# Patient Record
Sex: Male | Born: 1935 | Race: White | Hispanic: No | Smoking: Former smoker
Health system: Southern US, Community
[De-identification: ages and names within clinical notes are randomized; demographics above are authoritative.]

## PROBLEM LIST (undated history)

## (undated) DIAGNOSIS — G4730 Sleep apnea, unspecified: Secondary | ICD-10-CM

## (undated) DIAGNOSIS — M1990 Unspecified osteoarthritis, unspecified site: Secondary | ICD-10-CM

## (undated) DIAGNOSIS — K449 Diaphragmatic hernia without obstruction or gangrene: Secondary | ICD-10-CM

## (undated) DIAGNOSIS — IMO0001 Reserved for inherently not codable concepts without codable children: Secondary | ICD-10-CM

## (undated) DIAGNOSIS — N400 Enlarged prostate without lower urinary tract symptoms: Secondary | ICD-10-CM

## (undated) DIAGNOSIS — K219 Gastro-esophageal reflux disease without esophagitis: Secondary | ICD-10-CM

## (undated) HISTORY — DX: Reserved for inherently not codable concepts without codable children: IMO0001

## (undated) HISTORY — DX: Gastro-esophageal reflux disease without esophagitis: K21.9

## (undated) HISTORY — PX: EYE SURGERY: SHX253

## (undated) HISTORY — DX: Benign prostatic hyperplasia without lower urinary tract symptoms: N40.0

## (undated) HISTORY — DX: Diaphragmatic hernia without obstruction or gangrene: K44.9

---

## 1997-12-12 HISTORY — PX: RETINAL LASER PROCEDURE: SHX2339

## 1998-12-12 HISTORY — PX: SHOULDER SURGERY: SHX246

## 2001-01-22 ENCOUNTER — Ambulatory Visit (HOSPITAL_BASED_OUTPATIENT_CLINIC_OR_DEPARTMENT_OTHER): Admission: RE | Admit: 2001-01-22 | Discharge: 2001-01-22 | Payer: Self-pay | Admitting: Orthopedic Surgery

## 2001-02-19 ENCOUNTER — Ambulatory Visit (HOSPITAL_BASED_OUTPATIENT_CLINIC_OR_DEPARTMENT_OTHER): Admission: RE | Admit: 2001-02-19 | Discharge: 2001-02-19 | Payer: Self-pay | Admitting: *Deleted

## 2008-12-12 HISTORY — PX: CHOLECYSTECTOMY: SHX55

## 2010-01-07 ENCOUNTER — Encounter: Admission: RE | Admit: 2010-01-07 | Discharge: 2010-01-07 | Payer: Self-pay | Admitting: Orthopedic Surgery

## 2010-02-02 ENCOUNTER — Ambulatory Visit: Payer: Self-pay | Admitting: Cardiovascular Disease

## 2011-04-26 NOTE — Letter (Signed)
February 02, 2010    Robert A. Thurston Hole, M.D.  [address]  [place], [place] [address]   RE:  Kyle Torres, Kyle Torres  MRN:  [id number]  /  DOB:  1934/04/13   Dear Dr. Thurston Hole:   I had the pleasure of seeing your patient, Kyle Torres, in  Cardiology Clinic this morning.  As you know, he is a 77 year old  gentleman who recently tore his medial and lateral menisci.  He had an  echocardiogram within the past month which showed a normal ejection  fraction and no significant valvular abnormalities.  His EKG today in my  office was completely normal, and he has been asymptomatic from a  cardiac standpoint.  I feel he can proceed with his knee arthroscopy and  be at low risk for the procedure.  Thank you for the referral of Kyle Torres.  Please contact my office if I can be of any further  assistance.    Sincerely,      Kyle El, MD  Electronically Signed    SGA/MedQ  DD: 02/02/2010  DT: 02/02/2010  Job #: [id number]

## 2011-04-26 NOTE — Assessment & Plan Note (Signed)
[facility]                        [facility] OFFICE NOTE   NAME:WILLIAMSShadow, Schedler                     MRN:          [id number]  DATE:02/02/2010                            DOB:          [birth year]    CHIEF COMPLAINT:  Preoperative evaluation prior to a knee procedure.   HISTORY OF PRESENT ILLNESS:  Mr. Bachus is a 77 year old white male  with past medical history significant for BPH, GERD, asthma who was  presenting for evaluation prior to an arthroscopic right knee procedure.  The patient states that he had recently fallen landing on his right  knee.  A subsequent MRI showed tears of the medial and lateral meniscus.  The patient denies any cardiac history.  He has his cholesterol checked  regularly and he states it is within normal limits.  He states his blood  pressure tends to run a little high and that the systolic blood  pressures is usually in the 150s.  Prior to the knee injury, he was  walking approximately a mile or two each day without any difficulty.  Specifically, he denies any chest discomfort, any recent change in  degree of dyspnea on exertion.  He also denies any significant edema,  dizziness or syncope.   PAST MEDICAL HISTORY:  As above.   SOCIAL HISTORY:  The patient used to smoke, but quit 30 years ago.  He  does not drink alcohol.   FAMILY HISTORY:  Negative for premature coronary artery disease.   ALLERGIES:  No known drug allergies.   MEDICATIONS:  1. Aspirin 81 mg daily.  2. Terazosin 2 mg b.i.d.  3. Omeprazole 20 mg b.i.d.  4. Finasteride 5 mg daily.  5. Multivitamin.  6. Zinc.  7. Asmanex.  8. Flunisolide nasal spray.   REVIEW OF SYSTEMS:  The patient endorses an approximately 9-pound weight  gain over the past couple of months secondary to a decreased physical  activity and increased eating.  Other systems as in HPI, otherwise  negative.   PHYSICAL EXAMINATION:  VITAL SIGNS:  Blood pressure is 158/91  in the  right arm, 151/77 in the left arm, pulse is 71, sating 94% on room air,  he weighs 247 pounds.  GENERAL:  No acute distress.  HEENT:  Normocephalic, atraumatic.  NECK:  Supple.  There is no JVD.  There are no carotid bruits.  HEART:  Regular rate and rhythm without murmur, rub or gallop.  LUNGS:  Clear bilaterally.  ABDOMEN:  Soft, nontender, nondistended.  EXTREMITIES:  Without edema.  SKIN:  Warm and dry.  MUSCULOSKELETAL:  5/5 bilateral upper and lower extremity strength.  NEURO:  Nonfocal.  PSYCHIATRIC:  The patient is appropriate with normal levels of insight.   EKG taken today independently interpreted by myself demonstrates normal  sinus rhythm without any ST or T-wave abnormalities.   Echocardiogram dated January 04, 2010, shows a normal ejection fraction  and no significant valvular regurgitation.  There was some mild LVH and  diastolic dysfunction.   ASSESSMENT AND PLAN:  A 77 year old white male with hypertension who is  not having any symptoms consistent with angina, heart  failure or  arrhythmia.  The patient has had an echocardiogram that demonstrates  normal left ventricular systolic function and his EKG is completely  within normal limits.  At this point, I feel that he can proceed with  his surgery and be at low risk for the procedure.  I spoke with him  about beginning an antihypertensive agent and the patient declines this  at this time and states he would like to make a sincere effort at weight  loss to see if that will have the desired effect on his blood pressure.  Therefore, we will see him back in 1 months' time.  At that time if his  blood pressure has not improved, we will likely start antihypertensive  therapy in the form of HCTZ/lisinopril.     Brayton El, MD  Electronically Signed    SGA/MedQ  DD: 02/02/2010  DT: 02/03/2010  Job #: [id number]

## 2011-04-29 NOTE — Op Note (Signed)
[facility] [place]  Patient:    Kyle Torres, Kyle Torres                     MRN: [id number] Proc. Date: 02/19/01 Adm. Date:  [id number] Disc. Date: [id number] Attending:  Twana First                           Operative Report  PREOPERATIVE DIAGNOSIS:  Right shoulder rotator cuff tear.  POSTOPERATIVE DIAGNOSES: 1. Right shoulder rotator cuff tear. 2. Right shoulder partial labrum tear. 3. Right shoulder impingement. 4. Right shoulder acromioclavicular joint spurring.  PROCEDURE: 1. Right shoulder examination under anesthesia followed by arthroscopic    partial labrum tear debridement. 2. Right shoulder subacromial decompression. 3. Right shoulder arthroscopically assisted rotator cuff repair. 4. Right shoulder distal clavicle excision.  SURGEON:  Dr. Salvatore Marvel.  ASSISTANT:  Oris Drone. Petrarca, P.A.  ANESTHESIA:  General.  OPERATIVE TIME:  One hour and 30 minutes.  COMPLICATIONS:  None.  INDICATIONS FOR PROCEDURE:  Mr. Loving is a 77 year old gentleman who has had significant right shoulder pain for the past year, much worse over the past three months with signs and symptoms consistent with rotator cuff tear versus tendonitis impingement and AC joint spurring and is now to undergo arthroscopy and rotator cuff repair and decompression.  DESCRIPTION OF PROCEDURE:  Mr. Hawker was brought to the operating room on February 19, 2001 and placed on the operating table in supine position.  After adequate level of general anesthesia was obtained, his right shoulder was examined under anesthesia.  He had full range of motion and his shoulder was stable to ligamentous exam.  He was then placed in the beach chair position. His shoulder and arm were prepped using sterile Betadine and draped using sterile technique.  Originally, the arthroscopy was performed to a posterior arthroscopic portal.  The arthroscope with a pump attached was placed into  an anterior portal and arthroscopic probe was placed.  On initial inspection the articular cartilage in the glenohumeral joint was found to be intact.  The anterior labrum had partial tearing, 25-30% superiorly which was debrided. The biceps tendon anchor was intact.  The middle anterior labrum and inferior labrum was intact.  The anterior and glenohumeral ligaments were intact.  The biceps had a significant partial tearing, 30-40% which was debrided, but the rest was intact.  Posterior labrum was intact.  Rotator cuff showed a complete tear of the supraspinatus and a partial tear of the infraspinatus, and this was partially debrided arthroscopically.  A lateral portal was made and a subacromial decompression was carried out, removing 6-8 mm of the under surface of the anterior, anterolateral, and anteromedial acromium and CA ligament release carried out as well.  At this point then, using both the anterior and posterior portal, two separate #1 Panacryl sutures were placed from back to front across the apex of the rotator cuff tear to be tied later. After these were passed, then an Arthrex bioabsorbable suture anchor was placed in the greater tuberosity and then each limb of this suture was passed, one through the anterior and one through the posterior leaflet of the tear. After all the sutures had been passed, then sequentially the tendon to tendon sutures were tied tightly with a sliding locking knot and then the rotator cuff anchor sutures were tied, first anterior and then posterior sutures tied down, thus resecuring this portion of the tear back down to  the greater tuberosity.  After this was done, the shoulder could be brought to a full range of motion with no impingement on the repair.  At this point, the [facility] joint and distal clavicle was exposed.  Significant spurring noted in this joint, and a 6 mm bur was used to resect the distal 4-5 mm of the clavicle and spurring under the  distal clavicle resected as well.  At this point then it was felt that all pathology had been satisfactorily addressed.  A subacromial pain catheter was placed in the subacromial space for postoperative pain.  The arthroscopic portals were then closed with interrupted 3-0 nylon suture after the instruments had been removed.  Sterile dressings were applied in a sling and then the patient wakened and taken to recovery room in stable condition.  FOLLOW-UP CARE:  Mr. Hammonds will be followed overnight at the recovery care center for IV pain control, Norvasc monitoring.  He will be discharged tomorrow on Percocet and Naprosyn.  He will remove his pain pump and bandages in two days per our instructions.  Begin early physical therapy.  He will be seen back in the office in a week for sutures out and follow-up. DD:  02/19/01 TD:  02/19/01 Job: [id number]

## 2011-08-26 ENCOUNTER — Other Ambulatory Visit: Payer: Self-pay | Admitting: Neurosurgery

## 2011-08-26 DIAGNOSIS — M549 Dorsalgia, unspecified: Secondary | ICD-10-CM

## 2011-09-01 ENCOUNTER — Encounter: Payer: Self-pay | Admitting: Cardiovascular Disease

## 2011-09-02 ENCOUNTER — Other Ambulatory Visit: Payer: Self-pay

## 2011-10-10 ENCOUNTER — Encounter (HOSPITAL_COMMUNITY): Payer: Self-pay

## 2011-10-10 ENCOUNTER — Encounter (HOSPITAL_COMMUNITY)
Admission: RE | Admit: 2011-10-10 | Discharge: 2011-10-10 | Disposition: A | Discharge status: Home / Self Care | Payer: Medicare Other | Source: Ambulatory Visit | Attending: Neurosurgery | Admitting: Neurosurgery

## 2011-10-10 HISTORY — DX: Unspecified osteoarthritis, unspecified site: M19.90

## 2011-10-10 HISTORY — DX: Sleep apnea, unspecified: G47.30

## 2011-10-10 LAB — CBC
Hemoglobin: 13.6 g/dL (ref 13.0–17.0)
Platelets: 204 10*3/uL (ref 150–400)
RBC: 4.17 MIL/uL — ABNORMAL LOW (ref 4.22–5.81)
WBC: 8.4 10*3/uL (ref 4.0–10.5)

## 2011-10-10 NOTE — Pre-Procedure Instructions (Signed)
[address] Kyle Torres  10/10/2011   Your procedure is scheduled on:  10/19/11 Wednesday  Report to [facility] at 1015 AM.  Call this number if you have problems the morning of surgery: [phone]   Remember:   Do not eat food:After Midnight.  Do not drink clear liquids: 4 Hours before arrival.0615  Take these medicines the morning of surgery with A SIP OF WATER: Proventil inhaler,asthmanex,omeprazole   Do not wear jewelry, make-up or nail polish.  Do not wear lotions, powders, or perfumes. You may wear deodorant.  Do not shave 48 hours prior to surgery.  Do not bring valuables to the hospital.  Contacts, dentures or bridgework may not be worn into surgery.  Leave suitcase in the car. After surgery it may be brought to your room.  For patients admitted to the hospital, checkout time is 11:00 AM the day of discharge.   Patients discharged the day of surgery will not be allowed to drive home.  Name and phone number of your driver:Kyle Torres -wife [phone]  Special Instructions: CHG Shower Use Special Wash: 1/2 bottle night before surgery and 1/2 bottle morning of surgery.   Please read over the following fact sheets that you were given: Pain Booklet, Coughing and Deep Breathing, Open Heart Packet, MRSA Information and Surgical Site Infection Prevention

## 2011-10-18 ENCOUNTER — Encounter (HOSPITAL_COMMUNITY): Payer: Self-pay

## 2011-10-18 MED ORDER — CEFAZOLIN SODIUM 1-5 GM-% IV SOLN
1.0000 g | INTRAVENOUS | Status: DC
  Filled 2011-10-18: qty 50

## 2011-10-19 ENCOUNTER — Inpatient Hospital Stay (HOSPITAL_COMMUNITY)
Admission: RE | Admit: 2011-10-19 | Discharge: 2011-10-21 | DRG: 472 | Disposition: A | Discharge status: Home / Self Care | Payer: Medicare Other | Source: Ambulatory Visit | Attending: Neurosurgery | Admitting: Neurosurgery

## 2011-10-19 ENCOUNTER — Encounter (HOSPITAL_COMMUNITY): Payer: Self-pay | Admitting: *Deleted

## 2011-10-19 ENCOUNTER — Inpatient Hospital Stay (HOSPITAL_COMMUNITY): Payer: Medicare Other

## 2011-10-19 ENCOUNTER — Other Ambulatory Visit: Payer: Self-pay

## 2011-10-19 ENCOUNTER — Encounter: Payer: Self-pay | Admitting: Cardiovascular Disease

## 2011-10-19 ENCOUNTER — Inpatient Hospital Stay (HOSPITAL_COMMUNITY): Payer: Medicare Other | Admitting: Certified Registered"

## 2011-10-19 ENCOUNTER — Encounter (HOSPITAL_COMMUNITY)
Admission: RE | Disposition: A | Discharge status: Home / Self Care | Payer: Self-pay | Source: Ambulatory Visit | Attending: Neurosurgery

## 2011-10-19 ENCOUNTER — Encounter (HOSPITAL_COMMUNITY): Payer: Self-pay | Admitting: Certified Registered"

## 2011-10-19 DIAGNOSIS — J45909 Unspecified asthma, uncomplicated: Secondary | ICD-10-CM | POA: Diagnosis present

## 2011-10-19 DIAGNOSIS — M4722 Other spondylosis with radiculopathy, cervical region: Secondary | ICD-10-CM

## 2011-10-19 DIAGNOSIS — Z79899 Other long term (current) drug therapy: Secondary | ICD-10-CM

## 2011-10-19 DIAGNOSIS — K219 Gastro-esophageal reflux disease without esophagitis: Secondary | ICD-10-CM | POA: Diagnosis present

## 2011-10-19 DIAGNOSIS — R339 Retention of urine, unspecified: Secondary | ICD-10-CM | POA: Diagnosis not present

## 2011-10-19 DIAGNOSIS — G4730 Sleep apnea, unspecified: Secondary | ICD-10-CM | POA: Diagnosis present

## 2011-10-19 DIAGNOSIS — K449 Diaphragmatic hernia without obstruction or gangrene: Secondary | ICD-10-CM | POA: Diagnosis present

## 2011-10-19 DIAGNOSIS — M4712 Other spondylosis with myelopathy, cervical region: Principal | ICD-10-CM | POA: Diagnosis present

## 2011-10-19 DIAGNOSIS — Z01812 Encounter for preprocedural laboratory examination: Secondary | ICD-10-CM

## 2011-10-19 DIAGNOSIS — M5000 Cervical disc disorder with myelopathy, unspecified cervical region: Secondary | ICD-10-CM | POA: Diagnosis present

## 2011-10-19 DIAGNOSIS — N400 Enlarged prostate without lower urinary tract symptoms: Secondary | ICD-10-CM | POA: Diagnosis present

## 2011-10-19 DIAGNOSIS — Z7982 Long term (current) use of aspirin: Secondary | ICD-10-CM

## 2011-10-19 HISTORY — PX: ANTERIOR CERVICAL DECOMP/DISCECTOMY FUSION: SHX1161

## 2011-10-19 SURGERY — ANTERIOR CERVICAL DECOMPRESSION/DISCECTOMY FUSION 2 LEVELS
Anesthesia: General | Wound class: Clean

## 2011-10-19 MED ORDER — MORPHINE SULFATE 2 MG/ML IJ SOLN: 0.0500 mg/kg | INTRAMUSCULAR | Status: DC | PRN

## 2011-10-19 MED ORDER — ZOLPIDEM TARTRATE 5 MG PO TABS: 5.0000 mg | ORAL_TABLET | Freq: Every evening | ORAL | Status: DC | PRN

## 2011-10-19 MED ORDER — ACETAMINOPHEN 325 MG PO TABS: 650.0000 mg | ORAL_TABLET | ORAL | Status: DC | PRN

## 2011-10-19 MED ORDER — LACTATED RINGERS IV SOLN
INTRAVENOUS | Status: DC | PRN
  Administered 2011-10-19 (×2): via INTRAVENOUS

## 2011-10-19 MED ORDER — SODIUM CHLORIDE 0.9 % IJ SOLN: 3.0000 mL | INTRAMUSCULAR | Status: DC | PRN

## 2011-10-19 MED ORDER — ACETAMINOPHEN 650 MG RE SUPP: 650.0000 mg | RECTAL | Status: DC | PRN

## 2011-10-19 MED ORDER — CEFAZOLIN SODIUM 1-5 GM-% IV SOLN
1.0000 g | Freq: Three times a day (TID) | INTRAVENOUS | Status: AC
  Administered 2011-10-19 – 2011-10-20 (×2): 1 g via INTRAVENOUS
  Filled 2011-10-19 (×2): qty 50

## 2011-10-19 MED ORDER — NEOSTIGMINE METHYLSULFATE 1 MG/ML IJ SOLN
INTRAMUSCULAR | Status: DC | PRN
  Administered 2011-10-19: 5 mg via INTRAVENOUS

## 2011-10-19 MED ORDER — SODIUM CHLORIDE 0.9 % IV SOLN: 250.0000 mL | INTRAVENOUS | Status: DC

## 2011-10-19 MED ORDER — MENTHOL 3 MG MT LOZG
1.0000 | LOZENGE | OROMUCOSAL | Status: DC | PRN
  Administered 2011-10-20: 3 mg via ORAL
  Filled 2011-10-19: qty 9

## 2011-10-19 MED ORDER — LACTATED RINGERS IV SOLN
INTRAVENOUS | Status: DC
  Administered 2011-10-19: 14:00:00 via INTRAVENOUS

## 2011-10-19 MED ORDER — ROCURONIUM BROMIDE 100 MG/10ML IV SOLN
INTRAVENOUS | Status: DC | PRN
  Administered 2011-10-19: 40 mg via INTRAVENOUS
  Administered 2011-10-19: 10 mg via INTRAVENOUS

## 2011-10-19 MED ORDER — DOCUSATE SODIUM 100 MG PO CAPS
100.0000 mg | ORAL_CAPSULE | Freq: Two times a day (BID) | ORAL | Status: DC
  Administered 2011-10-19 – 2011-10-20 (×3): 100 mg via ORAL
  Filled 2011-10-19 (×3): qty 1

## 2011-10-19 MED ORDER — GLYCOPYRROLATE 0.2 MG/ML IJ SOLN
INTRAMUSCULAR | Status: DC | PRN
  Administered 2011-10-19: .1 mg via INTRAVENOUS
  Administered 2011-10-19: .6 mg via INTRAVENOUS

## 2011-10-19 MED ORDER — MIDAZOLAM HCL 2 MG/2ML IJ SOLN: 1.0000 mg | INTRAMUSCULAR | Status: DC | PRN

## 2011-10-19 MED ORDER — MIDAZOLAM HCL 5 MG/5ML IJ SOLN
INTRAMUSCULAR | Status: DC | PRN
  Administered 2011-10-19: 1 mg via INTRAVENOUS

## 2011-10-19 MED ORDER — THROMBIN 5000 UNITS EX KIT
PACK | OROMUCOSAL | Status: DC | PRN
  Administered 2011-10-19: 14:00:00 via TOPICAL

## 2011-10-19 MED ORDER — OXYCODONE-ACETAMINOPHEN 5-325 MG PO TABS
1.0000 | ORAL_TABLET | ORAL | Status: DC | PRN
  Administered 2011-10-20 – 2011-10-21 (×2): 2 via ORAL
  Filled 2011-10-19 (×2): qty 2

## 2011-10-19 MED ORDER — BUPIVACAINE-EPINEPHRINE 0.5% -1:200000 IJ SOLN
INTRAMUSCULAR | Status: DC | PRN
  Administered 2011-10-19: 10 mL

## 2011-10-19 MED ORDER — CEFAZOLIN SODIUM-DEXTROSE 2-3 GM-% IV SOLR
2.0000 g | Freq: Once | INTRAVENOUS | Status: AC
  Administered 2011-10-19: 2 g via INTRAVENOUS
  Filled 2011-10-19: qty 50

## 2011-10-19 MED ORDER — PROPOFOL 10 MG/ML IV EMUL
INTRAVENOUS | Status: DC | PRN
  Administered 2011-10-19: 150 mg via INTRAVENOUS

## 2011-10-19 MED ORDER — MORPHINE SULFATE 4 MG/ML IJ SOLN: 1.0000 mg | INTRAMUSCULAR | Status: DC | PRN

## 2011-10-19 MED ORDER — DIAZEPAM 5 MG PO TABS: 5.0000 mg | ORAL_TABLET | Freq: Four times a day (QID) | ORAL | Status: DC | PRN

## 2011-10-19 MED ORDER — ONDANSETRON HCL 4 MG/2ML IJ SOLN: 4.0000 mg | INTRAMUSCULAR | Status: DC | PRN

## 2011-10-19 MED ORDER — THROMBIN 5000 UNITS EX KIT
PACK | CUTANEOUS | Status: DC | PRN
  Administered 2011-10-19: 5000 [IU] via TOPICAL

## 2011-10-19 MED ORDER — HYDROMORPHONE HCL PF 1 MG/ML IJ SOLN
0.2500 mg | INTRAMUSCULAR | Status: DC | PRN
  Administered 2011-10-19 (×3): 0.5 mg via INTRAVENOUS

## 2011-10-19 MED ORDER — FENTANYL CITRATE 0.05 MG/ML IJ SOLN: 50.0000 ug | INTRAMUSCULAR | Status: DC | PRN

## 2011-10-19 MED ORDER — ONDANSETRON HCL 4 MG/2ML IJ SOLN
INTRAMUSCULAR | Status: DC | PRN
  Administered 2011-10-19: 4 mg via INTRAVENOUS

## 2011-10-19 MED ORDER — EPHEDRINE SULFATE 50 MG/ML IJ SOLN
INTRAMUSCULAR | Status: DC | PRN
  Administered 2011-10-19 (×2): 10 mg via INTRAVENOUS

## 2011-10-19 MED ORDER — PHENOL 1.4 % MT LIQD: 1.0000 | OROMUCOSAL | Status: DC | PRN

## 2011-10-19 MED ORDER — FENTANYL CITRATE 0.05 MG/ML IJ SOLN
INTRAMUSCULAR | Status: DC | PRN
  Administered 2011-10-19: 50 ug via INTRAVENOUS
  Administered 2011-10-19: 100 ug via INTRAVENOUS
  Administered 2011-10-19: 50 ug via INTRAVENOUS

## 2011-10-19 SURGICAL SUPPLY — 60 items
APL SKNCLS STERI-STRIP NONHPOA (GAUZE/BANDAGES/DRESSINGS) ×1
BAG DECANTER FOR FLEXI CONT (MISCELLANEOUS) ×2 IMPLANT
BENZOIN TINCTURE PRP APPL 2/3 (GAUZE/BANDAGES/DRESSINGS) ×3 IMPLANT
BIT DRILL SM SPINE QC 14 (BIT) ×1 IMPLANT
BLADE SURG 15 STRL LF DISP TIS (BLADE) ×1 IMPLANT
BLADE SURG 15 STRL SS (BLADE) ×2
BLADE ULTRA TIP 2M (BLADE) ×2 IMPLANT
BRUSH SCRUB EZ PLAIN DRY (MISCELLANEOUS) ×2 IMPLANT
BUR BARREL STRAIGHT FLUTE 4.0 (BURR) ×2 IMPLANT
BUR MATCHSTICK NEURO 3.0 LAGG (BURR) ×2 IMPLANT
CANISTER SUCTION 2500CC (MISCELLANEOUS) ×2 IMPLANT
CLOTH BEACON ORANGE TIMEOUT ST (SAFETY) ×2 IMPLANT
CONT SPEC 4OZ CLIKSEAL STRL BL (MISCELLANEOUS) ×2 IMPLANT
COVER MAYO STAND STRL (DRAPES) ×2 IMPLANT
DEVICE FUSION VIST S 14X14X6MM (Trauma) IMPLANT
DRAPE LAPAROTOMY 100X72 PEDS (DRAPES) ×2 IMPLANT
DRAPE MICROSCOPE LEICA (MISCELLANEOUS) ×1 IMPLANT
DRAPE POUCH INSTRU U-SHP 10X18 (DRAPES) ×2 IMPLANT
DRAPE SURG 17X23 STRL (DRAPES) ×4 IMPLANT
ELECT REM PT RETURN 9FT ADLT (ELECTROSURGICAL) ×2
ELECTRODE REM PT RTRN 9FT ADLT (ELECTROSURGICAL) ×1 IMPLANT
GAUZE SPONGE 4X4 16PLY XRAY LF (GAUZE/BANDAGES/DRESSINGS) IMPLANT
GLOVE BIO SURGEON STRL SZ8.5 (GLOVE) ×2 IMPLANT
GLOVE BIOGEL PI IND STRL 8 (GLOVE) IMPLANT
GLOVE BIOGEL PI INDICATOR 8 (GLOVE) ×2
GLOVE ECLIPSE 7.5 STRL STRAW (GLOVE) ×4 IMPLANT
GLOVE EXAM NITRILE LRG STRL (GLOVE) IMPLANT
GLOVE EXAM NITRILE MD LF STRL (GLOVE) IMPLANT
GLOVE EXAM NITRILE XL STR (GLOVE) IMPLANT
GLOVE EXAM NITRILE XS STR PU (GLOVE) IMPLANT
GLOVE SS BIOGEL STRL SZ 8 (GLOVE) ×1 IMPLANT
GLOVE SUPERSENSE BIOGEL SZ 8 (GLOVE) ×1
GOWN BRE IMP SLV AUR LG STRL (GOWN DISPOSABLE) IMPLANT
GOWN BRE IMP SLV AUR XL STRL (GOWN DISPOSABLE) ×3 IMPLANT
GOWN STRL REIN 2XL LVL4 (GOWN DISPOSABLE) ×1 IMPLANT
KIT BASIN OR (CUSTOM PROCEDURE TRAY) ×2 IMPLANT
KIT ROOM TURNOVER OR (KITS) ×2 IMPLANT
MARKER SKIN DUAL TIP RULER LAB (MISCELLANEOUS) ×2 IMPLANT
NDL SPNL 18GX3.5 QUINCKE PK (NEEDLE) ×1 IMPLANT
NEEDLE HYPO 22GX1.5 SAFETY (NEEDLE) ×2 IMPLANT
NEEDLE SPNL 18GX3.5 QUINCKE PK (NEEDLE) ×2 IMPLANT
NS IRRIG 1000ML POUR BTL (IV SOLUTION) ×2 IMPLANT
PACK LAMINECTOMY NEURO (CUSTOM PROCEDURE TRAY) ×2 IMPLANT
PEEK VISTA 14X14X8MM (Peek) ×1 IMPLANT
PIN DISTRACTION 14MM (PIN) ×4 IMPLANT
PLATE ANT CERV XTEND 2 LV 28 (Plate) ×1 IMPLANT
PUTTY 5ML ACTIFUSE ABX (Putty) ×1 IMPLANT
RUBBERBAND STERILE (MISCELLANEOUS) ×2 IMPLANT
SCREW XTD VAR 4.2 SELF TAP (Screw) ×6 IMPLANT
SPONGE GAUZE 4X4 12PLY (GAUZE/BANDAGES/DRESSINGS) ×2 IMPLANT
SPONGE INTESTINAL PEANUT (DISPOSABLE) ×4 IMPLANT
SPONGE SURGIFOAM ABS GEL SZ50 (HEMOSTASIS) ×2 IMPLANT
STRIP CLOSURE SKIN 1/2X4 (GAUZE/BANDAGES/DRESSINGS) ×2 IMPLANT
SUT VIC AB 3-0 SH 8-18 (SUTURE) ×3 IMPLANT
SYR 20ML ECCENTRIC (SYRINGE) ×2 IMPLANT
TAPE HYPAFIX 4 X10 (GAUZE/BANDAGES/DRESSINGS) ×1 IMPLANT
TOWEL OR 17X24 6PK STRL BLUE (TOWEL DISPOSABLE) ×2 IMPLANT
TOWEL OR 17X26 10 PK STRL BLUE (TOWEL DISPOSABLE) ×2 IMPLANT
VISTA S O 14X14X6MM (Trauma) ×2 IMPLANT
WATER STERILE IRR 1000ML POUR (IV SOLUTION) ×2 IMPLANT

## 2011-10-19 NOTE — Anesthesia Postprocedure Evaluation (Signed)
  Anesthesia Post-op Note  Patient: Kyle Torres  Procedure(s) Performed:  ANTERIOR CERVICAL DECOMPRESSION/DISCECTOMY FUSION 2 LEVELS - CERVICAL FIVE-SIX, SIX-SEVEN ANTERIOR CERVICAL DECOMPRESSION WITH FUSION AND  INTERBODY PROTHESIS PLATING  Patient Location: PACU  Anesthesia Type: General  Level of Consciousness: awake, alert  and oriented  Airway and Oxygen Therapy: Patient Spontanous Breathing and Patient connected to nasal cannula oxygen  Post-op Pain: none  Post-op Assessment: Post-op Vital signs reviewed and Respiratory Function Stable  Post-op Vital Signs: stable  Complications: No apparent anesthesia complications

## 2011-10-19 NOTE — Progress Notes (Signed)
Pt placed on cpap 6 cm H2O for the night. Pt wearing nasal mask & tolerating well at this time.

## 2011-10-19 NOTE — Transfer of Care (Signed)
Immediate Anesthesia Transfer of Care Note  Patient: Kyle Torres  Procedure(s) Performed:  ANTERIOR CERVICAL DECOMPRESSION/DISCECTOMY FUSION 2 LEVELS - CERVICAL FIVE-SIX, SIX-SEVEN ANTERIOR CERVICAL DECOMPRESSION WITH FUSION AND  INTERBODY PROTHESIS PLATING  Patient Location: PACU  Anesthesia Type: General  Level of Consciousness: awake, alert  and oriented  Airway & Oxygen Therapy: Patient Spontanous Breathing and Patient connected to nasal cannula oxygen  Post-op Assessment: Report given to PACU RN, Post -op Vital signs reviewed and stable, Patient moving all extremities and Patient able to stick tongue midline  Post vital signs: Reviewed and stable  Complications: No apparent anesthesia complications

## 2011-10-19 NOTE — Anesthesia Preprocedure Evaluation (Addendum)
Anesthesia Evaluation  Patient identified by MRN, date of birth, ID band Patient awake    Reviewed: Allergy & Precautions, H&P , NPO status   Airway       Dental  (+) Upper Dentures   Pulmonary asthma (controlled on inhalers) , sleep apnea (cpap) and Continuous Positive Airway Pressure Ventilation ,          Cardiovascular Regular Normal    Neuro/Psych  Neuromuscular disease    GI/Hepatic hiatal hernia, GERD-  Controlled,  Endo/Other    Renal/GU      Musculoskeletal   Abdominal (+)  Abdomen: soft. Bowel sounds: normal.  Peds  Hematology   Anesthesia Other Findings   Reproductive/Obstetrics                         Anesthesia Physical Anesthesia Plan  ASA: III  Anesthesia Plan: General   Post-op Pain Management:    Induction: Intravenous  Airway Management Planned: Oral ETT  Additional Equipment:   Intra-op Plan:   Post-operative Plan: Extubation in OR  Informed Consent: I have reviewed the patients History and Physical, chart, labs and discussed the procedure including the risks, benefits and alternatives for the proposed anesthesia with the patient or authorized representative who has indicated his/her understanding and acceptance.   Dental advisory given  Plan Discussed with: CRNA  Anesthesia Plan Comments:         Anesthesia Quick Evaluation

## 2011-10-19 NOTE — Preoperative (Signed)
Beta Blockers   Reason not to administer Beta Blockers:Not Applicable 

## 2011-10-19 NOTE — H&P (Signed)
Subjective: Patient is a 77 y.o. male who complains of neck and arm pain. He has failed medical management and was worked up with a cervical MRI. The MRI demonstrated significant spondylosis stenosis etc. at C5-6 and C6-7.  Patient Active Problem List  Diagnoses Date Noted  . Cervical spondylosis with radiculopathy 10/19/2011   Past Medical History  Diagnosis Date  . Asthma   . Reflux   . BPH (benign prostatic hypertrophy)   . GERD (gastroesophageal reflux disease)   . Arthritis   . Hiatal hernia neck pain and back pain   . Sleep apnea study was 62yrs ago at [facility]     Past Surgical History  Procedure Date  . Retinal laser procedure 1999    Torn retina, laser repair  . Shoulder surgery 2000  . Cholecystectomy 2010  . Eye surgery     Prescriptions prior to admission  Medication Sig Dispense Refill  . ALPRAZolam (XANAX) 0.5 MG tablet Take 0.5 mg by mouth at bedtime as needed. FOR SLEEP       . aspirin 81 MG tablet Take 81 mg by mouth daily.       . finasteride (PROSCAR) 5 MG tablet Take 5 mg by mouth daily.       Marland Kitchen FLUNISOLIDE, NASAL, NA Place 2 sprays into the nose 2 (two) times daily.       . meclizine (ANTIVERT) 25 MG tablet Take 25 mg by mouth 3 (three) times daily as needed. FOR VERTIGO       . mometasone (ASMANEX) 220 MCG/INH inhaler Inhale 1 puff into the lungs daily.       . Multiple Vitamin (MULTIVITAMIN) tablet Take 1 tablet by mouth daily.       Marland Kitchen omeprazole (PRILOSEC) 20 MG capsule Take 20 mg by mouth 2 (two) times daily.       . STUDY MEDICATION Take 1 tablet by mouth daily. VITAMIN D3 2000UNITS OR PLACEBO       . STUDY MEDICATION Take 1 tablet by mouth daily. FISH OIL/EPA +DHA 1MG /840MG   OR PLACEBO       . terazosin (HYTRIN) 5 MG capsule Take 5 mg by mouth 2 (two) times daily.        Marland Kitchen albuterol (PROVENTIL HFA;VENTOLIN HFA) 108 (90 BASE) MCG/ACT inhaler Inhale 1 puff into the lungs every 6 (six) hours as needed. FOR SHORTNESS OF BREATH       . hydroxypropyl  methylcellulose (ISOPTO TEARS) 2.5 % ophthalmic solution Place 1 drop into both eyes 4 (four) times daily as needed. FOR DRY EYES       . Multiple Vitamins-Minerals (ZINC PO) Take by mouth.        . terazosin (HYTRIN) 2 MG capsule Take 2 mg by mouth 2 (two) times daily.        No Known Allergies  History  Substance Use Topics  . Smoking status: Former Smoker    Quit date: 12/13/1975  . Smokeless tobacco: Former Neurosurgeon    Quit date: 10/10/1983  . Alcohol Use: No    Family History  Problem Relation Age of Onset  . Cirrhosis Mother   . Lung cancer Father   . Coronary artery disease Neg Hx   . Cancer Sister     Review of Systems Review of Systems is negative except as above.  Objective: Vital signs in last 24 hours: Temp:  [97.7 F (36.5 C)] 97.7 F (36.5 C) (11/07 1121) Pulse Rate:  [71] 71  (11/07 1121) Resp:  [  20] 20  (11/07 1121) BP: (136)/(77) 136/77 mmHg (11/07 1121) SpO2:  [95 %] 95 % (11/07 1121) Imaging studies: I reviewed the patient's cervical MRI performed at [facility] on 09/22/2011. It demonstrates the patient has multilevel spondylosis and stenosis. At C3-4 there is left neuroforaminal stenosis largely secondary to facet arthropathy. At C3-4 the patient has left greater than right neuroforaminal stenosis secondary to facet arthropathy. At C4-5 he has bilateral neuroforaminal stenosis. At C5-6 he has more significant central spinal stenosis and bilateral neural frontal stenosis. The same is seen at C6-7. At C7-T1 has mild spondylosis. Exam general: A pleasant healthy-appearing 77 year old white male in no apparent distress.  HEENT: Normocephalic atraumatic pupils equal round reactive to light. Extraocular  muscles are intact. Oropharynx benign.  Thorax: Symmetric  Lungs clear to auscultation.  Heart regular rate and rhythm.  Abdomen soft nontender.  Extremities no obvious deformities good range of motion. Back exam is unremarkable.  Neurologic exam the  patient is alert and oriented x3. Cranial nerves II through XII were grossly intact bilaterally. Vision and hearing are grossly normal bilaterally. Motor strength is 5 over 5 in the bilateral deltoids biceps triceps and grip wrist extensor interosseous psoas quadriceps gastrocnemius extensor hallucis longus. Cerebellar exam is intact to rapid only movements of the upper extremity bilaterally. Sensory exam demonstrates some decreased light touch sensation his right leg otherwise unremarkable. Deep tendon reflexes are symmetric.  Data Review BMP:  No results found for this basename: Glucose, sodium, potassium, Chloride, CO2, BUN, Creatinine, Calcium    Assessment/Plan: C5-6 and C6-7 spondylosis stenosis cervicalgia cervical radiculopathy cervical myelopathy: I discussed the various treatment options with the patient occluding doing nothing continued continued medical management physical therapy chiropractic care steroid injections and surgery. Told him that he has multilevel spondylosis and stenosis I think the most significant low levels of narrowing are at C5-6 and C7 causing left C6 and/or C7 radiculopathy.  I described a surgical treatment option of the C5-6 and C6-7 anterior cervicectomy interbody arthrodesis prosthesis and intracervical plate. I described the risk benefits and alternatives surgery as well as the expected outcome. Advanced all the patient's questions. Patient has decided proceed with surgery.   Sharmaine Bain D 10/19/2011 1:26 PM

## 2011-10-19 NOTE — Addendum Note (Signed)
Addendum  created 10/19/11 1721 by Cecilie Kicks   Modules edited:Charges VN

## 2011-10-19 NOTE — Op Note (Signed)
Brief history: The patient is a 77 year old white male who was suffered from chronic neck shoulder and arm pain. He is failed medical management and was worked up with a cervical MRI. This demonstrated the patient had multilevel cervical disc degeneration and spondylosis with stenosis. I discussed the various treatment options with the patient including surgery. The patient has weighed the risks benefits and alternatives early in the [facility] with a C5-6 and C6-7 intracervical discectomy fusion and plating.  Preoperative diagnosis: C5-6 and C6-7 degenerative disease, spondylosis, stenosis, cervical radiculopathy and myelopathy.  Postoperative diagnosis: The same  Procedure is C5-6 and C6-7 extensive anterior cervicectomy: C5-6 and C7 anterior interbody arthrodesis with local morselized autograph bone and active fusion bone graft extender; insertion of interbody prosthesis at C5-6 and C6-7 (Zimmer peak interbody prosthesis); intracervical plating C5-C7 with globus titanium plate and screws  Surgeon: Dr. Delma Officer  Asst.: Dr. Shirlean Kelly   Anesthesia: Gen. endotracheal  Estimated blood loss: 100 cc  Drains none  Complications none  Description of procedure: The patient was brought to the operating room by the anesthesia team. General endotracheal anesthesia was induced. The patient remained in the supine position. A roll was placed under shoulders to keep his neck in a neutral position. His intracervical region was then prepared with Betadine scrub and Betadine solution. Sterile drapes were applied. The area to be incised was injected with Marcaine with epinephrine solution. I then used a scalpel make a transverse incision the patient's left anterior neck. The use of Metzenbaum scissors to divide the platysmal muscle. I dissected medial to sternocleidomastoid muscle jugular vein and carotid artery. Identified the esophagus retracted medially. We used a Kitner swapped to clear soft tissue from  intracervical spine. We then inserted a bent spinal needle into the upper exposed intravertebral disc space. We obtained intraoperative radiograph to confirm our location.  We then used the electrocautery to detach the medial border of all lung was cobbled most bothered from from the C5-6 and C6-7  Intravertebral disc spaces. We then inserted the Caspar self-retaining retractor underneath the longus colli muscle bilaterally. We began the decompression at C6-7. I incised C6-7 intravertebral disc with 15 scalpel. Then performed a partial innertube vertebral discectomy with the pituitary forceps. I then inserted distraction screws into the vertebral bias at C6 and C7. We then distracted the interspace. I then used the high-speed drill to decorticate the vertebral endplates at C6 and C7, 2 drill away the remainder of the intervertebral disc at C6-7, 2 drill away some posterior spondylosis, and to thin out the posterior longitudinal ligament. I then incised the ligament with the arachnoid knife. I then removed the ligament with the Kerrison punches undercutting the vertebral endplates at C6-7 decompressing the thecal sac. I then performed foraminotomies about the bilateral C7 nerve roots. This completed the decompression at this level.    We didn't repeat his procedure is C5-6 in the analogous fashion. We decompressed the thecal sac and the bile C6 nerve roots.  We now turned attention to the arthrodesis. We used the trial spacers to size the interspaces. We prefilled in the prosthesis with local morselized autograft bone and active fusion bone graft extender. We inserted the prosthesis into distracted interspaces at C5-6 and C6-7. This completed the arthrodesis.  We now turned attention to the anterior spinal instrumentation. We selected the appropriate length plate. We placed it along the anterior aspect of the vertebral bodies from C5-C7. We then drilled 14 mm holes C56 and 7. We then secured  the plate to the  vertebral bodies by placing 2 14 mm screws at C5-C6 and C7. We got good bony purchase. We then attained intraoperative radiograph which demonstrated good position of the instrumentation. We therefore she secured the screws to the plate locking each cam. This completed the instrumentation.  We then obtained hemostasis using bipolar electrocautery. We then irrigated the wound out with bacitracin solution. We then removed the retractor. We inspected the esophagus for any damage. There was none apparent. We then reapproximated the patient's platysmal muscle with interrupted 3-0 Vicryl suture. We then reapproximated patient's substage tissue with interrupted 3-0 Vicryl suture. We reapproximated patient's skin with Steri-Strips and benzoin. The wound was coated with bacitracin ointment. A sterile dressing was applied. The drapes were removed. The patient was soaked with extubated by the anesthesia team. And then transported to the post anesthesia care unit in stable condition. All sponge instrument and needle counts were correct at the end this case.

## 2011-10-19 NOTE — Progress Notes (Signed)
Subjective:  The patient looks well. He is alert and pleasant.  Objective: Vital signs in last 24 hours: Temp:  [97.6 F (36.4 C)-97.7 F (36.5 C)] 97.7 F (36.5 C) (11/07 1804) Pulse Rate:  [51-71] 51  (11/07 1800) Resp:  [11-20] 11  (11/07 1800) BP: (136)/(77) 136/77 mmHg (11/07 1121) SpO2:  [95 %-98 %] 98 % (11/07 1800)  Intake/Output from previous day:   Intake/Output this shift: Total I/O In: 1700 [I.V.:1700] Out: 125 [Blood:125]  Physical exam the patient is alert and oriented. He is moving all his extremities well. His wound is soft and flat the dressing is dry there is no evidence of hematoma or shift.  Lab Results: No results found for this basename: WBC:2,HGB:2,HCT:2,PLT:2 in the last 72 hours BMET No results found for this basename: NA:2,K:2,CL:2,CO2:2,GLUCOSE:2,BUN:2,CREATININE:2,CALCIUM:2 in the last 72 hours  Studies/Results: Dg Chest 2 View  10/19/2011  *RADIOLOGY REPORT*  Clinical Data: Cervical stenosis, preop  CHEST - 2 VIEW  Comparison: CT 02/16/2011  Findings: Persistent elevation of the left diaphragmatic leaflet with some linear scarring or atelectasis in the basilar segments of the left lower lobe.  Right lung clear.  Heart size normal. Atheromatous aortic arch.  Minimal spondylitic changes in the mid thoracic spine.  No effusion.  IMPRESSION:  1.  Stable elevation of left hemidiaphragm with adjacent atelectasis/scarring.  Original Report Authenticated By: Osa Craver, M.D.   Dg Cervical Spine 2-3 Views Neuro Or 34  10/19/2011  *RADIOLOGY REPORT*  Clinical Data: Intraoperative localization  CERVICAL SPINE - 2-3 VIEW  Comparison: Cervical spine MRI - 09/22/2011  Findings: Two intraoperative lateral radiographs of the cervical spine provided for review.  Radiograph labeled #1 demonstrates a marking needle overlying the anterior aspect of the C4 - C5 intervertebral disc space. Endotracheal tube overlies tracheal air column with tip excluded from view.   Radiograph label #2 demonstrate sequela of interval C5 - C7 ACDF, though the inferior aspect of the hardware is excluded from view secondary to overlying osseous and soft tissue structures.  A radiopaque sponge is seen overlying the prevertebral soft tissues anterior to the C4 vertebral body.  Endotracheal tube overlies tracheal air column, tip excluded from view.  IMPRESSION: Intraoperative radiographs of the cervical spine as above.  Original Report Authenticated By: Waynard Reeds, M.D.    Assessment/Plan: Patient is doing well.  LOS: 0 days     Devean Skoczylas D 10/19/2011, 6:17 PM

## 2011-10-20 MED ORDER — TAMSULOSIN HCL 0.4 MG PO CAPS
0.8000 mg | ORAL_CAPSULE | Freq: Every day | ORAL | Status: DC
  Administered 2011-10-20: 0.8 mg via ORAL
  Filled 2011-10-20 (×2): qty 2

## 2011-10-20 NOTE — Progress Notes (Signed)
Subjective:  The patient looks well. He complains of urinary retention.  Objective: Vital signs in last 24 hours: Temp:  [97.4 F (36.3 C)-99.4 F (37.4 C)] 99.4 F (37.4 C) (11/08 1600) Pulse Rate:  [51-94] 94  (11/08 1600) Resp:  [11-20] 18  (11/08 1600) BP: (147-174)/(71-96) 154/71 mmHg (11/08 1600) SpO2:  [92 %-98 %] 92 % (11/08 1600)  Intake/Output from previous day: 11/07 0701 - 11/08 0700 In: 2286 [P.O.:240; I.V.:1996; IV Piggyback:50] Out: 125 [Blood:125] Intake/Output this shift: Total I/O In: 600 [P.O.:600] Out: 800 [Urine:800]  Physical exam the patient's cervical dressing is soft dry and flat. Ears no evidence of hematoma shift etc.  The patient is moving all extremities well his strength is normal.  Lab Results: No results found for this basename: WBC:2,HGB:2,HCT:2,PLT:2 in the last 72 hours BMET No results found for this basename: NA:2,K:2,CL:2,CO2:2,GLUCOSE:2,BUN:2,CREATININE:2,CALCIUM:2 in the last 72 hours  Studies/Results: Dg Chest 2 View  10/19/2011  *RADIOLOGY REPORT*  Clinical Data: Cervical stenosis, preop  CHEST - 2 VIEW  Comparison: CT 02/16/2011  Findings: Persistent elevation of the left diaphragmatic leaflet with some linear scarring or atelectasis in the basilar segments of the left lower lobe.  Right lung clear.  Heart size normal. Atheromatous aortic arch.  Minimal spondylitic changes in the mid thoracic spine.  No effusion.  IMPRESSION:  1.  Stable elevation of left hemidiaphragm with adjacent atelectasis/scarring.  Original Report Authenticated By: Osa Craver, M.D.   Dg Cervical Spine 2-3 Views Neuro Or 34  10/19/2011  *RADIOLOGY REPORT*  Clinical Data: Intraoperative localization  CERVICAL SPINE - 2-3 VIEW  Comparison: Cervical spine MRI - 09/22/2011  Findings: Two intraoperative lateral radiographs of the cervical spine provided for review.  Radiograph labeled #1 demonstrates a marking needle overlying the anterior aspect of the C4 -  C5 intervertebral disc space. Endotracheal tube overlies tracheal air column with tip excluded from view.  Radiograph label #2 demonstrate sequela of interval C5 - C7 ACDF, though the inferior aspect of the hardware is excluded from view secondary to overlying osseous and soft tissue structures.  A radiopaque sponge is seen overlying the prevertebral soft tissues anterior to the C4 vertebral body.  Endotracheal tube overlies tracheal air column, tip excluded from view.  IMPRESSION: Intraoperative radiographs of the cervical spine as above.  Original Report Authenticated By: Waynard Reeds, M.D.    Assessment/Plan: Postop day #1: The patient is doing well neurologically  Urinary retention: We'll start the patient back on his Flomax patient is going to have a bladder scan and the catheterized.  LOS: 1 day     Tisha Cline D 10/20/2011, 5:00 PM

## 2011-10-21 MED ORDER — TAMSULOSIN HCL 0.4 MG PO CAPS: 0.4000 mg | ORAL_CAPSULE | Freq: Every day | ORAL | Status: DC

## 2011-10-21 MED ORDER — DIAZEPAM 5 MG PO TABS: 5.0000 mg | ORAL_TABLET | Freq: Four times a day (QID) | ORAL | Status: AC | PRN

## 2011-10-21 MED ORDER — DSS 100 MG PO CAPS: 100.0000 mg | ORAL_CAPSULE | Freq: Two times a day (BID) | ORAL | Status: AC

## 2011-10-21 MED ORDER — OXYCODONE-ACETAMINOPHEN 10-325 MG PO TABS: 1.0000 | ORAL_TABLET | ORAL | Status: AC | PRN

## 2011-10-21 NOTE — Discharge Summary (Signed)
Physician Discharge Summary  Patient ID: Kyle Torres MRN: [id number] DOB/AGE: 1934-04-12 77 y.o.  Admit date: 10/19/2011 Discharge date: 10/21/2011  Admission Diagnoses:  Discharge Diagnoses:  Principal Problem:  *Cervical spondylosis with radiculopathy   Discharged Condition: good  Hospital Course: The patient was admitted to [facility] on 10/19/2011. On the day of admission he underwent a C5-6 and C6-7 anterior cervical discectomy fusion and plating. The surgery were well.  The patient's postoperative course was remarkable only for urinary retention. This required additional days hospital stay. He was in and out catheterized. The urinary retention resolved.  Consults: None Significant Diagnostic Studies: None Treatments: As above Discharge Exam: Blood pressure 152/86, pulse 87, temperature 98.6 F (37 C), temperature source Oral, resp. rate 16, SpO2 95.00%. The patient is alert and oriented. He is motor strength is normal. His wound is healing well. The dressing is dry and intact. There is no evidence of hematoma.  Disposition: Home   Current Discharge Medication List    START taking these medications   Details  diazepam (VALIUM) 5 MG tablet Take 1 tablet (5 mg total) by mouth every 6 (six) hours as needed. Qty: 50 tablet, Refills: 1    docusate sodium 100 MG CAPS Take 100 mg by mouth 2 (two) times daily. Qty: 60 capsule, Refills: 1    oxyCODONE-acetaminophen (PERCOCET) 10-325 MG per tablet Take 1 tablet by mouth every 4 (four) hours as needed for pain. Qty: 100 tablet, Refills: 0    Tamsulosin HCl (FLOMAX) 0.4 MG CAPS Take 1 capsule (0.4 mg total) by mouth daily. Qty: 30 capsule, Refills: 1      CONTINUE these medications which have NOT CHANGED   Details  ALPRAZolam (XANAX) 0.5 MG tablet Take 0.5 mg by mouth at bedtime as needed. FOR SLEEP     aspirin 81 MG tablet Take 81 mg by mouth daily.     finasteride (PROSCAR) 5 MG tablet Take 5 mg by  mouth daily.     FLUNISOLIDE, NASAL, NA Place 2 sprays into the nose 2 (two) times daily.     meclizine (ANTIVERT) 25 MG tablet Take 25 mg by mouth 3 (three) times daily as needed. FOR VERTIGO     mometasone (ASMANEX) 220 MCG/INH inhaler Inhale 1 puff into the lungs daily.     Multiple Vitamin (MULTIVITAMIN) tablet Take 1 tablet by mouth daily.     omeprazole (PRILOSEC) 20 MG capsule Take 20 mg by mouth 2 (two) times daily.     !! STUDY MEDICATION Take 1 tablet by mouth daily. VITAMIN D3 2000UNITS OR PLACEBO     !! STUDY MEDICATION Take 1 tablet by mouth daily. FISH OIL/EPA +DHA 1MG /840MG   OR PLACEBO     terazosin (HYTRIN) 5 MG capsule Take 5 mg by mouth 2 (two) times daily.      albuterol (PROVENTIL HFA;VENTOLIN HFA) 108 (90 BASE) MCG/ACT inhaler Inhale 1 puff into the lungs every 6 (six) hours as needed. FOR SHORTNESS OF BREATH    hydroxypropyl methylcellulose (ISOPTO TEARS) 2.5 % ophthalmic solution Place 1 drop into both eyes 4 (four) times daily as needed. FOR DRY EYES      !! - Potential duplicate medications found. Please discuss with provider.    STOP taking these medications     Multiple Vitamins-Minerals (ZINC PO)        Follow-up Information    Follow up with Jaeshawn Silvio D. Call in 1 week.   Contact information:   [address] [employer]  [address] [place] [address] [phone]          Signed: Cristi Loron 10/21/2011, 7:04 AM

## 2011-10-26 ENCOUNTER — Encounter (HOSPITAL_COMMUNITY): Payer: Self-pay | Admitting: Neurosurgery

## 2014-12-13 DIAGNOSIS — R509 Fever, unspecified: Secondary | ICD-10-CM | POA: Diagnosis not present

## 2014-12-13 DIAGNOSIS — J0100 Acute maxillary sinusitis, unspecified: Secondary | ICD-10-CM | POA: Diagnosis not present

## 2015-01-08 DIAGNOSIS — K219 Gastro-esophageal reflux disease without esophagitis: Secondary | ICD-10-CM | POA: Diagnosis not present

## 2015-01-08 DIAGNOSIS — J4520 Mild intermittent asthma, uncomplicated: Secondary | ICD-10-CM | POA: Diagnosis not present

## 2015-01-08 DIAGNOSIS — N400 Enlarged prostate without lower urinary tract symptoms: Secondary | ICD-10-CM | POA: Diagnosis not present

## 2015-01-08 DIAGNOSIS — M791 Myalgia: Secondary | ICD-10-CM | POA: Diagnosis not present

## 2015-03-17 DIAGNOSIS — H903 Sensorineural hearing loss, bilateral: Secondary | ICD-10-CM | POA: Diagnosis not present

## 2015-03-17 DIAGNOSIS — H608X3 Other otitis externa, bilateral: Secondary | ICD-10-CM | POA: Diagnosis not present

## 2015-08-05 DIAGNOSIS — Z125 Encounter for screening for malignant neoplasm of prostate: Secondary | ICD-10-CM | POA: Diagnosis not present

## 2015-08-06 DIAGNOSIS — Z0000 Encounter for general adult medical examination without abnormal findings: Secondary | ICD-10-CM | POA: Diagnosis not present

## 2015-08-06 DIAGNOSIS — Z6833 Body mass index (BMI) 33.0-33.9, adult: Secondary | ICD-10-CM | POA: Diagnosis not present

## 2015-08-06 DIAGNOSIS — E663 Overweight: Secondary | ICD-10-CM | POA: Diagnosis not present

## 2015-08-06 DIAGNOSIS — Z23 Encounter for immunization: Secondary | ICD-10-CM | POA: Diagnosis not present

## 2015-08-06 DIAGNOSIS — Z1211 Encounter for screening for malignant neoplasm of colon: Secondary | ICD-10-CM | POA: Diagnosis not present

## 2015-09-08 DIAGNOSIS — L570 Actinic keratosis: Secondary | ICD-10-CM | POA: Diagnosis not present

## 2015-09-08 DIAGNOSIS — L578 Other skin changes due to chronic exposure to nonionizing radiation: Secondary | ICD-10-CM | POA: Diagnosis not present

## 2015-09-08 DIAGNOSIS — L821 Other seborrheic keratosis: Secondary | ICD-10-CM | POA: Diagnosis not present

## 2015-09-08 DIAGNOSIS — B351 Tinea unguium: Secondary | ICD-10-CM | POA: Diagnosis not present

## 2015-09-09 DIAGNOSIS — G4733 Obstructive sleep apnea (adult) (pediatric): Secondary | ICD-10-CM | POA: Diagnosis not present

## 2015-11-23 DIAGNOSIS — J060 Acute laryngopharyngitis: Secondary | ICD-10-CM | POA: Diagnosis not present

## 2015-11-23 DIAGNOSIS — Z23 Encounter for immunization: Secondary | ICD-10-CM | POA: Diagnosis not present

## 2015-12-01 DIAGNOSIS — J4531 Mild persistent asthma with (acute) exacerbation: Secondary | ICD-10-CM | POA: Diagnosis not present

## 2015-12-01 DIAGNOSIS — R05 Cough: Secondary | ICD-10-CM | POA: Diagnosis not present

## 2015-12-01 DIAGNOSIS — J208 Acute bronchitis due to other specified organisms: Secondary | ICD-10-CM | POA: Diagnosis not present

## 2015-12-09 DIAGNOSIS — G4733 Obstructive sleep apnea (adult) (pediatric): Secondary | ICD-10-CM | POA: Diagnosis not present

## 2016-03-02 DIAGNOSIS — G4733 Obstructive sleep apnea (adult) (pediatric): Secondary | ICD-10-CM | POA: Diagnosis not present

## 2016-05-24 ENCOUNTER — Ambulatory Visit (INDEPENDENT_AMBULATORY_CARE_PROVIDER_SITE_OTHER): Payer: PPO | Admitting: Pulmonary Disease

## 2016-05-24 ENCOUNTER — Encounter: Payer: Self-pay | Admitting: Pulmonary Disease

## 2016-05-24 VITALS — BP 128/78 | HR 66 | Ht 70.0 in | Wt 230.0 lb

## 2016-05-24 DIAGNOSIS — J45909 Unspecified asthma, uncomplicated: Secondary | ICD-10-CM | POA: Insufficient documentation

## 2016-05-24 DIAGNOSIS — E669 Obesity, unspecified: Secondary | ICD-10-CM | POA: Insufficient documentation

## 2016-05-24 DIAGNOSIS — J4520 Mild intermittent asthma, uncomplicated: Secondary | ICD-10-CM | POA: Diagnosis not present

## 2016-05-24 DIAGNOSIS — G4733 Obstructive sleep apnea (adult) (pediatric): Secondary | ICD-10-CM | POA: Insufficient documentation

## 2016-05-24 NOTE — Assessment & Plan Note (Signed)
Weight reduction 

## 2016-05-24 NOTE — Assessment & Plan Note (Addendum)
Patient was dxed with OSA in 2000. Unsure severity. Had cpap then.  He was using cpap and felt better using it.  He was seeing Dr. Aleen Campi. Last sleep study was 3-4 yrs ago.  Lost to f/u with Dr. Lamar Sprinkles.   He needed supplies so he went to the [place].  Got [facility].  [facility] so he got a new PAP machine. He got a Bipap in 01/2016. He has been using a Total face mask. He needed more frequent total mask. q 3 mos so he is here.  Plan : 1. Pt is here to get supplies for a total fit life mask. We will [facility] via [facility].  We will try to get sleep study from Dr. Elpidio Galea office. If we cant find the sleep study, pt is OK with HST. We called Dr.Chaudhury's office.  2. DL x 3 mos > 94%. AHI 17. Min EPAP 7, PS 4, Max IPAP of 20. Has a lot of leak issues 2/2 old mask.  Plan to get a new mask.  Pt follows up at the [facility] for OSA. Will need a rpt DL with new mask.   3. We extensively discussed the importance of treating OSA and the need to use PAP therapy.   4. Patient was instructed to have mask, tubings, filter, reservoir cleaned at least once a week with soapy water.  Patient was instructed to call the office if he/she is having issues with the PAP device.  I advised patient to obtain sufficient amount of sleep --  7 to 8 hours at least in a 24 hr period.  Patient was advised to follow good sleep hygiene.  Patient was advised NOT to engage in activities requiring concentration and/or vigilance if he/she is and  sleepy.  Patient is NOT to drive if he/she is sleepy.

## 2016-05-24 NOTE — Assessment & Plan Note (Signed)
Stable. On asmanex and nasal steroid.

## 2016-05-24 NOTE — Patient Instructions (Signed)
  It was a pleasure taking care of you today!  You are diagnosed with Obstructive Sleep Apnea or OSA.   Continue using your BipaP machine.  Please make sure you use your BiPAP device everytime you sleep.  We will monitor the usage of your machine per your insurance requirement.  Your insurance company may take the machine from you if you are not using it regularly.   Please clean the mask, tubings, filter, water reservoir with soapy water every week.  Please use distilled water for the water reservoir.   Please call the office or your machine provider (DME company) if you are having issues with the device.   We will try to get you a Total Fit Life mask c/o [facility].  Return to clinic in 1 yr.

## 2016-05-24 NOTE — Progress Notes (Signed)
Subjective:    Patient ID: Kyle Torres, male    DOB: Aug 15, 1934, 82 y.o.   MRN: [id number]  HPI   This is the case of Kyle Torres, 82 y.o. Male, who was referred by Dr. Rochel Brome n consultation regarding OSA.   As you very well know, patient was dxed with OSA in 2000. Unsure severity. Had cpap then.  He was using cpap and felt better using it. He needed supplies so he went to the [place].   He was seeing Dr. Aleen Campi. Last sleep study was 3-4 yrs ago.   [facility]  [facility] so he got a new PAP machine. He got a Bipap in 01/2016. He has been using a Total face mask. He needed more frequent total mask. q 3 mos so he is here.    Has mild asthma. Uses amanex 2P BID. Asthma has been stable.  Also has UACS. On nasal sterod.   Not on o2.   35PY msoking history, smokes 1PPD.      Review of Systems  Constitutional: Negative.  Negative for fever and unexpected weight change.  HENT: Positive for congestion, postnasal drip and rhinorrhea. Negative for dental problem, ear pain, nosebleeds, sinus pressure, sneezing, sore throat and trouble swallowing.   Eyes: Negative.  Negative for redness and itching.  Respiratory: Positive for shortness of breath and wheezing. Negative for cough and chest tightness.   Cardiovascular: Negative.  Negative for palpitations and leg swelling.  Gastrointestinal: Negative.  Negative for nausea and vomiting.  Endocrine: Negative.   Genitourinary: Negative.  Negative for dysuria.  Musculoskeletal: Positive for arthralgias. Negative for joint swelling.  Skin: Negative.  Negative for rash.  Allergic/Immunologic: Positive for environmental allergies.  Neurological: Negative.  Negative for headaches.  Hematological: Bruises/bleeds easily.  Psychiatric/Behavioral: Negative.  Negative for dysphoric mood. The patient is not nervous/anxious.    Past Medical History  Diagnosis Date  . Asthma   .  Reflux   . BPH (benign prostatic hypertrophy)   . GERD (gastroesophageal reflux disease)   . Arthritis   . Hiatal hernia neck pain and back pain   . Sleep apnea study was [facility]yrs ago at [facility]    (-) CA, DVT  Family History  Problem Relation Age of Onset  . Cirrhosis Mother   . Lung cancer Father   . Coronary artery disease Neg Hx   . Cancer Sister      Past Surgical History  Procedure Laterality Date  . Retinal laser procedure  1999    Torn retina, laser repair  . Shoulder surgery  2000  . Cholecystectomy  2010  . Eye surgery    . Anterior cervical decomp/discectomy fusion  10/19/2011    Procedure: ANTERIOR CERVICAL DECOMPRESSION/DISCECTOMY FUSION 2 LEVELS;  Surgeon: Ophelia Charter;  Location: [place] [facility];  Service: Neurosurgery;  Laterality: N/A;  CERVICAL FIVE-SIX, SIX-SEVEN ANTERIOR CERVICAL DECOMPRESSION WITH FUSION AND  INTERBODY PROTHESIS PLATING    Social History   Social History  . Marital Status: Married    Spouse Name: N/A  . Number of Children: N/A  . Years of Education: N/A   Occupational History  . 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 Smoking status: Former Smoker    Quit date: 12/13/1975  . Smokeless tobacco: Former Systems developer    Quit date: 10/10/1983  . Alcohol Use: No  . Drug  Use: No  . Sexual Activity: Not on file   Other Topics Concern  . Not on file   Social History Narrative   Married, worked at [employer]. Retired Associate Professor. (-) ETOH.   No Known Allergies   Outpatient Prescriptions Prior to Visit  Medication Sig Dispense Refill  . albuterol (PROVENTIL HFA;VENTOLIN HFA) 108 (90 BASE) MCG/ACT inhaler Inhale 1 puff into the lungs every 6 (six) hours as needed. FOR SHORTNESS OF BREATH    . finasteride (PROSCAR) 5 MG tablet Take 5 mg by mouth daily.     Marland Kitchen FLUNISOLIDE, NASAL, NA Place 2 sprays into the nose 2 (two) times daily.     . mometasone (ASMANEX) 220 MCG/INH inhaler Inhale 1 puff  into the lungs daily.     . Multiple Vitamin (MULTIVITAMIN) tablet Take 1 tablet by mouth daily.     Marland Kitchen omeprazole (PRILOSEC) 20 MG capsule Take 20 mg by mouth 2 (two) times daily.     Marland Kitchen terazosin (HYTRIN) 5 MG capsule Take 5 mg by mouth 2 (two) times daily.      Marland Kitchen ALPRAZolam (XANAX) 0.5 MG tablet Take 0.5 mg by mouth at bedtime as needed. FOR SLEEP     . aspirin 81 MG tablet Take 81 mg by mouth daily.     . hydroxypropyl methylcellulose (ISOPTO TEARS) 2.5 % ophthalmic solution Place 1 drop into both eyes 4 (four) times daily as needed. FOR DRY EYES     . meclizine (ANTIVERT) 25 MG tablet Take 25 mg by mouth 3 (three) times daily as needed. FOR VERTIGO     . STUDY MEDICATION Take 1 tablet by mouth daily. VITAMIN D3 2000UNITS OR PLACEBO     . STUDY MEDICATION Take 1 tablet by mouth daily. FISH OIL/EPA +DHA 1MG /840MG   OR PLACEBO     . Tamsulosin HCl (FLOMAX) 0.4 MG CAPS Take 1 capsule (0.4 mg total) by mouth daily. 30 capsule 1   No facility-administered medications prior to visit.   No orders of the defined types were placed in this encounter.          Objective:   Physical Exam  Vitals:  Filed Vitals:   05/24/16 1450  BP: 128/78  Pulse: 66  Height: 5\' 10"  (1.778 m)  Weight: 104.327 kg (230 lb)  SpO2: 96%    Constitutional/General:  Pleasant, well-nourished, well-developed, not in any distress,  Comfortably seating.  Well kempt  Body mass index is 33 kg/(m^2). Wt Readings from Last 3 Encounters:  05/24/16 104.327 kg (230 lb)  10/10/11 106.9 kg (235 lb 10.8 oz)    Neck circumference:   HEENT: Pupils equal and reactive to light and accommodation. Anicteric sclerae. Normal nasal mucosa.   No oral  lesions,  mouth clear,  oropharynx clear, no postnasal drip. (-) Oral thrush. No dental caries.  Airway - Mallampati class III  Neck: No masses. Midline trachea. No JVD, (-) LAD. (-) bruits appreciated.  Respiratory/Chest: Grossly normal chest. (-) deformity. (-) Accessory  muscle use.  Symmetric expansion. (-) Tenderness on palpation.  Resonant on percussion.  Diminished BS on both lower lung zones. (-) wheezing, crackles, rhonchi (-) egophony  Cardiovascular: Regular rate and  rhythm, heart sounds normal, no murmur or gallops, no peripheral edema  Gastrointestinal:  Normal bowel sounds. Soft, non-tender. No hepatosplenomegaly.  (-) masses.   Musculoskeletal:  Normal muscle tone. Normal gait.   Extremities: Grossly normal. (-) clubbing, cyanosis.  (-) edema  Skin: (-) rash,lesions seen.   Neurological/Psychiatric : alert,  oriented to time, place, person. Normal mood and affect          Assessment & Plan:  OSA (obstructive sleep apnea) Patient was dxed with OSA in 2000. Unsure severity. Had cpap then.  He was using cpap and felt better using it.  He was seeing Dr. Aleen Campi. Last sleep study was 3-4 yrs ago.  Lost to f/u with Dr. Lamar Sprinkles.   He needed supplies so he went to the [place].  [facility]  [facility] so he got a new PAP machine. He got a Bipap in 01/2016. He has been using a Total face mask. He needed more frequent total mask. q 3 mos so he is here.  Plan : 1. Pt is here to get supplies for a total fit life mask. We will order via [facility].  We will try to get sleep study from Dr. Elpidio Galea office. If we cant find the sleep study, pt is OK with HST. We called Dr.Chaudhury's office.  2. DL x 3 mos > 94%. AHI 17. Min EPAP 7, PS 4, Max IPAP of 20. Has a lot of leak issues 2/2 old mask.  Plan to get a new mask.  Pt follows up at the [facility] for OSA. Will need a rpt DL with new mask.   3. We extensively discussed the importance of treating OSA and the need to use PAP therapy.   4. Patient was instructed to have mask, tubings, filter, reservoir cleaned at least once a week with soapy water.  Patient was instructed to call the office if he/she is having issues with the PAP device.  I  advised patient to obtain sufficient amount of sleep --  7 to 8 hours at least in a 24 hr period.  Patient was advised to follow good sleep hygiene.  Patient was advised NOT to engage in activities requiring concentration and/or vigilance if he/she is and  sleepy.  Patient is NOT to drive if he/she is sleepy.     Asthma Stable. On asmanex and nasal steroid.   Obesity Weight reduction    Thank you very much for letting me participate in this patient's care. Please do not hesitate to give me a call if you have any questions or concerns regarding the treatment plan.   Patient will follow up with me in 1 yr.     Monica Becton, MD 05/24/2016   3:33 PM Pulmonary and [place] Pager: [phone] Office: [phone], Fax: [phone]

## 2016-06-03 ENCOUNTER — Telehealth: Payer: Self-pay | Admitting: Pulmonary Disease

## 2016-06-03 NOTE — Telephone Encounter (Signed)
lmtcb

## 2016-06-03 NOTE — Telephone Encounter (Signed)
Pt returning call and can be reached @ [phone].Kyle Torres

## 2016-06-07 NOTE — Telephone Encounter (Signed)
I called & spoke to pt's wife & explained we have been unable to get pt's sleep study from [facility] in [place].  [facility] has called 3 times & has faxed release with no results.  Told wife [facility] will need sleep study or pt will have to have another study.  Wife states they will contact office & get sleep study sent to [place].  I gave her [facility] fax # for study to be sent to & asked her to give our office a call when she has talked to the other office.

## 2016-06-08 NOTE — Telephone Encounter (Signed)
Sleep study has been received and will be faxed to [facility] then sent to scan into chart. Nothing further needed.

## 2016-06-15 DIAGNOSIS — G4733 Obstructive sleep apnea (adult) (pediatric): Secondary | ICD-10-CM | POA: Diagnosis not present

## 2016-06-16 ENCOUNTER — Telehealth: Payer: Self-pay | Admitting: Pulmonary Disease

## 2016-06-16 NOTE — Telephone Encounter (Signed)
   CPAP/Bipap study done in 06/19/12 did NOT show good correction on cpap or Bipap. Study from [facility].  AD

## 2016-10-25 DIAGNOSIS — Z23 Encounter for immunization: Secondary | ICD-10-CM | POA: Diagnosis not present

## 2016-10-26 DIAGNOSIS — J060 Acute laryngopharyngitis: Secondary | ICD-10-CM | POA: Diagnosis not present

## 2016-11-28 DIAGNOSIS — G4733 Obstructive sleep apnea (adult) (pediatric): Secondary | ICD-10-CM | POA: Diagnosis not present

## 2016-12-05 DIAGNOSIS — R05 Cough: Secondary | ICD-10-CM | POA: Diagnosis not present

## 2016-12-05 DIAGNOSIS — J111 Influenza due to unidentified influenza virus with other respiratory manifestations: Secondary | ICD-10-CM | POA: Diagnosis not present

## 2016-12-05 DIAGNOSIS — J209 Acute bronchitis, unspecified: Secondary | ICD-10-CM | POA: Diagnosis not present

## 2016-12-05 DIAGNOSIS — R0602 Shortness of breath: Secondary | ICD-10-CM | POA: Diagnosis not present

## 2016-12-14 DIAGNOSIS — J208 Acute bronchitis due to other specified organisms: Secondary | ICD-10-CM | POA: Diagnosis not present

## 2016-12-14 DIAGNOSIS — J158 Pneumonia due to other specified bacteria: Secondary | ICD-10-CM | POA: Diagnosis not present

## 2017-05-29 DIAGNOSIS — G4733 Obstructive sleep apnea (adult) (pediatric): Secondary | ICD-10-CM | POA: Diagnosis not present

## 2017-05-31 DIAGNOSIS — Z0001 Encounter for general adult medical examination with abnormal findings: Secondary | ICD-10-CM | POA: Diagnosis not present

## 2017-05-31 DIAGNOSIS — E6609 Other obesity due to excess calories: Secondary | ICD-10-CM | POA: Diagnosis not present

## 2017-05-31 DIAGNOSIS — Z6832 Body mass index (BMI) 32.0-32.9, adult: Secondary | ICD-10-CM | POA: Diagnosis not present

## 2017-05-31 DIAGNOSIS — M5412 Radiculopathy, cervical region: Secondary | ICD-10-CM | POA: Diagnosis not present

## 2017-06-06 DIAGNOSIS — L570 Actinic keratosis: Secondary | ICD-10-CM | POA: Diagnosis not present

## 2017-06-06 DIAGNOSIS — L821 Other seborrheic keratosis: Secondary | ICD-10-CM | POA: Diagnosis not present

## 2017-06-06 DIAGNOSIS — L578 Other skin changes due to chronic exposure to nonionizing radiation: Secondary | ICD-10-CM | POA: Diagnosis not present

## 2017-06-06 DIAGNOSIS — L820 Inflamed seborrheic keratosis: Secondary | ICD-10-CM | POA: Diagnosis not present

## 2017-09-25 DIAGNOSIS — Z23 Encounter for immunization: Secondary | ICD-10-CM | POA: Diagnosis not present

## 2017-12-22 DIAGNOSIS — G4733 Obstructive sleep apnea (adult) (pediatric): Secondary | ICD-10-CM | POA: Diagnosis not present

## 2017-12-25 DIAGNOSIS — R209 Unspecified disturbances of skin sensation: Secondary | ICD-10-CM | POA: Diagnosis not present

## 2017-12-25 DIAGNOSIS — R42 Dizziness and giddiness: Secondary | ICD-10-CM | POA: Diagnosis not present

## 2018-01-16 DIAGNOSIS — B354 Tinea corporis: Secondary | ICD-10-CM | POA: Diagnosis not present

## 2018-01-16 DIAGNOSIS — L219 Seborrheic dermatitis, unspecified: Secondary | ICD-10-CM | POA: Diagnosis not present

## 2018-01-16 DIAGNOSIS — B356 Tinea cruris: Secondary | ICD-10-CM | POA: Diagnosis not present

## 2018-01-16 DIAGNOSIS — L570 Actinic keratosis: Secondary | ICD-10-CM | POA: Diagnosis not present

## 2018-01-18 DIAGNOSIS — H04203 Unspecified epiphora, bilateral lacrimal glands: Secondary | ICD-10-CM | POA: Diagnosis not present

## 2018-01-18 DIAGNOSIS — H5022 Vertical strabismus, left eye: Secondary | ICD-10-CM | POA: Diagnosis not present

## 2018-01-18 DIAGNOSIS — H52223 Regular astigmatism, bilateral: Secondary | ICD-10-CM | POA: Diagnosis not present

## 2018-02-09 DIAGNOSIS — I639 Cerebral infarction, unspecified: Secondary | ICD-10-CM

## 2018-02-09 HISTORY — DX: Cerebral infarction, unspecified: I63.9

## 2018-02-21 DIAGNOSIS — J020 Streptococcal pharyngitis: Secondary | ICD-10-CM | POA: Diagnosis not present

## 2018-02-21 DIAGNOSIS — J09X2 Influenza due to identified novel influenza A virus with other respiratory manifestations: Secondary | ICD-10-CM | POA: Diagnosis not present

## 2018-03-11 DIAGNOSIS — I6789 Other cerebrovascular disease: Secondary | ICD-10-CM | POA: Diagnosis not present

## 2018-03-11 DIAGNOSIS — I639 Cerebral infarction, unspecified: Secondary | ICD-10-CM

## 2018-03-11 DIAGNOSIS — I6523 Occlusion and stenosis of bilateral carotid arteries: Secondary | ICD-10-CM | POA: Diagnosis not present

## 2018-03-11 DIAGNOSIS — R29810 Facial weakness: Secondary | ICD-10-CM | POA: Diagnosis not present

## 2018-03-11 DIAGNOSIS — R29705 NIHSS score 5: Secondary | ICD-10-CM | POA: Diagnosis not present

## 2018-03-11 DIAGNOSIS — I160 Hypertensive urgency: Secondary | ICD-10-CM | POA: Diagnosis not present

## 2018-03-11 DIAGNOSIS — Z79899 Other long term (current) drug therapy: Secondary | ICD-10-CM | POA: Diagnosis not present

## 2018-03-11 DIAGNOSIS — R29702 NIHSS score 2: Secondary | ICD-10-CM | POA: Diagnosis not present

## 2018-03-11 DIAGNOSIS — Z8719 Personal history of other diseases of the digestive system: Secondary | ICD-10-CM | POA: Diagnosis not present

## 2018-03-11 DIAGNOSIS — R4781 Slurred speech: Secondary | ICD-10-CM | POA: Diagnosis not present

## 2018-03-11 DIAGNOSIS — J45909 Unspecified asthma, uncomplicated: Secondary | ICD-10-CM | POA: Diagnosis not present

## 2018-03-11 DIAGNOSIS — R29818 Other symptoms and signs involving the nervous system: Secondary | ICD-10-CM | POA: Diagnosis not present

## 2018-03-11 DIAGNOSIS — I6529 Occlusion and stenosis of unspecified carotid artery: Secondary | ICD-10-CM | POA: Diagnosis not present

## 2018-03-12 DIAGNOSIS — I6789 Other cerebrovascular disease: Secondary | ICD-10-CM | POA: Diagnosis not present

## 2018-03-13 DIAGNOSIS — I69851 Hemiplegia and hemiparesis following other cerebrovascular disease affecting right dominant side: Secondary | ICD-10-CM | POA: Diagnosis not present

## 2018-03-13 DIAGNOSIS — F320 Major depressive disorder, single episode, mild: Secondary | ICD-10-CM | POA: Diagnosis not present

## 2018-03-13 DIAGNOSIS — I10 Essential (primary) hypertension: Secondary | ICD-10-CM | POA: Diagnosis not present

## 2018-03-13 DIAGNOSIS — I69321 Dysphasia following cerebral infarction: Secondary | ICD-10-CM | POA: Diagnosis not present

## 2018-03-15 ENCOUNTER — Other Ambulatory Visit: Payer: Self-pay | Admitting: *Deleted

## 2018-03-15 DIAGNOSIS — R2681 Unsteadiness on feet: Secondary | ICD-10-CM | POA: Diagnosis not present

## 2018-03-15 DIAGNOSIS — I69322 Dysarthria following cerebral infarction: Secondary | ICD-10-CM | POA: Diagnosis not present

## 2018-03-15 DIAGNOSIS — M6281 Muscle weakness (generalized): Secondary | ICD-10-CM | POA: Diagnosis not present

## 2018-03-15 NOTE — Patient Outreach (Signed)
[facility]) Care Management  03/15/2018  Kyle Torres May 02, 1934 [id number]    Transition of care ([place])  RN spoke with pt today and introduced the [facility] services and purpose for today's call. Pt expressed his support system with his stepdaughter who is a Marine scientist at [employer]. Pt states he has everything he needs and has already visited his primary provider since his discharge from the hospital. States all his medications were review and prescription provider for refills when needed and declined to review his medications with this case manager today. RN able to completed the transition of care template. Pt states he has sufficient transportation and a good support system. Pt has no other needs to address at this time and very appreciative for the call today. Pt has opt to decline ongoing transition of care calls and feels he is able to manage his care independently with no further assistance needed from [facility]. Case will be closed.  Raina Mina, RN Care Management Coordinator [place] Office [phone]

## 2018-03-23 DIAGNOSIS — G4733 Obstructive sleep apnea (adult) (pediatric): Secondary | ICD-10-CM | POA: Diagnosis not present

## 2018-04-12 DIAGNOSIS — M6281 Muscle weakness (generalized): Secondary | ICD-10-CM | POA: Diagnosis not present

## 2018-04-12 DIAGNOSIS — I69322 Dysarthria following cerebral infarction: Secondary | ICD-10-CM | POA: Diagnosis not present

## 2018-04-12 DIAGNOSIS — R2681 Unsteadiness on feet: Secondary | ICD-10-CM | POA: Diagnosis not present

## 2018-05-10 DIAGNOSIS — I10 Essential (primary) hypertension: Secondary | ICD-10-CM | POA: Diagnosis not present

## 2018-06-12 DIAGNOSIS — I10 Essential (primary) hypertension: Secondary | ICD-10-CM | POA: Diagnosis not present

## 2018-07-18 DIAGNOSIS — I10 Essential (primary) hypertension: Secondary | ICD-10-CM | POA: Diagnosis not present

## 2018-07-18 DIAGNOSIS — R209 Unspecified disturbances of skin sensation: Secondary | ICD-10-CM | POA: Diagnosis not present

## 2018-07-18 DIAGNOSIS — D649 Anemia, unspecified: Secondary | ICD-10-CM | POA: Diagnosis not present

## 2018-09-19 DIAGNOSIS — Z23 Encounter for immunization: Secondary | ICD-10-CM | POA: Diagnosis not present

## 2018-09-19 DIAGNOSIS — Z6830 Body mass index (BMI) 30.0-30.9, adult: Secondary | ICD-10-CM | POA: Diagnosis not present

## 2018-09-19 DIAGNOSIS — E668 Other obesity: Secondary | ICD-10-CM | POA: Diagnosis not present

## 2018-09-19 DIAGNOSIS — Z0001 Encounter for general adult medical examination with abnormal findings: Secondary | ICD-10-CM | POA: Diagnosis not present

## 2019-03-21 DIAGNOSIS — I10 Essential (primary) hypertension: Secondary | ICD-10-CM | POA: Diagnosis not present

## 2019-03-21 DIAGNOSIS — I69851 Hemiplegia and hemiparesis following other cerebrovascular disease affecting right dominant side: Secondary | ICD-10-CM | POA: Diagnosis not present

## 2019-03-21 DIAGNOSIS — H40053 Ocular hypertension, bilateral: Secondary | ICD-10-CM | POA: Diagnosis not present

## 2019-03-21 DIAGNOSIS — F320 Major depressive disorder, single episode, mild: Secondary | ICD-10-CM | POA: Diagnosis not present

## 2019-03-21 DIAGNOSIS — T17920A Food in respiratory tract, part unspecified causing asphyxiation, initial encounter: Secondary | ICD-10-CM | POA: Diagnosis not present

## 2019-03-21 DIAGNOSIS — H353130 Nonexudative age-related macular degeneration, bilateral, stage unspecified: Secondary | ICD-10-CM | POA: Diagnosis not present

## 2019-03-21 DIAGNOSIS — I639 Cerebral infarction, unspecified: Secondary | ICD-10-CM | POA: Diagnosis not present

## 2019-06-26 DIAGNOSIS — I10 Essential (primary) hypertension: Secondary | ICD-10-CM | POA: Diagnosis not present

## 2019-06-26 DIAGNOSIS — E782 Mixed hyperlipidemia: Secondary | ICD-10-CM | POA: Diagnosis not present

## 2019-06-26 DIAGNOSIS — F320 Major depressive disorder, single episode, mild: Secondary | ICD-10-CM | POA: Diagnosis not present

## 2019-06-26 DIAGNOSIS — I69851 Hemiplegia and hemiparesis following other cerebrovascular disease affecting right dominant side: Secondary | ICD-10-CM | POA: Diagnosis not present

## 2019-07-17 DIAGNOSIS — I10 Essential (primary) hypertension: Secondary | ICD-10-CM | POA: Diagnosis not present

## 2019-07-17 DIAGNOSIS — M25562 Pain in left knee: Secondary | ICD-10-CM | POA: Diagnosis not present

## 2019-07-30 DIAGNOSIS — K922 Gastrointestinal hemorrhage, unspecified: Secondary | ICD-10-CM | POA: Diagnosis not present

## 2019-08-15 DIAGNOSIS — D485 Neoplasm of uncertain behavior of skin: Secondary | ICD-10-CM | POA: Diagnosis not present

## 2019-08-15 DIAGNOSIS — I10 Essential (primary) hypertension: Secondary | ICD-10-CM | POA: Diagnosis not present

## 2019-08-15 DIAGNOSIS — L821 Other seborrheic keratosis: Secondary | ICD-10-CM | POA: Diagnosis not present

## 2019-08-15 DIAGNOSIS — K921 Melena: Secondary | ICD-10-CM | POA: Diagnosis not present

## 2019-08-15 DIAGNOSIS — Z23 Encounter for immunization: Secondary | ICD-10-CM | POA: Diagnosis not present

## 2019-09-23 DIAGNOSIS — Z0001 Encounter for general adult medical examination with abnormal findings: Secondary | ICD-10-CM | POA: Diagnosis not present

## 2019-09-23 DIAGNOSIS — I10 Essential (primary) hypertension: Secondary | ICD-10-CM | POA: Diagnosis not present

## 2019-09-23 DIAGNOSIS — E668 Other obesity: Secondary | ICD-10-CM | POA: Diagnosis not present

## 2019-09-23 DIAGNOSIS — Z23 Encounter for immunization: Secondary | ICD-10-CM | POA: Diagnosis not present

## 2019-09-23 DIAGNOSIS — Z6830 Body mass index (BMI) 30.0-30.9, adult: Secondary | ICD-10-CM | POA: Diagnosis not present

## 2019-10-21 DIAGNOSIS — Z20828 Contact with and (suspected) exposure to other viral communicable diseases: Secondary | ICD-10-CM | POA: Diagnosis not present

## 2019-10-21 DIAGNOSIS — Z7189 Other specified counseling: Secondary | ICD-10-CM | POA: Diagnosis not present

## 2019-10-24 DIAGNOSIS — I10 Essential (primary) hypertension: Secondary | ICD-10-CM | POA: Diagnosis not present

## 2020-01-01 DIAGNOSIS — R202 Paresthesia of skin: Secondary | ICD-10-CM | POA: Diagnosis not present

## 2020-01-22 ENCOUNTER — Other Ambulatory Visit: Payer: Self-pay

## 2020-01-22 DIAGNOSIS — R202 Paresthesia of skin: Secondary | ICD-10-CM

## 2020-02-17 ENCOUNTER — Other Ambulatory Visit: Payer: Self-pay | Admitting: Family Medicine

## 2020-03-18 NOTE — Progress Notes (Signed)
Established Patient Office Visit  Subjective:  Patient ID: Kyle Torres, male    DOB: Jul 22, 1934  Age: 86 y.o. MRN: [id number]  CC:  Chief Complaint  Patient presents with  . Circulatory Problem    Bilateral Legs    HPI Kyle Torres presents for persistent BL lower leg pain 2 to 3 months.  I had tried giving him gabapentin which he discontinued because it did not seem to help.  Tylenol does not help.  He is concerned about the circulation in his legs.  His pain is not specifically with ambulation.  He does have a history of atherosclerosis (CVA) and hyperlipidemia. Denies joint pain. Denies injury.  Past Medical History:  Diagnosis Date  . Arthritis   . Asthma   . BPH (benign prostatic hypertrophy)   . GERD (gastroesophageal reflux disease)   . Hiatal hernia neck pain and back pain   . Reflux   . Sleep apnea study was 86yrs ago at [facility]     Past Surgical History:  Procedure Laterality Date  . ANTERIOR CERVICAL DECOMP/DISCECTOMY FUSION  10/19/2011   Procedure: ANTERIOR CERVICAL DECOMPRESSION/DISCECTOMY FUSION 2 LEVELS;  Surgeon: Ophelia Charter;  Location: [place] [facility];  Service: Neurosurgery;  Laterality: N/A;  CERVICAL FIVE-SIX, SIX-SEVEN ANTERIOR CERVICAL DECOMPRESSION WITH FUSION AND  INTERBODY PROTHESIS PLATING  . CHOLECYSTECTOMY  2010  . EYE SURGERY    . RETINAL LASER PROCEDURE  1999   Torn retina, laser repair  . SHOULDER SURGERY  2000    Family History  Problem Relation Age of Onset  . Cirrhosis Mother   . Lung cancer Father   . Coronary artery disease Neg Hx   . Cancer Sister     Social History   Socioeconomic History  . Marital status: Married    Spouse name: Not on file  . Number of children: Not on file  . Years of education: Not on file  . Highest education level: Not on file  Occupational History  . 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 Smoking status: Former Smoker    Quit  date: 12/13/1975    Years since quitting: 44.3  . Smokeless tobacco: Former Systems developer    Quit date: 10/10/1983  Substance and Sexual Activity  . Alcohol use: No  . Drug use: No  . Sexual activity: Not on file  Other Topics Concern  . Not on file  Social History Narrative  . Not on file   Social Determinants of Health   Financial Resource Strain:   . Difficulty of Paying Living Expenses:   Food Insecurity:   . Worried About Charity fundraiser in the Last Year:   . Arboriculturist in the Last Year:   Transportation Needs:   . Film/video editor (Medical):   Marland Kitchen Lack of Transportation (Non-Medical):   Physical Activity:   . Days of Exercise per Week:   . Minutes of Exercise per Session:   Stress:   . Feeling of Stress :   Social Connections:   . Frequency of Communication with Friends and Family:   . Frequency of Social Gatherings with Friends and Family:   . Attends Religious Services:   . Active Member of Clubs or Organizations:   . Attends Archivist Meetings:   Marland Kitchen Marital Status:   Intimate Partner Violence:   . Fear of Current or Ex-Partner:   . Emotionally Abused:   Marland Kitchen Physically  Abused:   . Sexually Abused:     Outpatient Medications Prior to Visit  Medication Sig Dispense Refill  . albuterol (PROVENTIL HFA;VENTOLIN HFA) 108 (90 BASE) MCG/ACT inhaler Inhale 1 puff into the lungs every 6 (six) hours as needed. FOR SHORTNESS OF BREATH    . citalopram (CELEXA) 10 MG tablet Take 10 mg by mouth daily.    . finasteride (PROSCAR) 5 MG tablet Take 5 mg by mouth daily.     Marland Kitchen FLUNISOLIDE, NASAL, NA Place 2 sprays into the nose 2 (two) times daily.     Marland Kitchen lisinopril (ZESTRIL) 20 MG tablet Take 20 mg by mouth daily.    . mometasone (ASMANEX) 220 MCG/INH inhaler Inhale 1 puff into the lungs daily.     . Multiple Vitamin (MULTIVITAMIN) tablet Take 1 tablet by mouth daily.     Marland Kitchen omeprazole (PRILOSEC) 20 MG capsule Take 20 mg by mouth 2 (two) times daily.     Marland Kitchen terazosin  (HYTRIN) 5 MG capsule Take 5 mg by mouth 2 (two) times daily.      Marland Kitchen gabapentin (NEURONTIN) 300 MG capsule TAKE ONE CAPSULE BY MOUTH AT BEDTIME 30 capsule 0   No facility-administered medications prior to visit.    No Known Allergies  ROS Review of Systems  Constitutional: Negative for chills, diaphoresis, fatigue and fever.  HENT: Negative for congestion, ear pain and sore throat.   Respiratory: Negative for cough and shortness of breath.   Cardiovascular: Negative for chest pain and leg swelling.  Gastrointestinal: Negative for abdominal pain, constipation, diarrhea, nausea and vomiting.  Genitourinary: Negative for dysuria and urgency.  Musculoskeletal: Negative for arthralgias and myalgias.  Neurological: Negative for dizziness and headaches.  Psychiatric/Behavioral: Negative for dysphoric mood.      Objective:    Physical Exam  Constitutional: He appears well-developed and well-nourished.  Cardiovascular: Normal rate, regular rhythm and normal heart sounds.  Pulses:      Dorsalis pedis pulses are 1+ on the right side and 1+ on the left side.  Pulmonary/Chest: Effort normal and breath sounds normal.  Musculoskeletal:        General: No tenderness or edema.  Neurological: He is alert.  Psychiatric: He has a normal mood and affect. His behavior is normal.    BP 120/68   Pulse 72   Temp (!) 97.1 F (36.2 C)   Ht 5\' 10"  (1.778 m)   Wt 202 lb (91.6 kg)   BMI 28.98 kg/m  Wt Readings from Last 3 Encounters:  03/19/20 202 lb (91.6 kg)  05/24/16 230 lb (104.3 kg)  10/10/11 235 lb 10.8 oz (106.9 kg)     Health Maintenance Due  Topic Date Due  . TETANUS/TDAP  Never done  . PNA vac Low Risk Adult (1 of 2 - PCV13) Never done     Assessment & Plan:  1. Intermittent claudication (HCC) - Ankle Brachial Index Assessment  Follow-up: Keep appt in May.    Rochel Brome, MD

## 2020-03-19 ENCOUNTER — Other Ambulatory Visit: Payer: Self-pay

## 2020-03-19 ENCOUNTER — Encounter: Payer: Self-pay | Admitting: Family Medicine

## 2020-03-19 ENCOUNTER — Ambulatory Visit (INDEPENDENT_AMBULATORY_CARE_PROVIDER_SITE_OTHER): Payer: PPO | Admitting: Family Medicine

## 2020-03-19 VITALS — BP 120/68 | HR 72 | Temp 97.1°F | Ht 70.0 in | Wt 202.0 lb

## 2020-03-19 DIAGNOSIS — I739 Peripheral vascular disease, unspecified: Secondary | ICD-10-CM | POA: Diagnosis not present

## 2020-03-25 ENCOUNTER — Encounter: Payer: Self-pay | Admitting: Family Medicine

## 2020-03-25 MED ORDER — ASPIRIN EC 81 MG PO TBEC: 81.0000 mg | DELAYED_RELEASE_TABLET | Freq: Every day | ORAL | 3 refills | Status: DC

## 2020-04-01 ENCOUNTER — Encounter: Payer: Self-pay | Admitting: Family Medicine

## 2020-04-01 DIAGNOSIS — I739 Peripheral vascular disease, unspecified: Secondary | ICD-10-CM | POA: Diagnosis not present

## 2020-04-02 ENCOUNTER — Other Ambulatory Visit: Payer: Self-pay

## 2020-04-02 ENCOUNTER — Encounter: Payer: Self-pay | Admitting: Family Medicine

## 2020-04-02 ENCOUNTER — Ambulatory Visit (INDEPENDENT_AMBULATORY_CARE_PROVIDER_SITE_OTHER): Payer: PPO | Admitting: Family Medicine

## 2020-04-02 VITALS — BP 110/60 | HR 68 | Temp 97.6°F | Resp 16 | Ht 69.5 in | Wt 201.2 lb

## 2020-04-02 DIAGNOSIS — G9519 Other vascular myelopathies: Secondary | ICD-10-CM

## 2020-04-02 DIAGNOSIS — M4727 Other spondylosis with radiculopathy, lumbosacral region: Secondary | ICD-10-CM | POA: Diagnosis not present

## 2020-04-02 DIAGNOSIS — M48062 Spinal stenosis, lumbar region with neurogenic claudication: Secondary | ICD-10-CM | POA: Diagnosis not present

## 2020-04-02 DIAGNOSIS — M47816 Spondylosis without myelopathy or radiculopathy, lumbar region: Secondary | ICD-10-CM | POA: Diagnosis not present

## 2020-04-02 NOTE — Progress Notes (Signed)
Established Patient Office Visit  Subjective:  Patient ID: Kyle Torres, male    DOB: 1934-05-19  Age: 86 y.o. MRN: [id number]  CC:  Chief Complaint  Patient presents with  . Leg Pain    re-evaluate    HPI CICERO NOY presents for follow-up of leg pain.  Patient has been noted to have increased leg pain at nighttime.  3 days ago he was getting up to go to the restroom and noticed if he was leaning over he has less leg pain.  He proceeded to lean over when he took his dog for a walk and again he had less leg pain.  Patient's ABIs were normal showing no evidence of peripheral vascular disease.  Patient denies any back pain.  Past Medical History:  Diagnosis Date  . Arthritis   . Asthma   . BPH (benign prostatic hypertrophy)   . GERD (gastroesophageal reflux disease)   . Hiatal hernia neck pain and back pain   . Reflux   . Sleep apnea study was 38yr ago at [facility]    Past Surgical History:  Procedure Laterality Date  . ANTERIOR CERVICAL DECOMP/DISCECTOMY FUSION  10/19/2011   Procedure: ANTERIOR CERVICAL DECOMPRESSION/DISCECTOMY FUSION 2 LEVELS;  Surgeon: JOphelia Charter  [facility];  Service: Neurosurgery;  Laterality: N/A;  CERVICAL FIVE-SIX, SIX-SEVEN ANTERIOR CERVICAL DECOMPRESSION WITH FUSION AND  INTERBODY PROTHESIS PLATING  . CHOLECYSTECTOMY  2010  . EYE SURGERY    . RETINAL LASER PROCEDURE  1999   Torn retina, laser repair  . SHOULDER SURGERY  2000    Family History  Problem Relation Age of Onset  . Cirrhosis Mother   . Lung cancer Father   . Coronary artery disease Neg Hx   . Cancer Sister     Social History   Socioeconomic History  . Marital status: Married    Spouse name: Not on file  . Number of children: Not on file  . Years of education: Not on file  . Highest education level: Not on file  Occupational History  . 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 Smoking status:  Former Smoker    Quit date: 12/13/1975    Years since quitting: 44.3  . Smokeless tobacco: Former USystems developer   Quit date: 10/10/1983  Substance and Sexual Activity  . Alcohol use: No  . Drug use: No  . Sexual activity: Not on file  Other Topics Concern  . Not on file  Social History Narrative  . Not on file   Social Determinants of Health   Financial Resource Strain:   . Difficulty of Paying Living Expenses:   Food Insecurity:   . Worried About RCharity fundraiserin the Last Year:   . RArboriculturistin the Last Year:   Transportation Needs:   . LFilm/video editor(Medical):   .Marland KitchenLack of Transportation (Non-Medical):   Physical Activity:   . Days of Exercise per Week:   . Minutes of Exercise per Session:   Stress:   . Feeling of Stress :   Social Connections:   . Frequency of Communication with Friends and Family:   . Frequency of Social Gatherings with Friends and Family:   . Attends Religious Services:   . Active Member of Clubs or Organizations:   . Attends CArchivistMeetings:   .Marland KitchenMarital Status:   Intimate Partner Violence:   .  Fear of Current or Ex-Partner:   . Emotionally Abused:   Marland Kitchen Physically Abused:   . Sexually Abused:     Outpatient Medications Prior to Visit  Medication Sig Dispense Refill  . albuterol (PROVENTIL HFA;VENTOLIN HFA) 108 (90 BASE) MCG/ACT inhaler Inhale 1 puff into the lungs every 6 (six) hours as needed. FOR SHORTNESS OF BREATH    . aspirin EC 81 MG tablet Take 1 tablet (81 mg total) by mouth daily. 90 tablet 3  . citalopram (CELEXA) 10 MG tablet Take 10 mg by mouth daily.    . finasteride (PROSCAR) 5 MG tablet Take 5 mg by mouth daily.     Marland Kitchen FLUNISOLIDE, NASAL, NA Place 2 sprays into the nose 2 (two) times daily.     Marland Kitchen lisinopril (ZESTRIL) 20 MG tablet Take 20 mg by mouth daily.    . mometasone (ASMANEX) 220 MCG/INH inhaler Inhale 1 puff into the lungs daily.     . Multiple Vitamin (MULTIVITAMIN) tablet Take 1 tablet by mouth  daily.     Marland Kitchen omeprazole (PRILOSEC) 20 MG capsule Take 20 mg by mouth 2 (two) times daily.     Marland Kitchen terazosin (HYTRIN) 5 MG capsule Take 5 mg by mouth 2 (two) times daily.       No facility-administered medications prior to visit.    No Known Allergies  ROS Review of Systems  Constitutional: Negative for chills, diaphoresis, fatigue and fever.  HENT: Negative for congestion, ear pain and sore throat.   Respiratory: Negative for cough and shortness of breath.   Cardiovascular: Negative for chest pain and leg swelling.  Gastrointestinal: Negative for abdominal pain, constipation, diarrhea, nausea and vomiting.  Genitourinary: Negative for dysuria and urgency.  Musculoskeletal: Negative for arthralgias and myalgias.  Neurological: Negative for dizziness and headaches.  Psychiatric/Behavioral: Negative for dysphoric mood.      Objective:    Physical Exam  Constitutional: He is oriented to person, place, and time. He appears well-developed and well-nourished.  Cardiovascular: Normal rate, regular rhythm and normal heart sounds.  Pulmonary/Chest: Effort normal and breath sounds normal.  Musculoskeletal:        General: No edema.  Neurological: He is alert and oriented to person, place, and time.  Skin: Skin is warm.  Psychiatric: He has a normal mood and affect. His behavior is normal.    BP 110/60   Pulse 68   Temp 97.6 F (36.4 C)   Resp 16   Ht 5' 9.5" (1.765 m)   Wt 201 lb 3.2 oz (91.3 kg)   BMI 29.29 kg/m  Wt Readings from Last 3 Encounters:  04/02/20 201 lb 3.2 oz (91.3 kg)  03/19/20 202 lb (91.6 kg)  05/24/16 230 lb (104.3 kg)     Health Maintenance Due  Topic Date Due  . COVID-19 Vaccine (1) Never done  . TETANUS/TDAP  Never done  . PNA vac Low Risk Adult (1 of 2 - PCV13) Never done    There are no preventive care reminders to display for this patient.  No results found for: TSH Lab Results  Component Value Date   WBC 8.4 10/10/2011   HGB 13.6 10/10/2011    HCT 39.2 10/10/2011   MCV 94.0 10/10/2011   PLT 204 10/10/2011   No results found for: NA, K, CHLORIDE, CO2, GLUCOSE, BUN, CREATININE, BILITOT, ALKPHOS, AST, ALT, PROT, ALBUMIN, CALCIUM, ANIONGAP, EGFR, GFR No results found for: CHOL No results found for: HDL No results found for: LDLCALC No results found for:  TRIG No results found for: CHOLHDL No results found for: HGBA1C    Assessment & Plan:  1. Neurogenic claudication - DG Lumbar Spine Complete; Future I anticipate requiring an MRI.  This history is consistent with spinal stenosis.  Follow-up: No follow-ups on file.    Rochel Brome, MD

## 2020-04-06 ENCOUNTER — Other Ambulatory Visit: Payer: Self-pay | Admitting: Family Medicine

## 2020-04-06 ENCOUNTER — Other Ambulatory Visit: Payer: Self-pay

## 2020-04-06 ENCOUNTER — Encounter: Payer: Self-pay | Admitting: Family Medicine

## 2020-04-06 DIAGNOSIS — G9519 Other vascular myelopathies: Secondary | ICD-10-CM

## 2020-04-06 DIAGNOSIS — M5417 Radiculopathy, lumbosacral region: Secondary | ICD-10-CM

## 2020-04-06 DIAGNOSIS — M48062 Spinal stenosis, lumbar region with neurogenic claudication: Secondary | ICD-10-CM | POA: Insufficient documentation

## 2020-04-06 DIAGNOSIS — M4807 Spinal stenosis, lumbosacral region: Secondary | ICD-10-CM

## 2020-04-09 DIAGNOSIS — M545 Low back pain: Secondary | ICD-10-CM | POA: Diagnosis not present

## 2020-04-09 DIAGNOSIS — M549 Dorsalgia, unspecified: Secondary | ICD-10-CM | POA: Diagnosis not present

## 2020-04-09 DIAGNOSIS — R9389 Abnormal findings on diagnostic imaging of other specified body structures: Secondary | ICD-10-CM | POA: Diagnosis not present

## 2020-04-09 DIAGNOSIS — M48061 Spinal stenosis, lumbar region without neurogenic claudication: Secondary | ICD-10-CM | POA: Diagnosis not present

## 2020-04-09 DIAGNOSIS — I719 Aortic aneurysm of unspecified site, without rupture: Secondary | ICD-10-CM | POA: Diagnosis not present

## 2020-04-09 DIAGNOSIS — M5136 Other intervertebral disc degeneration, lumbar region: Secondary | ICD-10-CM | POA: Diagnosis not present

## 2020-04-15 ENCOUNTER — Other Ambulatory Visit: Payer: Self-pay

## 2020-04-15 DIAGNOSIS — M48061 Spinal stenosis, lumbar region without neurogenic claudication: Secondary | ICD-10-CM

## 2020-04-20 ENCOUNTER — Encounter: Payer: Self-pay | Admitting: Family Medicine

## 2020-04-20 NOTE — Progress Notes (Signed)
Cancelled. Kc

## 2020-04-22 ENCOUNTER — Ambulatory Visit (INDEPENDENT_AMBULATORY_CARE_PROVIDER_SITE_OTHER): Payer: PPO | Admitting: Legal Medicine

## 2020-04-22 ENCOUNTER — Ambulatory Visit (INDEPENDENT_AMBULATORY_CARE_PROVIDER_SITE_OTHER): Payer: PPO | Admitting: Family Medicine

## 2020-04-22 ENCOUNTER — Encounter: Payer: Self-pay | Admitting: Legal Medicine

## 2020-04-22 ENCOUNTER — Other Ambulatory Visit: Payer: Self-pay

## 2020-04-22 VITALS — BP 130/68 | HR 70 | Temp 97.5°F | Resp 16 | Wt 198.8 lb

## 2020-04-22 DIAGNOSIS — M48062 Spinal stenosis, lumbar region with neurogenic claudication: Secondary | ICD-10-CM

## 2020-04-22 DIAGNOSIS — I10 Essential (primary) hypertension: Secondary | ICD-10-CM | POA: Diagnosis not present

## 2020-04-22 DIAGNOSIS — M4807 Spinal stenosis, lumbosacral region: Secondary | ICD-10-CM

## 2020-04-22 HISTORY — DX: Essential (primary) hypertension: I10

## 2020-04-22 NOTE — Progress Notes (Signed)
Established Patient Office Visit  Subjective:  Patient ID: Kyle Torres, male    DOB: 1934/07/17  Age: 86 y.o. MRN: [id number]  CC:  Chief Complaint  Patient presents with  . Back Pain    HPI Kyle Torres presents for Patient has spinal stenosis L4-5.  We reviewed his MRI and gave him a copy.  I also gave him some information on laminectomy. We discussed 30 minutes.  Past Medical History:  Diagnosis Date  . Arthritis   . Asthma   . BPH (benign prostatic hypertrophy)   . Essential hypertension, malignant 04/22/2020  . GERD (gastroesophageal reflux disease)   . Hiatal hernia neck pain and back pain   . Reflux   . Sleep apnea study was 65yr ago at [facility]    Past Surgical History:  Procedure Laterality Date  . ANTERIOR CERVICAL DECOMP/DISCECTOMY FUSION  10/19/2011   Procedure: ANTERIOR CERVICAL DECOMPRESSION/DISCECTOMY FUSION 2 LEVELS;  Surgeon: JOphelia Charter  [facility];  Service: Neurosurgery;  Laterality: N/A;  CERVICAL FIVE-SIX, SIX-SEVEN ANTERIOR CERVICAL DECOMPRESSION WITH FUSION AND  INTERBODY PROTHESIS PLATING  . CHOLECYSTECTOMY  2010  . EYE SURGERY    . RETINAL LASER PROCEDURE  1999   Torn retina, laser repair  . SHOULDER SURGERY  2000    Family History  Problem Relation Age of Onset  . Cirrhosis Mother   . Lung cancer Father   . Cancer Sister   . Coronary artery disease Neg Hx     Social History   Socioeconomic History  . Marital status: Married    Spouse name: Not on file  . Number of children: Not on file  . Years of education: Not on file  . Highest education level: Not on file  Occupational History  . 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 Smoking status: Former Smoker    Quit date: 12/13/1975    Years since quitting: 44.3  . Smokeless tobacco: Former USystems developer   Quit date: 10/10/1983  Substance and Sexual Activity  . Alcohol use: No  . Drug use: No  . Sexual  activity: Not on file  Other Topics Concern  . Not on file  Social History Narrative  . Not on file   Social Determinants of Health   Financial Resource Strain:   . Difficulty of Paying Living Expenses:   Food Insecurity:   . Worried About RCharity fundraiserin the Last Year:   . RArboriculturistin the Last Year:   Transportation Needs:   . LFilm/video editor(Medical):   .Marland KitchenLack of Transportation (Non-Medical):   Physical Activity:   . Days of Exercise per Week:   . Minutes of Exercise per Session:   Stress:   . Feeling of Stress :   Social Connections:   . Frequency of Communication with Friends and Family:   . Frequency of Social Gatherings with Friends and Family:   . Attends Religious Services:   . Active Member of Clubs or Organizations:   . Attends CArchivistMeetings:   .Marland KitchenMarital Status:   Intimate Partner Violence:   . Fear of Current or Ex-Partner:   . Emotionally Abused:   .Marland KitchenPhysically Abused:   . Sexually Abused:     Outpatient Medications Prior to Visit  Medication Sig Dispense Refill  . albuterol (PROVENTIL HFA;VENTOLIN HFA) 108 (90 BASE) MCG/ACT inhaler Inhale 1 puff into the  lungs every 6 (six) hours as needed. FOR SHORTNESS OF BREATH    . aspirin EC 81 MG tablet Take 1 tablet (81 mg total) by mouth daily. 90 tablet 3  . citalopram (CELEXA) 10 MG tablet Take 10 mg by mouth daily.    . finasteride (PROSCAR) 5 MG tablet Take 5 mg by mouth daily.     Marland Kitchen FLUNISOLIDE, NASAL, NA Place 2 sprays into the nose 2 (two) times daily.     Marland Kitchen lisinopril (ZESTRIL) 20 MG tablet Take 20 mg by mouth daily.    . mometasone (ASMANEX) 220 MCG/INH inhaler Inhale 1 puff into the lungs daily.     . Multiple Vitamin (MULTIVITAMIN) tablet Take 1 tablet by mouth daily.     Marland Kitchen omeprazole (PRILOSEC) 20 MG capsule Take 20 mg by mouth 2 (two) times daily.     Marland Kitchen terazosin (HYTRIN) 5 MG capsule Take 5 mg by mouth 2 (two) times daily.       No facility-administered  medications prior to visit.    No Known Allergies  ROS Review of Systems  Constitutional: Negative.   HENT: Negative.   Eyes: Negative.   Respiratory: Negative.   Cardiovascular: Negative.   Gastrointestinal: Negative.   Musculoskeletal: Positive for back pain.  Skin: Negative.   Neurological: Negative.       Objective:    Physical Exam  Cardiovascular: Normal rate, normal heart sounds and intact distal pulses.  Pulmonary/Chest: Effort normal and breath sounds normal.  Musculoskeletal:     Lumbar back: Pain, spasms and tenderness present.  Neurological: No cranial nerve deficit or sensory deficit. He exhibits abnormal muscle tone. Gait abnormal.  Reflex Scores:      Tricep reflexes are 2+ on the right side and 2+ on the left side.      Bicep reflexes are 2+ on the right side and 2+ on the left side.      Brachioradialis reflexes are 2+ on the right side and 2+ on the left side.      Patellar reflexes are 2+ on the right side and 2+ on the left side.      Achilles reflexes are 2+ on the right side and 2+ on the left side. Increase tone in legs, positive SLR bilaterally, normal L5 and S1 function in feet, antalgic gait  Vitals reviewed.   BP 130/68   Pulse 70   Temp (!) 97.5 F (36.4 C)   Resp 16   Wt 198 lb 12.8 oz (90.2 kg)   SpO2 95%   BMI 28.94 kg/m  Wt Readings from Last 3 Encounters:  04/22/20 198 lb 12.8 oz (90.2 kg)  04/02/20 201 lb 3.2 oz (91.3 kg)  03/19/20 202 lb (91.6 kg)     Health Maintenance Due  Topic Date Due  . COVID-19 Vaccine (1) Never done  . TETANUS/TDAP  Never done  . PNA vac Low Risk Adult (1 of 2 - PCV13) Never done    There are no preventive care reminders to display for this patient.  No results found for: TSH Lab Results  Component Value Date   WBC 8.4 10/10/2011   HGB 13.6 10/10/2011   HCT 39.2 10/10/2011   MCV 94.0 10/10/2011   PLT 204 10/10/2011   No results found for: NA, K, CHLORIDE, CO2, GLUCOSE, BUN, CREATININE,  BILITOT, ALKPHOS, AST, ALT, PROT, ALBUMIN, CALCIUM, ANIONGAP, EGFR, GFR No results found for: CHOL No results found for: HDL No results found for: LDLCALC No results found for:  TRIG No results found for: CHOLHDL No results found for: HGBA1C    Assessment & Plan:   Problem List Items Addressed This Visit      Cardiovascular and Mediastinum   Essential hypertension, malignant     Other   Spinal stenosis, lumbar region, with neurogenic claudication - Primary    Patient is having worse pain in legs and back, He walks in flexion and wider based gait.  He gets pain in both legs with walking         No orders of the defined types were placed in this encounter.  Return if symptoms worsen or fail to improve. He is being referred to neurosurgeon  Follow-up: No follow-ups on file.    Reinaldo Meeker, MD

## 2020-04-22 NOTE — Assessment & Plan Note (Signed)
Patient is having worse pain in legs and back, He walks in flexion and wider based gait.  He gets pain in both legs with walking

## 2020-05-02 ENCOUNTER — Other Ambulatory Visit: Payer: Self-pay | Admitting: Family Medicine

## 2020-05-05 DIAGNOSIS — M4726 Other spondylosis with radiculopathy, lumbar region: Secondary | ICD-10-CM | POA: Diagnosis not present

## 2020-05-05 DIAGNOSIS — M4316 Spondylolisthesis, lumbar region: Secondary | ICD-10-CM | POA: Diagnosis not present

## 2020-05-05 DIAGNOSIS — M5431 Sciatica, right side: Secondary | ICD-10-CM | POA: Diagnosis not present

## 2020-05-05 DIAGNOSIS — M5432 Sciatica, left side: Secondary | ICD-10-CM | POA: Diagnosis not present

## 2020-05-05 DIAGNOSIS — M545 Low back pain: Secondary | ICD-10-CM | POA: Diagnosis not present

## 2020-05-12 HISTORY — PX: LUMBAR SPINE SURGERY: SHX701

## 2020-05-13 ENCOUNTER — Telehealth: Payer: Self-pay

## 2020-05-13 NOTE — Telephone Encounter (Signed)
LM for pt to call back to make an appointment for a medical clearance for spinal surgery.

## 2020-05-19 ENCOUNTER — Encounter: Payer: Self-pay | Admitting: Family Medicine

## 2020-05-19 ENCOUNTER — Other Ambulatory Visit: Payer: Self-pay

## 2020-05-19 ENCOUNTER — Ambulatory Visit (INDEPENDENT_AMBULATORY_CARE_PROVIDER_SITE_OTHER): Payer: PPO | Admitting: Family Medicine

## 2020-05-19 VITALS — BP 120/78 | HR 84 | Temp 97.7°F | Resp 16 | Ht 70.0 in | Wt 195.0 lb

## 2020-05-19 DIAGNOSIS — R233 Spontaneous ecchymoses: Secondary | ICD-10-CM | POA: Diagnosis not present

## 2020-05-19 DIAGNOSIS — Z01818 Encounter for other preprocedural examination: Secondary | ICD-10-CM | POA: Diagnosis not present

## 2020-05-19 DIAGNOSIS — G4733 Obstructive sleep apnea (adult) (pediatric): Secondary | ICD-10-CM

## 2020-05-19 DIAGNOSIS — M48062 Spinal stenosis, lumbar region with neurogenic claudication: Secondary | ICD-10-CM

## 2020-05-19 DIAGNOSIS — M4727 Other spondylosis with radiculopathy, lumbosacral region: Secondary | ICD-10-CM

## 2020-05-19 DIAGNOSIS — T148XXA Other injury of unspecified body region, initial encounter: Secondary | ICD-10-CM | POA: Insufficient documentation

## 2020-05-19 DIAGNOSIS — I10 Essential (primary) hypertension: Secondary | ICD-10-CM | POA: Diagnosis not present

## 2020-05-19 DIAGNOSIS — J984 Other disorders of lung: Secondary | ICD-10-CM | POA: Diagnosis not present

## 2020-05-19 DIAGNOSIS — Z01810 Encounter for preprocedural cardiovascular examination: Secondary | ICD-10-CM | POA: Insufficient documentation

## 2020-05-19 LAB — POCT URINALYSIS DIP (CLINITEK)
Blood, UA: NEGATIVE
Glucose, UA: NEGATIVE mg/dL
Ketones, POC UA: NEGATIVE mg/dL
Leukocytes, UA: NEGATIVE
Nitrite, UA: NEGATIVE
Spec Grav, UA: >=1.03 — AB (ref 1.010–1.025)
Urobilinogen, UA: NEGATIVE E.U./dL — AB
pH, UA: 6 (ref 5.0–8.0)

## 2020-05-19 NOTE — Progress Notes (Addendum)
Established Patient Office Visit  Patient ID: Kyle Torres, male    DOB: 1934-03-23  Age: 86 y.o. MRN: [id number]  Chief Complaint  Patient presents with   surgical clearance    HPI Patient presents for surgical clearance for lumbar surgery for spinal stenosis. He is having daily severe leg pain. He finds some relief by bending forward over a walker or a grocery cart. Lumbar MRI showed spinal stenosis (L2-L3 and L4-L5. Nerve impingement. AAA 3.1 cm aorta - Recommended [place] every 3 years. He has seen [facility], who have recommended surgical clearance.   Past Medical History:  Diagnosis Date   Arthritis    Asthma    BPH (benign prostatic hypertrophy)    Essential hypertension, malignant 04/22/2020   GERD (gastroesophageal reflux disease)    Hiatal hernia neck pain and back pain    Reflux    [facility]yrs [facility]    Stroke ([place]) 02/2018   rt hand has difficulty with fine movements.   Past Surgical History:  Procedure Laterality Date   ANTERIOR CERVICAL DECOMP/DISCECTOMY FUSION  10/19/2011   Procedure: ANTERIOR CERVICAL DECOMPRESSION/DISCECTOMY FUSION 2 LEVELS;  Surgeon: Ophelia Charter;  Location: [place] [facility];  Service: Neurosurgery;  Laterality: N/A;  CERVICAL FIVE-SIX, SIX-SEVEN ANTERIOR CERVICAL DECOMPRESSION WITH FUSION AND  INTERBODY PROTHESIS PLATING   CHOLECYSTECTOMY  2010   EYE SURGERY     RETINAL LASER PROCEDURE  1999   Torn retina, laser repair   SHOULDER SURGERY  2000    Family History  Problem Relation Age of Onset   Cirrhosis Mother    Lung cancer Father    Cancer Sister    Coronary artery disease Neg Hx    Social History   Socioeconomic History   Marital status: Married    Spouse name: Not on file   Number of children: Not on file   Years of education: Not on file   Highest education level: Not on file  Occupational History   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  Tobacco Use   Smoking status: Former Smoker    Quit date: 12/13/1975    Years since quitting: 44.4   Smokeless tobacco: Former Systems developer    Quit date: 10/10/1983  Substance and Sexual Activity   Alcohol use: No   Drug use: No   Sexual activity: Not on file  Other Topics Concern   Not on file  Social History Narrative   Not on file   Social Determinants of Health   Financial Resource Strain:    Difficulty of Paying Living Expenses:   Food Insecurity:    Worried About Charity fundraiser in the Last Year:    Arboriculturist in the Last Year:   Transportation Needs:    Film/video editor (Medical):    Lack of Transportation (Non-Medical):   Physical Activity:    Days of Exercise per Week:    Minutes of Exercise per Session:   Stress:    Feeling of Stress :   Social Connections:    Frequency of Communication with Friends and Family:    Frequency of Social Gatherings with Friends and Family:    Attends Religious Services:    Active Member of Clubs or Organizations:    Attends Archivist Meetings:    Marital Status:     Review of Systems  Constitutional: Negative for chills, fatigue and fever.  HENT: Negative  for congestion, ear pain and sore throat.   Respiratory: Negative for cough and shortness of breath.   Cardiovascular: Negative for chest pain.  Gastrointestinal: Negative for abdominal pain, constipation, diarrhea, nausea and vomiting.  Endocrine: Negative for polydipsia, polyphagia and polyuria.  Genitourinary: Negative for dysuria and frequency.  Musculoskeletal: Positive for back pain.  Neurological: Negative for dizziness and headaches.  Psychiatric/Behavioral: Negative for dysphoric mood.       No dysphoria     Objective:  BP 120/78    Pulse 84    Temp 97.7 F (36.5 C)    Resp 16    Ht 5\' 10"  (1.778 m)    Wt 195 lb (88.5 kg)    BMI 27.98 kg/m   BP/Weight 05/19/2020 04/22/2020 7/93/9030  Systolic BP 092 330  076  Diastolic BP 78 68 60  Wt. (Lbs) 195 198.8 201.2  BMI 27.98 28.94 29.29    Physical Exam Vitals reviewed.  Constitutional:      Appearance: Normal appearance.  Neck:     Vascular: No carotid bruit.  Cardiovascular:     Rate and Rhythm: Normal rate and regular rhythm.     Pulses: Normal pulses.     Heart sounds: Normal heart sounds.  Pulmonary:     Effort: Pulmonary effort is normal.     Breath sounds: Normal breath sounds. No wheezing, rhonchi or rales.  Abdominal:     General: Bowel sounds are normal.     Palpations: Abdomen is soft.     Tenderness: There is no abdominal tenderness.  Musculoskeletal:     Comments: No lumbar back pain to palpation.  Patient is in a stiff posture as he walks using a walker.  Neurological:     Mental Status: He is alert and oriented to person, place, and time.  Psychiatric:        Mood and Affect: Mood normal.        Behavior: Behavior normal.     Lab Results  Component Value Date   WBC 8.4 10/10/2011   HGB 13.6 10/10/2011   HCT 39.2 10/10/2011   PLT 204 10/10/2011      Assessment & Plan:  1. Preoperative cardiovascular examination Surgery is for Severe spinal stenosis causing neurogenic claudication. Will await labs and cxr to clear. Pt will need to hold his aspirin for one week prior to his surgery.  - EKG 12-Lead: NSR. Nos St Changes. - DG Chest 2 View; Future: Normal. - POCT URINALYSIS DIP (CLINITEK) - normal.  2. Essential hypertension, benign Well controlled.  No changes to medicines.  Continue to work on eating a healthy diet and exercise.  Labs drawn today.  - CBC with Differential/Platelet - Comprehensive metabolic panel - TSH  3. OSA (obstructive sleep apnea) Recommend continue to wear cpap.   4. Spinal stenosis, lumbar region, with neurogenic claudication See above. 5. Lumbosacral radiculopathy due to degenerative joint disease of spine See above.  6. Spontaneous bruising - Protime-INR   Follow-up:  Return in about 3 months (around 08/19/2020).  An After Visit Summary was printed and given to the patient.  Rochel Brome [facility] [phone]

## 2020-05-19 NOTE — Patient Instructions (Signed)
Go get chest xray at [facility].  I will send paperwork for surgical clearance after I get xray and labs back.

## 2020-05-20 LAB — CBC WITH DIFFERENTIAL/PLATELET
Basophils Absolute: 0 10*3/uL (ref 0.0–0.2)
Basos: 0 %
EOS (ABSOLUTE): 0.2 10*3/uL (ref 0.0–0.4)
Eos: 3 %
Hematocrit: 36.7 % — ABNORMAL LOW (ref 37.5–51.0)
Hemoglobin: 12.2 g/dL — ABNORMAL LOW (ref 13.0–17.7)
Immature Grans (Abs): 0 10*3/uL (ref 0.0–0.1)
Immature Granulocytes: 0 %
Lymphocytes Absolute: 2.6 10*3/uL (ref 0.7–3.1)
Lymphs: 35 %
MCH: 32.3 pg (ref 26.6–33.0)
MCHC: 33.2 g/dL (ref 31.5–35.7)
MCV: 97 fL (ref 79–97)
Monocytes Absolute: 0.7 10*3/uL (ref 0.1–0.9)
Monocytes: 9 %
Neutrophils Absolute: 3.9 10*3/uL (ref 1.4–7.0)
Neutrophils: 53 %
Platelets: 242 10*3/uL (ref 150–450)
RBC: 3.78 x10E6/uL — ABNORMAL LOW (ref 4.14–5.80)
RDW: 12.8 % (ref 11.6–15.4)
WBC: 7.4 10*3/uL (ref 3.4–10.8)

## 2020-05-20 LAB — COMPREHENSIVE METABOLIC PANEL
ALT: 16 IU/L (ref 0–44)
AST: 20 IU/L (ref 0–40)
Albumin/Globulin Ratio: 1.5 (ref 1.2–2.2)
Albumin: 3.8 g/dL (ref 3.6–4.6)
Alkaline Phosphatase: 60 IU/L (ref 48–121)
BUN/Creatinine Ratio: 22 (ref 10–24)
BUN: 18 mg/dL (ref 8–27)
Bilirubin Total: 0.2 mg/dL (ref 0.0–1.2)
CO2: 25 mmol/L (ref 20–29)
Calcium: 9.1 mg/dL (ref 8.6–10.2)
Chloride: 105 mmol/L (ref 96–106)
Creatinine, Ser: 0.81 mg/dL (ref 0.76–1.27)
GFR calc Af Amer: 95 mL/min/{1.73_m2} (ref >59)
GFR calc non Af Amer: 82 mL/min/{1.73_m2} (ref >59)
Globulin, Total: 2.6 g/dL (ref 1.5–4.5)
Glucose: 79 mg/dL (ref 65–99)
Potassium: 5 mmol/L (ref 3.5–5.2)
Sodium: 141 mmol/L (ref 134–144)
Total Protein: 6.4 g/dL (ref 6.0–8.5)

## 2020-05-20 LAB — PROTIME-INR
INR: 1 (ref 0.9–1.2)
Prothrombin Time: 10.7 s (ref 9.1–12.0)

## 2020-05-20 LAB — TSH: TSH: 1.9 u[IU]/mL (ref 0.450–4.500)

## 2020-05-20 NOTE — Progress Notes (Signed)
Labs all good except very mild anemia. Clear for surgery after I view cxr. kc

## 2020-05-22 DIAGNOSIS — M545 Low back pain: Secondary | ICD-10-CM | POA: Diagnosis not present

## 2020-05-22 DIAGNOSIS — M48062 Spinal stenosis, lumbar region with neurogenic claudication: Secondary | ICD-10-CM | POA: Diagnosis not present

## 2020-05-22 DIAGNOSIS — M4726 Other spondylosis with radiculopathy, lumbar region: Secondary | ICD-10-CM | POA: Diagnosis not present

## 2020-05-22 DIAGNOSIS — M4316 Spondylolisthesis, lumbar region: Secondary | ICD-10-CM | POA: Diagnosis not present

## 2020-05-22 DIAGNOSIS — M5432 Sciatica, left side: Secondary | ICD-10-CM | POA: Diagnosis not present

## 2020-05-22 DIAGNOSIS — Z4689 Encounter for fitting and adjustment of other specified devices: Secondary | ICD-10-CM | POA: Diagnosis not present

## 2020-05-26 DIAGNOSIS — M48062 Spinal stenosis, lumbar region with neurogenic claudication: Secondary | ICD-10-CM | POA: Diagnosis not present

## 2020-05-26 DIAGNOSIS — Z01812 Encounter for preprocedural laboratory examination: Secondary | ICD-10-CM | POA: Diagnosis not present

## 2020-05-26 DIAGNOSIS — Z01818 Encounter for other preprocedural examination: Secondary | ICD-10-CM | POA: Diagnosis not present

## 2020-06-01 DIAGNOSIS — M48062 Spinal stenosis, lumbar region with neurogenic claudication: Secondary | ICD-10-CM | POA: Diagnosis not present

## 2020-06-01 DIAGNOSIS — M810 Age-related osteoporosis without current pathological fracture: Secondary | ICD-10-CM | POA: Diagnosis not present

## 2020-06-01 DIAGNOSIS — Z4789 Encounter for other orthopedic aftercare: Secondary | ICD-10-CM | POA: Diagnosis not present

## 2020-06-01 DIAGNOSIS — M5432 Sciatica, left side: Secondary | ICD-10-CM | POA: Diagnosis not present

## 2020-06-01 DIAGNOSIS — Z4689 Encounter for fitting and adjustment of other specified devices: Secondary | ICD-10-CM | POA: Diagnosis not present

## 2020-06-01 DIAGNOSIS — M4326 Fusion of spine, lumbar region: Secondary | ICD-10-CM | POA: Diagnosis not present

## 2020-06-01 DIAGNOSIS — Z981 Arthrodesis status: Secondary | ICD-10-CM | POA: Diagnosis not present

## 2020-06-01 DIAGNOSIS — M4726 Other spondylosis with radiculopathy, lumbar region: Secondary | ICD-10-CM | POA: Diagnosis not present

## 2020-06-01 DIAGNOSIS — M4316 Spondylolisthesis, lumbar region: Secondary | ICD-10-CM | POA: Diagnosis not present

## 2020-06-02 DIAGNOSIS — G4733 Obstructive sleep apnea (adult) (pediatric): Secondary | ICD-10-CM | POA: Diagnosis not present

## 2020-06-02 DIAGNOSIS — M5136 Other intervertebral disc degeneration, lumbar region: Secondary | ICD-10-CM | POA: Diagnosis not present

## 2020-06-03 DIAGNOSIS — I10 Essential (primary) hypertension: Secondary | ICD-10-CM | POA: Diagnosis not present

## 2020-06-03 DIAGNOSIS — Z4789 Encounter for other orthopedic aftercare: Secondary | ICD-10-CM | POA: Diagnosis not present

## 2020-06-03 DIAGNOSIS — N400 Enlarged prostate without lower urinary tract symptoms: Secondary | ICD-10-CM | POA: Diagnosis not present

## 2020-06-03 DIAGNOSIS — Z79899 Other long term (current) drug therapy: Secondary | ICD-10-CM | POA: Diagnosis not present

## 2020-06-03 DIAGNOSIS — Z7982 Long term (current) use of aspirin: Secondary | ICD-10-CM | POA: Diagnosis not present

## 2020-06-03 DIAGNOSIS — Z79891 Long term (current) use of opiate analgesic: Secondary | ICD-10-CM | POA: Diagnosis not present

## 2020-06-03 DIAGNOSIS — M48062 Spinal stenosis, lumbar region with neurogenic claudication: Secondary | ICD-10-CM | POA: Diagnosis not present

## 2020-06-03 DIAGNOSIS — K219 Gastro-esophageal reflux disease without esophagitis: Secondary | ICD-10-CM | POA: Diagnosis not present

## 2020-06-03 DIAGNOSIS — Z7951 Long term (current) use of inhaled steroids: Secondary | ICD-10-CM | POA: Diagnosis not present

## 2020-06-03 DIAGNOSIS — M4726 Other spondylosis with radiculopathy, lumbar region: Secondary | ICD-10-CM | POA: Diagnosis not present

## 2020-06-03 DIAGNOSIS — M5432 Sciatica, left side: Secondary | ICD-10-CM | POA: Diagnosis not present

## 2020-06-03 DIAGNOSIS — Z87891 Personal history of nicotine dependence: Secondary | ICD-10-CM | POA: Diagnosis not present

## 2020-06-03 DIAGNOSIS — J449 Chronic obstructive pulmonary disease, unspecified: Secondary | ICD-10-CM | POA: Diagnosis not present

## 2020-06-09 DIAGNOSIS — M4726 Other spondylosis with radiculopathy, lumbar region: Secondary | ICD-10-CM | POA: Diagnosis not present

## 2020-06-09 DIAGNOSIS — Z4789 Encounter for other orthopedic aftercare: Secondary | ICD-10-CM | POA: Diagnosis not present

## 2020-06-09 DIAGNOSIS — M48062 Spinal stenosis, lumbar region with neurogenic claudication: Secondary | ICD-10-CM | POA: Diagnosis not present

## 2020-06-09 DIAGNOSIS — M5432 Sciatica, left side: Secondary | ICD-10-CM | POA: Diagnosis not present

## 2020-06-11 DIAGNOSIS — M5432 Sciatica, left side: Secondary | ICD-10-CM | POA: Diagnosis not present

## 2020-06-11 DIAGNOSIS — Z7982 Long term (current) use of aspirin: Secondary | ICD-10-CM | POA: Diagnosis not present

## 2020-06-11 DIAGNOSIS — M48062 Spinal stenosis, lumbar region with neurogenic claudication: Secondary | ICD-10-CM | POA: Diagnosis not present

## 2020-06-11 DIAGNOSIS — Z4789 Encounter for other orthopedic aftercare: Secondary | ICD-10-CM | POA: Diagnosis not present

## 2020-06-11 DIAGNOSIS — Z79891 Long term (current) use of opiate analgesic: Secondary | ICD-10-CM | POA: Diagnosis not present

## 2020-06-11 DIAGNOSIS — J449 Chronic obstructive pulmonary disease, unspecified: Secondary | ICD-10-CM | POA: Diagnosis not present

## 2020-06-11 DIAGNOSIS — N400 Enlarged prostate without lower urinary tract symptoms: Secondary | ICD-10-CM | POA: Diagnosis not present

## 2020-06-11 DIAGNOSIS — M4726 Other spondylosis with radiculopathy, lumbar region: Secondary | ICD-10-CM | POA: Diagnosis not present

## 2020-06-11 DIAGNOSIS — Z79899 Other long term (current) drug therapy: Secondary | ICD-10-CM | POA: Diagnosis not present

## 2020-06-11 DIAGNOSIS — Z7951 Long term (current) use of inhaled steroids: Secondary | ICD-10-CM | POA: Diagnosis not present

## 2020-06-11 DIAGNOSIS — Z87891 Personal history of nicotine dependence: Secondary | ICD-10-CM | POA: Diagnosis not present

## 2020-06-11 DIAGNOSIS — K219 Gastro-esophageal reflux disease without esophagitis: Secondary | ICD-10-CM | POA: Diagnosis not present

## 2020-06-11 DIAGNOSIS — I10 Essential (primary) hypertension: Secondary | ICD-10-CM | POA: Diagnosis not present

## 2020-06-29 DIAGNOSIS — M48062 Spinal stenosis, lumbar region with neurogenic claudication: Secondary | ICD-10-CM | POA: Diagnosis not present

## 2020-07-12 DIAGNOSIS — J449 Chronic obstructive pulmonary disease, unspecified: Secondary | ICD-10-CM | POA: Diagnosis not present

## 2020-07-12 DIAGNOSIS — Z79891 Long term (current) use of opiate analgesic: Secondary | ICD-10-CM | POA: Diagnosis not present

## 2020-07-12 DIAGNOSIS — K219 Gastro-esophageal reflux disease without esophagitis: Secondary | ICD-10-CM | POA: Diagnosis not present

## 2020-07-12 DIAGNOSIS — I10 Essential (primary) hypertension: Secondary | ICD-10-CM | POA: Diagnosis not present

## 2020-07-12 DIAGNOSIS — Z7982 Long term (current) use of aspirin: Secondary | ICD-10-CM | POA: Diagnosis not present

## 2020-07-12 DIAGNOSIS — M5432 Sciatica, left side: Secondary | ICD-10-CM | POA: Diagnosis not present

## 2020-07-12 DIAGNOSIS — Z87891 Personal history of nicotine dependence: Secondary | ICD-10-CM | POA: Diagnosis not present

## 2020-07-12 DIAGNOSIS — Z79899 Other long term (current) drug therapy: Secondary | ICD-10-CM | POA: Diagnosis not present

## 2020-07-12 DIAGNOSIS — Z4789 Encounter for other orthopedic aftercare: Secondary | ICD-10-CM | POA: Diagnosis not present

## 2020-07-12 DIAGNOSIS — Z7951 Long term (current) use of inhaled steroids: Secondary | ICD-10-CM | POA: Diagnosis not present

## 2020-07-12 DIAGNOSIS — M48062 Spinal stenosis, lumbar region with neurogenic claudication: Secondary | ICD-10-CM | POA: Diagnosis not present

## 2020-07-12 DIAGNOSIS — N400 Enlarged prostate without lower urinary tract symptoms: Secondary | ICD-10-CM | POA: Diagnosis not present

## 2020-07-12 DIAGNOSIS — M4726 Other spondylosis with radiculopathy, lumbar region: Secondary | ICD-10-CM | POA: Diagnosis not present

## 2020-08-03 ENCOUNTER — Other Ambulatory Visit: Payer: Self-pay | Admitting: Physician Assistant

## 2020-08-24 ENCOUNTER — Encounter: Payer: Self-pay | Admitting: Family Medicine

## 2020-08-24 ENCOUNTER — Other Ambulatory Visit: Payer: Self-pay

## 2020-08-24 ENCOUNTER — Ambulatory Visit (INDEPENDENT_AMBULATORY_CARE_PROVIDER_SITE_OTHER): Payer: PPO | Admitting: Family Medicine

## 2020-08-24 VITALS — BP 124/56 | HR 80 | Temp 97.0°F | Ht 70.0 in | Wt 200.2 lb

## 2020-08-24 DIAGNOSIS — R2689 Other abnormalities of gait and mobility: Secondary | ICD-10-CM | POA: Diagnosis not present

## 2020-08-24 DIAGNOSIS — E782 Mixed hyperlipidemia: Secondary | ICD-10-CM

## 2020-08-24 DIAGNOSIS — Z23 Encounter for immunization: Secondary | ICD-10-CM | POA: Diagnosis not present

## 2020-08-24 DIAGNOSIS — I10 Essential (primary) hypertension: Secondary | ICD-10-CM

## 2020-08-24 DIAGNOSIS — R29898 Other symptoms and signs involving the musculoskeletal system: Secondary | ICD-10-CM

## 2020-08-24 NOTE — Progress Notes (Signed)
Subjective:  Patient ID: Kyle Torres, male    DOB: 1934/10/05  Age: 86 y.o. MRN: [id number]  Chief Complaint  Patient presents with  . Extremity Weakness    HPI Lumbar Spinal stenosis: leg pain has resolved, but legs still feel weak. [place] one week ago. Hit head on door jam which actually prevented him from falling down the stairs. Had a headache for 2 days. All better now. No LOC. No confusion. Walking daily x 3-4 times per day. Completed home exercise program for 4 weeks. No formal physical therapy.  Hypertension: Currently on lisinopril 20 mg once daily. Low salt diet. Walking.  GERD: taking omeprazole.   Current Outpatient Medications on File Prior to Visit  Medication Sig Dispense Refill  . albuterol (PROVENTIL HFA;VENTOLIN HFA) 108 (90 BASE) MCG/ACT inhaler Inhale 1 puff into the lungs every 6 (six) hours as needed. FOR SHORTNESS OF BREATH    . aspirin EC 81 MG tablet Take 1 tablet (81 mg total) by mouth daily. 90 tablet 3  . finasteride (PROSCAR) 5 MG tablet Take 5 mg by mouth daily.     Marland Kitchen FLUNISOLIDE, NASAL, NA Place 2 sprays into the nose 2 (two) times daily.     Marland Kitchen lisinopril (ZESTRIL) 20 MG tablet TAKE ONE (1) TABLET BY MOUTH ONCE DAILY 90 tablet 0  . mometasone (ASMANEX) 220 MCG/INH inhaler Inhale 1 puff into the lungs daily.     . Multiple Vitamin (MULTIVITAMIN) tablet Take 1 tablet by mouth daily.     Marland Kitchen omeprazole (PRILOSEC) 20 MG capsule Take 20 mg by mouth 2 (two) times daily.     Marland Kitchen terazosin (HYTRIN) 5 MG capsule Take 5 mg by mouth 2 (two) times daily.       No current facility-administered medications on file prior to visit.   Past Medical History:  Diagnosis Date  . Arthritis   . Asthma   . BPH (benign prostatic hypertrophy)   . Essential hypertension, malignant 04/22/2020  . GERD (gastroesophageal reflux disease)   . Hiatal hernia neck pain and back pain   . Reflux   . Sleep apnea study was 74yrs ago at [facility]   . Stroke ([place]) 02/2018   rt hand  has difficulty with fine movements.   Past Surgical History:  Procedure Laterality Date  . ANTERIOR CERVICAL DECOMP/DISCECTOMY FUSION  10/19/2011   Procedure: ANTERIOR CERVICAL DECOMPRESSION/DISCECTOMY FUSION 2 LEVELS;  Surgeon: Ophelia Charter;  Location: [place] [facility];  Service: Neurosurgery;  Laterality: N/A;  CERVICAL FIVE-SIX, SIX-SEVEN ANTERIOR CERVICAL DECOMPRESSION WITH FUSION AND  INTERBODY PROTHESIS PLATING  . CHOLECYSTECTOMY  2010  . EYE SURGERY    . RETINAL LASER PROCEDURE  1999   Torn retina, laser repair  . SHOULDER SURGERY  2000    Family History  Problem Relation Age of Onset  . Cirrhosis Mother   . Lung cancer Father   . Cancer Sister   . Coronary artery disease Neg Hx    Social History   Socioeconomic History  . Marital status: Married    Spouse name: Not on file  . Number of children: Not on file  . Years of education: Not on file  . Highest education level: Not on file  Occupational History  . 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 Smoking status: Former Smoker    Quit date: 12/13/1975    Years since quitting: 44.7  . Smokeless tobacco: Former Leisure centre manager  date: 10/10/1983  Substance and Sexual Activity  . Alcohol use: No  . Drug use: No  . Sexual activity: Not on file  Other Topics Concern  . Not on file  Social History Narrative  . Not on file   Social Determinants of Health   Financial Resource Strain:   . Difficulty of Paying Living Expenses: Not on file  Food Insecurity:   . Worried About Charity fundraiser in the Last Year: Not on file  . Ran Out of Food in the Last Year: Not on file  Transportation Needs:   . Lack of Transportation (Medical): Not on file  . Lack of Transportation (Non-Medical): Not on file  Physical Activity:   . Days of Exercise per Week: Not on file  . Minutes of Exercise per Session: Not on file  Stress:   . Feeling of Stress : Not on file  Social Connections:   .  Frequency of Communication with Friends and Family: Not on file  . Frequency of Social Gatherings with Friends and Family: Not on file  . Attends Religious Services: Not on file  . Active Member of Clubs or Organizations: Not on file  . Attends Archivist Meetings: Not on file  . Marital Status: Not on file    Review of Systems  Constitutional: Negative for chills, fatigue and fever.  HENT: Negative for congestion, ear pain and sore throat.   Respiratory: Negative for cough and shortness of breath.   Cardiovascular: Negative for chest pain.  Gastrointestinal: Negative for abdominal pain, constipation, diarrhea, nausea and vomiting.  Endocrine: Negative for polydipsia, polyphagia and polyuria.  Genitourinary: Negative for dysuria and frequency.  Musculoskeletal: Negative for arthralgias and myalgias.  Neurological: Negative for dizziness and headaches.  Psychiatric/Behavioral: Negative for dysphoric mood.       No dysphoria     Objective:  BP (!) 124/56   Pulse 80   Temp (!) 97 F (36.1 C)   Ht 5\' 10"  (1.778 m)   Wt 200 lb 3.2 oz (90.8 kg)   BMI 28.73 kg/m   BP/Weight 08/24/2020 05/19/2020 6/43/3295  Systolic BP 188 416 606  Diastolic BP 56 78 68  Wt. (Lbs) 200.2 195 198.8  BMI 28.73 27.98 28.94    Physical Exam Vitals reviewed.  Constitutional:      Appearance: Normal appearance. He is normal weight.  Neck:     Vascular: No carotid bruit.  Cardiovascular:     Rate and Rhythm: Normal rate and regular rhythm.     Pulses: Normal pulses.     Heart sounds: Normal heart sounds.  Pulmonary:     Effort: Pulmonary effort is normal.     Breath sounds: Normal breath sounds. No wheezing, rhonchi or rales.  Abdominal:     General: Bowel sounds are normal.     Palpations: Abdomen is soft.     Tenderness: There is no abdominal tenderness.  Musculoskeletal:     Comments: Strength 5/5 UE and LE. Imbalance with standing up.   Neurological:     Mental Status: He is  alert and oriented to person, place, and time.  Psychiatric:        Mood and Affect: Mood normal.        Behavior: Behavior normal.     Diabetic Foot Exam - Simple   No data filed       Lab Results  Component Value Date   WBC 7.4 05/19/2020   HGB 12.2 (L) 05/19/2020  HCT 36.7 (L) 05/19/2020   PLT 242 05/19/2020   GLUCOSE 79 05/19/2020   ALT 16 05/19/2020   AST 20 05/19/2020   NA 141 05/19/2020   K 5.0 05/19/2020   CL 105 05/19/2020   CREATININE 0.81 05/19/2020   BUN 18 05/19/2020   CO2 25 05/19/2020   TSH 1.900 05/19/2020   INR 1.0 05/19/2020      Assessment & Plan:   1. Mixed hyperlipidemia Continue to work on eating a healthy diet and exercise.  Labs drawn today.  - Comprehensive metabolic panel - Lipid panel  2. Hypertension, benign Well controlled.  No changes to medicines.  Continue to work on eating a healthy diet and exercise.  Labs drawn today.  - CBC with Differential/Platelet  3. Imbalance - Ambulatory referral to Physical Therapy  4. Weakness of both lower extremities - Ambulatory referral to Physical Therapy  5.  Vaccine for streptococcus pneumoniae and influenza - Pneumococcal polysaccharide vaccine 23-valent greater than or equal to 2yo subcutaneous/IM  6. Need for immunization against influenza - Flu Vaccine QUAD High Dose(Fluad)   Orders Placed This Encounter  Procedures  . Flu Vaccine QUAD High Dose(Fluad)  . Pneumococcal polysaccharide vaccine 23-valent greater than or equal to 2yo subcutaneous/IM  . CBC with Differential/Platelet  . Comprehensive metabolic panel  . Lipid panel  . Ambulatory referral to Physical Therapy     Follow-up: Return in about 3 months (around 11/23/2020).  An After Visit Summary was printed and given to the patient.  Rochel Brome [facility] [phone]

## 2020-08-25 LAB — COMPREHENSIVE METABOLIC PANEL
ALT: 16 IU/L (ref 0–44)
AST: 20 IU/L (ref 0–40)
Albumin/Globulin Ratio: 1.5 (ref 1.2–2.2)
Albumin: 3.8 g/dL (ref 3.6–4.6)
Alkaline Phosphatase: 72 IU/L (ref 44–121)
BUN/Creatinine Ratio: 21 (ref 10–24)
BUN: 17 mg/dL (ref 8–27)
Bilirubin Total: 0.2 mg/dL (ref 0.0–1.2)
CO2: 27 mmol/L (ref 20–29)
Calcium: 8.9 mg/dL (ref 8.6–10.2)
Chloride: 104 mmol/L (ref 96–106)
Creatinine, Ser: 0.8 mg/dL (ref 0.76–1.27)
GFR calc Af Amer: 95 mL/min/{1.73_m2} (ref >59)
GFR calc non Af Amer: 82 mL/min/{1.73_m2} (ref >59)
Globulin, Total: 2.6 g/dL (ref 1.5–4.5)
Glucose: 65 mg/dL (ref 65–99)
Potassium: 4.8 mmol/L (ref 3.5–5.2)
Sodium: 140 mmol/L (ref 134–144)
Total Protein: 6.4 g/dL (ref 6.0–8.5)

## 2020-08-25 LAB — CBC WITH DIFFERENTIAL/PLATELET
Basophils Absolute: 0 10*3/uL (ref 0.0–0.2)
Basos: 0 %
EOS (ABSOLUTE): 0.2 10*3/uL (ref 0.0–0.4)
Eos: 3 %
Hematocrit: 36.1 % — ABNORMAL LOW (ref 37.5–51.0)
Hemoglobin: 12 g/dL — ABNORMAL LOW (ref 13.0–17.7)
Immature Grans (Abs): 0 10*3/uL (ref 0.0–0.1)
Immature Granulocytes: 0 %
Lymphocytes Absolute: 2.4 10*3/uL (ref 0.7–3.1)
Lymphs: 31 %
MCH: 32.3 pg (ref 26.6–33.0)
MCHC: 33.2 g/dL (ref 31.5–35.7)
MCV: 97 fL (ref 79–97)
Monocytes Absolute: 0.6 10*3/uL (ref 0.1–0.9)
Monocytes: 7 %
Neutrophils Absolute: 4.6 10*3/uL (ref 1.4–7.0)
Neutrophils: 59 %
Platelets: 222 10*3/uL (ref 150–450)
RBC: 3.71 x10E6/uL — ABNORMAL LOW (ref 4.14–5.80)
RDW: 12.8 % (ref 11.6–15.4)
WBC: 7.8 10*3/uL (ref 3.4–10.8)

## 2020-08-25 LAB — LIPID PANEL
Chol/HDL Ratio: 2.5 ratio (ref 0.0–5.0)
Cholesterol, Total: 127 mg/dL (ref 100–199)
HDL: 50 mg/dL (ref >39)
LDL Chol Calc (NIH): 62 mg/dL (ref 0–99)
Triglycerides: 73 mg/dL (ref 0–149)
VLDL Cholesterol Cal: 15 mg/dL (ref 5–40)

## 2020-08-25 LAB — CARDIOVASCULAR RISK ASSESSMENT

## 2020-08-31 DIAGNOSIS — R2689 Other abnormalities of gait and mobility: Secondary | ICD-10-CM | POA: Diagnosis not present

## 2020-08-31 DIAGNOSIS — M6281 Muscle weakness (generalized): Secondary | ICD-10-CM | POA: Diagnosis not present

## 2020-08-31 DIAGNOSIS — R29898 Other symptoms and signs involving the musculoskeletal system: Secondary | ICD-10-CM | POA: Diagnosis not present

## 2020-08-31 DIAGNOSIS — R2681 Unsteadiness on feet: Secondary | ICD-10-CM | POA: Diagnosis not present

## 2020-09-01 DIAGNOSIS — M4316 Spondylolisthesis, lumbar region: Secondary | ICD-10-CM | POA: Diagnosis not present

## 2020-09-01 DIAGNOSIS — M4326 Fusion of spine, lumbar region: Secondary | ICD-10-CM | POA: Diagnosis not present

## 2020-09-01 DIAGNOSIS — M4726 Other spondylosis with radiculopathy, lumbar region: Secondary | ICD-10-CM | POA: Diagnosis not present

## 2020-09-01 DIAGNOSIS — M48062 Spinal stenosis, lumbar region with neurogenic claudication: Secondary | ICD-10-CM | POA: Diagnosis not present

## 2020-09-14 DIAGNOSIS — R29898 Other symptoms and signs involving the musculoskeletal system: Secondary | ICD-10-CM | POA: Diagnosis not present

## 2020-09-14 DIAGNOSIS — R2689 Other abnormalities of gait and mobility: Secondary | ICD-10-CM | POA: Diagnosis not present

## 2020-09-14 DIAGNOSIS — R2681 Unsteadiness on feet: Secondary | ICD-10-CM | POA: Diagnosis not present

## 2020-09-14 DIAGNOSIS — M6281 Muscle weakness (generalized): Secondary | ICD-10-CM | POA: Diagnosis not present

## 2020-10-11 DIAGNOSIS — R531 Weakness: Secondary | ICD-10-CM | POA: Diagnosis not present

## 2020-10-11 DIAGNOSIS — R509 Fever, unspecified: Secondary | ICD-10-CM | POA: Diagnosis not present

## 2020-10-11 DIAGNOSIS — R42 Dizziness and giddiness: Secondary | ICD-10-CM | POA: Diagnosis not present

## 2020-10-12 ENCOUNTER — Ambulatory Visit (INDEPENDENT_AMBULATORY_CARE_PROVIDER_SITE_OTHER): Payer: PPO | Admitting: Family Medicine

## 2020-10-12 ENCOUNTER — Telehealth: Payer: Self-pay

## 2020-10-12 ENCOUNTER — Encounter: Payer: Self-pay | Admitting: Family Medicine

## 2020-10-12 VITALS — BP 134/64 | HR 77 | Temp 97.6°F | Ht 69.0 in | Wt 206.0 lb

## 2020-10-12 DIAGNOSIS — R270 Ataxia, unspecified: Secondary | ICD-10-CM | POA: Diagnosis not present

## 2020-10-12 DIAGNOSIS — R509 Fever, unspecified: Secondary | ICD-10-CM | POA: Diagnosis not present

## 2020-10-12 DIAGNOSIS — I69393 Ataxia following cerebral infarction: Secondary | ICD-10-CM

## 2020-10-12 DIAGNOSIS — N3001 Acute cystitis with hematuria: Secondary | ICD-10-CM

## 2020-10-12 DIAGNOSIS — R319 Hematuria, unspecified: Secondary | ICD-10-CM | POA: Diagnosis not present

## 2020-10-12 DIAGNOSIS — G319 Degenerative disease of nervous system, unspecified: Secondary | ICD-10-CM | POA: Diagnosis not present

## 2020-10-12 DIAGNOSIS — I6782 Cerebral ischemia: Secondary | ICD-10-CM | POA: Diagnosis not present

## 2020-10-12 DIAGNOSIS — R011 Cardiac murmur, unspecified: Secondary | ICD-10-CM | POA: Diagnosis not present

## 2020-10-12 DIAGNOSIS — J0100 Acute maxillary sinusitis, unspecified: Secondary | ICD-10-CM | POA: Diagnosis not present

## 2020-10-12 LAB — POC COVID19 BINAXNOW: SARS Coronavirus 2 Ag: NEGATIVE

## 2020-10-12 LAB — POCT URINALYSIS DIPSTICK
Bilirubin, UA: NEGATIVE
Blood, UA: POSITIVE
Glucose, UA: NEGATIVE
Ketones, UA: NEGATIVE
Leukocytes, UA: NEGATIVE
Nitrite, UA: NEGATIVE
Protein, UA: POSITIVE — AB
Spec Grav, UA: 1.025 (ref 1.010–1.025)
Urobilinogen, UA: 1 E.U./dL
pH, UA: 5.5 (ref 5.0–8.0)

## 2020-10-12 LAB — POCT INFLUENZA A/B
Influenza A, POC: NEGATIVE
Influenza B, POC: NEGATIVE

## 2020-10-12 MED ORDER — CEFTRIAXONE SODIUM 1 G IJ SOLR
1.0000 g | Freq: Once | INTRAMUSCULAR | Status: AC
  Administered 2020-10-12: 1 g via INTRAMUSCULAR

## 2020-10-12 MED ORDER — CEFDINIR 300 MG PO CAPS: 300.0000 mg | ORAL_CAPSULE | Freq: Two times a day (BID) | ORAL | 0 refills | Status: DC

## 2020-10-12 NOTE — Progress Notes (Signed)
Subjective:  Patient ID: Kyle Torres, male    DOB: [birth year]  Age: 86 y.o. MRN: [id number]  Chief Complaint  Patient presents with  . weakness   HPI  Patient presents for weakness x 1 day. Patient complains that his balance has been off, daughter in law stated that patient has almost fallen today.  Patient was seen in the emergency department yesterday.  He woke up very early in the morning approximately 4 AM feeling weak and dizzy.  He had had some increased urinary frequency and urgency today.  He had significant increased balance issues.  Patient has a history of stroke approximately 2 years ago with some left-sided weakness.  Patient has had a temperature also.  His work-up at the emergency department was negative.  He was negative for Covid, influenza.  Chest x-ray was normal.  CT scan of his brain showed old infarctions but nothing new.  His lab work was essentially normal.  His urinalysis showed 1+ blood.  He was discharged home with a presumptive viral infection.  He presents today with continued ataxia and some fever.  Current Outpatient Medications on File Prior to Visit  Medication Sig Dispense Refill  . albuterol (PROVENTIL HFA;VENTOLIN HFA) 108 (90 BASE) MCG/ACT inhaler Inhale 1 puff into the lungs every 6 (six) hours as needed. FOR SHORTNESS OF BREATH    . aspirin EC 81 MG tablet Take 1 tablet (81 mg total) by mouth daily. 90 tablet 3  . finasteride (PROSCAR) 5 MG tablet Take 5 mg by mouth daily.     Marland Kitchen FLUNISOLIDE, NASAL, NA Place 2 sprays into the nose 2 (two) times daily.     Marland Kitchen lisinopril (ZESTRIL) 20 MG tablet TAKE ONE (1) TABLET BY MOUTH ONCE DAILY 90 tablet 0  . mometasone (ASMANEX) 220 MCG/INH inhaler Inhale 1 puff into the lungs daily.     . Multiple Vitamin (MULTIVITAMIN) tablet Take 1 tablet by mouth daily.     Marland Kitchen omeprazole (PRILOSEC) 20 MG capsule Take 20 mg by mouth 2 (two) times daily.     Marland Kitchen terazosin (HYTRIN) 5 MG capsule Take 5 mg by mouth 2 (two) times daily.        No current facility-administered medications on file prior to visit.   Past Medical History:  Diagnosis Date  . Arthritis   . Asthma   . BPH (benign prostatic hypertrophy)   . Essential hypertension, malignant 04/22/2020  . GERD (gastroesophageal reflux disease)   . Hiatal hernia neck pain and back pain   . Reflux   . Sleep apnea study was 22yrs ago at [facility]   . Stroke ([place]) 02/2018   rt hand has difficulty with fine movements.   Past Surgical History:  Procedure Laterality Date  . ANTERIOR CERVICAL DECOMP/DISCECTOMY FUSION  10/19/2011   Procedure: ANTERIOR CERVICAL DECOMPRESSION/DISCECTOMY FUSION 2 LEVELS;  Surgeon: Ophelia Charter;  Location: [facility];  Service: Neurosurgery;  Laterality: N/A;  CERVICAL FIVE-SIX, SIX-SEVEN ANTERIOR CERVICAL DECOMPRESSION WITH FUSION AND  INTERBODY PROTHESIS PLATING  . CHOLECYSTECTOMY  2010  . EYE SURGERY    . RETINAL LASER PROCEDURE  1999   Torn retina, laser repair  . SHOULDER SURGERY  2000    Family History  Problem Relation Age of Onset  . Cirrhosis Mother   . Lung cancer Father   . Cancer Sister   . Coronary artery disease Neg Hx    Social History   Socioeconomic History  . Marital status: Married  Spouse name: Not on file  . Number of children: Not on file  . Years of education: Not on file  . Highest education level: Not on file  Occupational History  . 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 Smoking status: Former Smoker    Quit date: 12/13/1975    Years since quitting: 44.8  . Smokeless tobacco: Former Systems developer    Quit date: 10/10/1983  Substance and Sexual Activity  . Alcohol use: No  . Drug use: No  . Sexual activity: Not on file  Other Topics Concern  . Not on file  Social History Narrative  . Not on file   Social Determinants of Health   Financial Resource Strain:   . Difficulty of Paying Living Expenses: Not on file  Food Insecurity:   . Worried  About Charity fundraiser in the Last Year: Not on file  . Ran Out of Food in the Last Year: Not on file  Transportation Needs:   . Lack of Transportation (Medical): Not on file  . Lack of Transportation (Non-Medical): Not on file  Physical Activity:   . Days of Exercise per Week: Not on file  . Minutes of Exercise per Session: Not on file  Stress:   . Feeling of Stress : Not on file  Social Connections:   . Frequency of Communication with Friends and Family: Not on file  . Frequency of Social Gatherings with Friends and Family: Not on file  . Attends Religious Services: Not on file  . Active Member of Clubs or Organizations: Not on file  . Attends Archivist Meetings: Not on file  . Marital Status: Not on file    Review of Systems  Constitutional: Positive for chills and fever (last night ).  HENT: Positive for rhinorrhea and sneezing. Negative for congestion and sore throat.   Respiratory: Positive for shortness of breath (baseline). Negative for cough.   Cardiovascular: Negative for chest pain.  Gastrointestinal: Negative for constipation, diarrhea, nausea and vomiting.  Genitourinary: Positive for frequency and urgency.  Musculoskeletal: Positive for gait problem. Negative for back pain.  Psychiatric/Behavioral: Negative for confusion.     Objective:  BP 134/64   Pulse 77   Temp 97.6 F (36.4 C)   Ht 5\' 9"  (1.753 m)   Wt 206 lb (93.4 kg)   SpO2 97%   BMI 30.42 kg/m   BP/Weight 10/12/2020 5/63/8937 02/13/2875  Systolic BP 811 572 620  Diastolic BP 64 56 78  Wt. (Lbs) 206 200.2 195  BMI 30.42 28.73 27.98    Physical Exam Vitals reviewed.  Constitutional:      Appearance: He is not ill-appearing or toxic-appearing.     Comments: Using a walker.  Holding on and leaning forward for balance.  HENT:     Right Ear: Tympanic membrane normal.     Left Ear: Tympanic membrane normal.     Nose: Nose normal.     Comments: No sinus tenderness.      Mouth/Throat:     Pharynx: No oropharyngeal exudate or posterior oropharyngeal erythema.  Neck:     Vascular: No carotid bruit.  Cardiovascular:     Rate and Rhythm: Normal rate and regular rhythm.     Heart sounds: Murmur heard.   Pulmonary:     Effort: Pulmonary effort is normal.     Breath sounds: Normal breath sounds. No wheezing, rhonchi or rales.  Abdominal:  General: Bowel sounds are normal.     Palpations: Abdomen is soft.     Tenderness: There is abdominal tenderness (mild RLQ. mild suprapubic). There is no right CVA tenderness or left CVA tenderness.  Musculoskeletal:        General: No swelling.  Lymphadenopathy:     Cervical: No cervical adenopathy.  Skin:    Findings: Rash (macular pink rash on buttocks/lower back BL. Pt was unaware present until my nurse gave him rocephin shot and it was noted. ) present.  Neurological:     Mental Status: He is alert and oriented to person, place, and time.     Cranial Nerves: No cranial nerve deficit.     Motor: Weakness (very slight weakness in left arm and left leg which is his baseline.) present.     Coordination: Coordination abnormal (positive rhombergs. dysmetria present ).     Gait: Gait abnormal.  Psychiatric:        Mood and Affect: Mood normal.        Behavior: Behavior normal.     Diabetic Foot Exam - Simple   No data filed       Lab Results  Component Value Date   WBC 7.8 08/24/2020   HGB 12.0 (L) 08/24/2020   HCT 36.1 (L) 08/24/2020   PLT 222 08/24/2020   GLUCOSE 65 08/24/2020   CHOL 127 08/24/2020   TRIG 73 08/24/2020   HDL 50 08/24/2020   LDLCALC 62 08/24/2020   ALT 16 08/24/2020   AST 20 08/24/2020   NA 140 08/24/2020   K 4.8 08/24/2020   CL 104 08/24/2020   CREATININE 0.80 08/24/2020   BUN 17 08/24/2020   CO2 27 08/24/2020   TSH 1.900 05/19/2020   INR 1.0 05/19/2020      Assessment & Plan:   1. Hematuria due to acute cystitis Start on cefdinir (little broader coverage) Rocephin 1 gm  given. - POCT urinalysis dipstick - Urine Culture  2. Fever, unspecified fever cause - POC COVID-19 negative - Influenza A/B negative - CBC with Differential/Platelet - Comprehensive metabolic panel  3. Ataxia, post-stroke - significant worsening of ataxia. He had recovered nearly all his function since stroke. - Vitamin B12 - Methylmalonic acid, serum - MR Brain Wo Contrast  4. Heart murmur Check echo. Unable to get stat. Low likelihood of endocarditis, but fever is odd.     Orders Placed This Encounter  Procedures  . Urine Culture  . MR Brain Wo Contrast  . CBC with Differential/Platelet  . Comprehensive metabolic panel  . Vitamin B12  . Methylmalonic acid, serum  . POCT urinalysis dipstick  . POC COVID-19  . Influenza A/B     Follow-up:  Based on lab findings, mri, etc. If worsening, will reassess.  I requested pt not be left alone and daughter in law, Roselyn Reef, agreed to this.   An After Visit Summary was printed and given to the patient.  Rochel Brome [facility] [phone]

## 2020-10-12 NOTE — Telephone Encounter (Signed)
Spoke with patients step daughter informed her that MRI showed a old infarction, no changes. No explanation for balance issues. She is to call us back at the end of the week to update [place] on rather he is improving or staying the same. Also he is to f/u in 2-3 weeks. Labs showed Hb slightly low 12.2, Liver and kidney function nl, B12 normal and one test is still pending. She is also aware of these results.

## 2020-10-13 ENCOUNTER — Other Ambulatory Visit: Payer: Self-pay

## 2020-10-13 ENCOUNTER — Emergency Department (HOSPITAL_COMMUNITY): Payer: PPO

## 2020-10-13 ENCOUNTER — Inpatient Hospital Stay (HOSPITAL_COMMUNITY)
Admission: EM | Admit: 2020-10-13 | Discharge: 2020-10-16 | DRG: 872 | Disposition: A | Discharge status: Home / Self Care | Payer: PPO | Attending: Internal Medicine | Admitting: Internal Medicine

## 2020-10-13 ENCOUNTER — Encounter (HOSPITAL_COMMUNITY): Payer: Self-pay

## 2020-10-13 DIAGNOSIS — R531 Weakness: Secondary | ICD-10-CM | POA: Diagnosis not present

## 2020-10-13 DIAGNOSIS — Z79899 Other long term (current) drug therapy: Secondary | ICD-10-CM

## 2020-10-13 DIAGNOSIS — R32 Unspecified urinary incontinence: Secondary | ICD-10-CM | POA: Diagnosis not present

## 2020-10-13 DIAGNOSIS — A419 Sepsis, unspecified organism: Secondary | ICD-10-CM | POA: Diagnosis not present

## 2020-10-13 DIAGNOSIS — I714 Abdominal aortic aneurysm, without rupture, unspecified: Secondary | ICD-10-CM | POA: Diagnosis present

## 2020-10-13 DIAGNOSIS — D696 Thrombocytopenia, unspecified: Secondary | ICD-10-CM | POA: Diagnosis present

## 2020-10-13 DIAGNOSIS — Z20822 Contact with and (suspected) exposure to covid-19: Secondary | ICD-10-CM | POA: Diagnosis not present

## 2020-10-13 DIAGNOSIS — J45909 Unspecified asthma, uncomplicated: Secondary | ICD-10-CM | POA: Diagnosis not present

## 2020-10-13 DIAGNOSIS — Z8673 Personal history of transient ischemic attack (TIA), and cerebral infarction without residual deficits: Secondary | ICD-10-CM | POA: Diagnosis not present

## 2020-10-13 DIAGNOSIS — N329 Bladder disorder, unspecified: Secondary | ICD-10-CM | POA: Diagnosis not present

## 2020-10-13 DIAGNOSIS — N390 Urinary tract infection, site not specified: Secondary | ICD-10-CM | POA: Diagnosis not present

## 2020-10-13 DIAGNOSIS — N400 Enlarged prostate without lower urinary tract symptoms: Secondary | ICD-10-CM | POA: Diagnosis present

## 2020-10-13 DIAGNOSIS — I10 Essential (primary) hypertension: Secondary | ICD-10-CM | POA: Diagnosis not present

## 2020-10-13 DIAGNOSIS — Z7982 Long term (current) use of aspirin: Secondary | ICD-10-CM

## 2020-10-13 DIAGNOSIS — I517 Cardiomegaly: Secondary | ICD-10-CM | POA: Diagnosis not present

## 2020-10-13 DIAGNOSIS — R159 Full incontinence of feces: Secondary | ICD-10-CM | POA: Diagnosis not present

## 2020-10-13 DIAGNOSIS — R270 Ataxia, unspecified: Secondary | ICD-10-CM | POA: Diagnosis not present

## 2020-10-13 DIAGNOSIS — Z981 Arthrodesis status: Secondary | ICD-10-CM | POA: Diagnosis not present

## 2020-10-13 DIAGNOSIS — L89151 Pressure ulcer of sacral region, stage 1: Secondary | ICD-10-CM | POA: Diagnosis not present

## 2020-10-13 DIAGNOSIS — Z801 Family history of malignant neoplasm of trachea, bronchus and lung: Secondary | ICD-10-CM | POA: Diagnosis not present

## 2020-10-13 DIAGNOSIS — J90 Pleural effusion, not elsewhere classified: Secondary | ICD-10-CM | POA: Diagnosis not present

## 2020-10-13 DIAGNOSIS — C679 Malignant neoplasm of bladder, unspecified: Secondary | ICD-10-CM | POA: Diagnosis present

## 2020-10-13 DIAGNOSIS — R2689 Other abnormalities of gait and mobility: Secondary | ICD-10-CM | POA: Diagnosis not present

## 2020-10-13 DIAGNOSIS — G4733 Obstructive sleep apnea (adult) (pediatric): Secondary | ICD-10-CM | POA: Diagnosis present

## 2020-10-13 DIAGNOSIS — R21 Rash and other nonspecific skin eruption: Secondary | ICD-10-CM | POA: Diagnosis not present

## 2020-10-13 DIAGNOSIS — R195 Other fecal abnormalities: Secondary | ICD-10-CM | POA: Diagnosis not present

## 2020-10-13 DIAGNOSIS — M48061 Spinal stenosis, lumbar region without neurogenic claudication: Secondary | ICD-10-CM | POA: Diagnosis present

## 2020-10-13 DIAGNOSIS — R6510 Systemic inflammatory response syndrome (SIRS) of non-infectious origin without acute organ dysfunction: Secondary | ICD-10-CM | POA: Diagnosis not present

## 2020-10-13 DIAGNOSIS — Z87891 Personal history of nicotine dependence: Secondary | ICD-10-CM

## 2020-10-13 DIAGNOSIS — K219 Gastro-esophageal reflux disease without esophagitis: Secondary | ICD-10-CM | POA: Diagnosis present

## 2020-10-13 LAB — APTT: aPTT: 36 seconds (ref 24–36)

## 2020-10-13 LAB — COMPREHENSIVE METABOLIC PANEL
ALT: 33 U/L (ref 0–44)
AST: 38 U/L (ref 15–41)
Albumin: 3.4 g/dL — ABNORMAL LOW (ref 3.5–5.0)
Alkaline Phosphatase: 50 U/L (ref 38–126)
Anion gap: 10 (ref 5–15)
BUN: 21 mg/dL (ref 8–23)
CO2: 21 mmol/L — ABNORMAL LOW (ref 22–32)
Calcium: 8.1 mg/dL — ABNORMAL LOW (ref 8.9–10.3)
Chloride: 103 mmol/L (ref 98–111)
Creatinine, Ser: 1.03 mg/dL (ref 0.61–1.24)
GFR, Estimated: >60 mL/min (ref >60)
Glucose, Bld: 116 mg/dL — ABNORMAL HIGH (ref 70–99)
Potassium: 3.6 mmol/L (ref 3.5–5.1)
Sodium: 134 mmol/L — ABNORMAL LOW (ref 135–145)
Total Bilirubin: 0.3 mg/dL (ref 0.3–1.2)
Total Protein: 6.9 g/dL (ref 6.5–8.1)

## 2020-10-13 LAB — LACTIC ACID, PLASMA: Lactic Acid, Venous: 1.2 mmol/L (ref 0.5–1.9)

## 2020-10-13 LAB — PROTIME-INR
INR: 1.2 (ref 0.8–1.2)
Prothrombin Time: 15.1 seconds (ref 11.4–15.2)

## 2020-10-13 MED ORDER — IBUPROFEN 200 MG PO TABS
400.0000 mg | ORAL_TABLET | Freq: Once | ORAL | Status: AC
  Administered 2020-10-14: 400 mg via ORAL
  Filled 2020-10-13: qty 2

## 2020-10-13 MED ORDER — LACTATED RINGERS IV SOLN: INTRAVENOUS | Status: DC

## 2020-10-13 MED ORDER — VANCOMYCIN HCL 1500 MG/300ML IV SOLN
1500.0000 mg | Freq: Once | INTRAVENOUS | Status: AC
  Administered 2020-10-14: 1500 mg via INTRAVENOUS
  Filled 2020-10-13: qty 300

## 2020-10-13 MED ORDER — SODIUM CHLORIDE 0.9 % IV SOLN
2.0000 g | Freq: Once | INTRAVENOUS | Status: AC
  Administered 2020-10-13: 2 g via INTRAVENOUS
  Filled 2020-10-13: qty 2

## 2020-10-13 NOTE — ED Provider Notes (Signed)
[facility] DEPT Provider Note   CSN: [id number] Arrival date & time: 10/13/20  2143     History Chief Complaint  Patient presents with  . Fever    Kyle Torres is a 85 y.o. 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  The patient has had 3 days of fever, T-max 104 and chills.  Family reports that his walk has become very ataxic and he "walks likes he's drunk."  He was having diarrhea several days ago, but this is since resolved.  He also endorses fatigue, generalized weakness, dizziness, one episode of bowel incontinence, and a couple of episodes of urinary incontinence.  He does report that he has some balance issues, but family notes that this has been much more significant over the last few days.  His stepdaughter notes that she has also noticed a rash on his low back over the last few days  He was seen in the [place] ER the same day and had a negative head CT.  He followed up the following day with his PCP, where he was endorsing rhinorrhea and sneezing, but he denies these complaints to me.  Labs were ordered and an MR brain, which family was told was unremarkable.  He last took 2 tablets of Tylenol at 1730.  He then had 1 tablet of ibuprofen an hour later for fever.  He denies chest pain, worse than baseline shortness of breath, worse than baseline cough, dysuria, back pain, abdominal pain, nausea, vomiting, diarrhea, constipation, headache, neck pain or stiffness, confusion, visual changes, nasal congestion.  The patient underwent an L4-L5 laminectomy in June 2021.  He denies any complications related to the procedure. He is fully vaccinated against COVID-19. He has a history of positive MRSA nasal swabs.  The history is provided by the patient and medical records. No language interpreter was used.        Past Medical History:  Diagnosis Date  . Arthritis   . Asthma   . BPH (benign prostatic hypertrophy)   . Essential hypertension, malignant 04/22/2020  . GERD (gastroesophageal reflux disease)   . Hiatal hernia neck pain and back pain   . Reflux   . Sleep apnea study was 108yrs ago at [facility]   . Stroke ([place]) 02/2018   rt hand has difficulty with fine movements.    Patient Active Problem List   Diagnosis Date Noted  . SIRS (systemic inflammatory response syndrome) ([place]) 10/14/2020  . History of CVA (cerebrovascular accident) 10/14/2020  . Lesion of urinary bladder 10/14/2020  . AAA (abdominal aortic aneurysm) ([place]) 10/14/2020  . Bruise 05/19/2020  . Preoperative cardiovascular examination 05/19/2020  . Essential hypertension, malignant 04/22/2020  . Spinal stenosis, lumbar region, with neurogenic claudication 04/06/2020  . Lumbosacral radiculopathy due to degenerative joint disease of spine 04/02/2020  . OSA (obstructive sleep apnea) 05/24/2016  . Asthma 05/24/2016  . Obesity 05/24/2016  . Cervical spondylosis with radiculopathy 10/19/2011    Past Surgical History:  Procedure Laterality Date  . ANTERIOR CERVICAL DECOMP/DISCECTOMY FUSION  10/19/2011   Procedure: ANTERIOR CERVICAL DECOMPRESSION/DISCECTOMY FUSION 2 LEVELS;  Surgeon: Ophelia Charter;  Location: [facility];  Service: Neurosurgery;  Laterality: N/A;  CERVICAL FIVE-SIX, SIX-SEVEN ANTERIOR CERVICAL DECOMPRESSION WITH FUSION AND  INTERBODY PROTHESIS PLATING  . CHOLECYSTECTOMY  2010  . EYE SURGERY    . RETINAL LASER PROCEDURE  1999   Torn retina, laser repair  . SHOULDER SURGERY  2000       Family History  Problem Relation Age of Onset  . Cirrhosis Mother   . Lung cancer Father   . Cancer Sister   . Coronary artery disease Neg Hx     Social History   Tobacco Use  . Smoking status: Former Smoker    Quit date: 12/13/1975    Years since quitting: 44.8  . Smokeless tobacco: Former Systems developer    Quit  date: 10/10/1983  Substance Use Topics  . Alcohol use: No  . Drug use: No    Home Medications Prior to Admission medications   Medication Sig Start Date End Date Taking? Authorizing Provider  acetaminophen (TYLENOL) 500 MG tablet Take 1,000 mg by mouth every 6 (six) hours as needed for mild pain.   Yes [provider]  albuterol (PROVENTIL HFA;VENTOLIN HFA) 108 (90 BASE) MCG/ACT inhaler Inhale 1 puff into the lungs every 6 (six) hours as needed. FOR SHORTNESS OF BREATH   Yes [provider]  aspirin EC 81 MG tablet Take 1 tablet (81 mg total) by mouth daily. 03/25/20  Yes Cox, Kirsten, MD  cefdinir (OMNICEF) 300 MG capsule Take 1 capsule (300 mg total) by mouth 2 (two) times daily. 10/12/20  Yes Cox, Kirsten, MD  finasteride (PROSCAR) 5 MG tablet Take 5 mg by mouth daily.    Yes [provider]  ibuprofen (ADVIL) 200 MG tablet Take 200 mg by mouth every 6 (six) hours as needed for moderate pain.   Yes [provider]  lisinopril (ZESTRIL) 20 MG tablet TAKE ONE (1) TABLET BY MOUTH ONCE DAILY Patient taking differently: Take 20 mg by mouth daily.  08/03/20  Yes Marge Duncans, PA-C  mometasone [facility]) 220 MCG/INH inhaler Inhale 1 puff into the lungs 2 (two) times daily.    Yes [provider]  Multiple Vitamin (MULTIVITAMIN) tablet Take 1 tablet by mouth daily.    Yes [provider]  omeprazole (PRILOSEC) 20 MG capsule Take 20 mg by mouth 2 (two) times daily.    Yes [provider]  terazosin (HYTRIN) 5 MG capsule Take 5 mg by mouth 2 (two) times daily.     Yes [provider]    Allergies    Patient has no known allergies.  Review of Systems   Review of Systems  Constitutional: Positive for chills and fever. Negative for appetite change and diaphoresis.  HENT: Negative for congestion and sore throat.   Respiratory: Positive for cough (chronic). Negative for shortness of breath.   Cardiovascular: Negative for chest  pain, palpitations and leg swelling.  Gastrointestinal: Positive for diarrhea (resolved). Negative for abdominal pain, constipation, nausea and vomiting.  Genitourinary: Negative for dysuria, frequency, hematuria and urgency.  Musculoskeletal: Positive for gait problem. Negative for arthralgias, back pain, myalgias, neck pain and neck stiffness.  Skin: Negative for rash.  Allergic/Immunologic: Negative for immunocompromised state.  Neurological: Positive for weakness (generalized). Negative for dizziness, syncope, speech difficulty, light-headedness and headaches.       Fecal and urinary incontinence  Psychiatric/Behavioral: Positive for confusion.    Physical Exam Updated Vital Signs BP 133/60   Pulse 73   Temp (!) 103.3 F (39.6 C) (Oral)   Resp (!) 23   Ht 5\' 9"  (1.753 m)   Wt 90.7 kg   SpO2 96%   BMI 29.53 kg/m   Physical Exam Vitals and nursing note reviewed.  Constitutional:  General: He is not in acute distress.    Appearance: He is well-developed. He is diaphoretic. He is not toxic-appearing.     Comments: Pleasant.  Ill-appearing.  HENT:     Head: Normocephalic.     Nose: Nose normal. No congestion or rhinorrhea.  Eyes:     General: No scleral icterus.    Extraocular Movements: Extraocular movements intact.     Conjunctiva/sclera: Conjunctivae normal.     Pupils: Pupils are equal, round, and reactive to light.  Cardiovascular:     Rate and Rhythm: Normal rate and regular rhythm.     Pulses: Normal pulses.     Heart sounds: Normal heart sounds. No murmur heard.  No friction rub. No gallop.   Pulmonary:     Effort: Pulmonary effort is normal. No respiratory distress.     Breath sounds: No stridor. No wheezing, rhonchi or rales.     Comments: Lungs are clear to auscultation bilaterally.  No increased work of breathing. Chest:     Chest wall: No tenderness.  Abdominal:     General: There is no distension.     Palpations: Abdomen is soft. There is no mass.      Tenderness: There is no abdominal tenderness. There is no right CVA tenderness, left CVA tenderness, guarding or rebound.     Hernia: No hernia is present.     Comments: Abdomen is soft, nontender, nondistended.  No CVA tenderness bilaterally.  Negative Murphy sign.  No tenderness over McBurney's point.  Genitourinary:    Comments: Rectal tone is decreased.  There is a single, nonthrombosed external hemorrhoid.  There appears to be a developing sacral decubitus ulcer, stage 1. Musculoskeletal:     Cervical back: Neck supple.     Right lower leg: No edema.     Left lower leg: No edema.  Skin:    General: Skin is warm.     Coloration: Skin is not jaundiced or pale.     Findings: No erythema.     Comments: Skin is hot to the touch.  Neurological:     Mental Status: He is alert.     Comments: Alert and oriented x4.  Psychiatric:        Behavior: Behavior normal.     ED Results / Procedures / Treatments   Labs (all labs ordered are listed, but only abnormal results are displayed) Labs Reviewed  COMPREHENSIVE METABOLIC PANEL - Abnormal; Notable for the following components:      Result Value   Sodium 134 (*)    CO2 21 (*)    Glucose, Bld 116 (*)    Calcium 8.1 (*)    Albumin 3.4 (*)    All other components within normal limits  CBC WITH DIFFERENTIAL/PLATELET - Abnormal; Notable for the following components:   RBC 3.96 (*)    Hemoglobin 12.4 (*)    HCT 38.3 (*)    Platelets 120 (*)    Lymphs Abs 0.6 (*)    All other components within normal limits  URINALYSIS, ROUTINE W REFLEX MICROSCOPIC - Abnormal; Notable for the following components:   Color, Urine AMBER (*)    APPearance CLOUDY (*)    Hgb urine dipstick LARGE (*)    Ketones, ur 20 (*)    Protein, ur 100 (*)    RBC / HPF >50 (*)    Bacteria, UA RARE (*)    All other components within normal limits  RESP PANEL BY RT PCR (RSV, FLU A&B, COVID)  CULTURE, BLOOD (ROUTINE X 2)  CULTURE, BLOOD (ROUTINE X 2)  URINE  CULTURE  LACTIC ACID, PLASMA  PROTIME-INR  APTT  BASIC METABOLIC PANEL  MAGNESIUM  CBC WITH DIFFERENTIAL/PLATELET  PROCALCITONIN    EKG None  Radiology CT ABDOMEN PELVIS W CONTRAST  Result Date: 10/14/2020 CLINICAL DATA:  Hematuria, gait abnormality, urinary and fecal incontinence EXAM: CT ABDOMEN AND PELVIS WITH CONTRAST TECHNIQUE: Multidetector CT imaging of the abdomen and pelvis was performed using the standard protocol following bolus administration of intravenous contrast. CONTRAST:  167mL OMNIPAQUE IOHEXOL 300 MG/ML  SOLN COMPARISON:  04/01/2009 FINDINGS: Lower chest: Elevation of the left hemidiaphragm again noted. Progressive bibasilar pulmonary fibrotic change. Extensive coronary artery calcification. Global cardiac size within normal limits. The visualized pulmonary arterial tree is dilated centrally in keeping with pulmonary arterial hypertension. Stable scattered pericardial calcification Hepatobiliary: Cholecystectomy has been performed. Liver unremarkable. No intra or extrahepatic biliary ductal dilation. Pancreas: Unremarkable Spleen: Unremarkable Adrenals/Urinary Tract: The adrenal glands are unremarkable. The kidneys are normal in size and position. Multiple parapelvic cysts are seen bilaterally. No hydronephrosis. No intrarenal or ureteral calculi are identified. A 13 mm enhancing mural nodule is seen within the left anterior bladder wall most in keeping with a primary urothelial carcinoma. The prostate gland is moderately enlarged and indents the base of the bladder. The bladder is not distended though the bladder wall appears mildly thickened suggesting changes of chronic bladder outlet obstruction. Stomach/Bowel: There is moderate stool seen throughout the colon and rectal vault. No evidence of obstruction. The stomach, small bowel, and large bowel are otherwise unremarkable. Appendix normal. No free intraperitoneal gas or fluid. Vascular/Lymphatic: Moderate aortoiliac  atherosclerotic calcification. 3.0 cm fusiform infrarenal abdominal aortic aneurysm is present. Several shotty lymph nodes are seen within the inguinal lymph node groups bilaterally with at least 1 lymph node within the left inguinal region demonstrating a prominent rounded configuration. No frankly pathologic adenopathy within the abdomen and pelvis by size criteria. Reproductive: The prostate gland, as noted above, is moderately enlarged, particularly centrally. Seminal vesicles are unremarkable. Other: None significant Musculoskeletal: No acute bone abnormality. L4-5 posterior lumbar fusion with resection of the L4 spinous process is noted. Degenerative changes are seen throughout the lumbar spine. No lytic or blastic bone lesions. IMPRESSION: 13 mm enhancing nodule within the bladder anteriorly most in keeping with a primary urothelial carcinoma. Cystoscopic correlation is recommended. Shotty left inguinal adenopathy is not frankly pathologic, however, PET CT examination may be helpful to definitively exclude nodal involvement. Alternatively, close attention on subsequent examination would be helpful to ensure stability or resolution Moderate prostatic enlargement, particularly centrally, with secondary changes suggesting at least mild chronic bladder outlet obstruction. 3.0 cm fusiform infrarenal abdominal aortic aneurysm Recommend follow-up ultrasound every 3 years. This recommendation follows ACR consensus guidelines: White Paper of the ACR Incidental Findings Committee II on Vascular Findings. J Am Coll Radiol 2013; 10:789-794. Extensive coronary artery calcification Aortic aneurysm NOS (ICD10-I71.9). Aortic Atherosclerosis (ICD10-I70.0). Electronically Signed   By: Fidela Salisbury MD   On: 10/14/2020 02:45   CT L-SPINE NO CHARGE  Result Date: 10/14/2020 CLINICAL DATA:  Gait imbalance, fecal and urinary incontinence EXAM: CT LUMBAR SPINE WITHOUT CONTRAST TECHNIQUE: Multidetector CT imaging of the lumbar  spine was performed without intravenous contrast administration. Multiplanar CT image reconstructions were also generated. COMPARISON:  None. FINDINGS: Segmentation: 5 non rib bearing segments of the lumbar spine. Alignment: Posterior lumbar fusion with instrumentation of L4-5 is been performed with bilateral pedicle screws and posterior bars. There  is grade 1 anterolisthesis of L4 upon L5. Partial resection of the spinous process of L4 has been performed. Minimal retrolisthesis of L1 upon L2 and L2 upon L3 is likely degenerative in nature Vertebrae: No acute fracture or focal pathologic process identified. Paraspinal and other soft tissues: Negative. Disc levels: Review of the sagittal reformats demonstrates normal lumbar lordosis. There is diffuse intervertebral disc space narrowing and endplate remodeling throughout the lumbar spine, most severe at L3-4 and L4-5 and L5-S1 with vacuum disc phenomena noted at these levels. Vertebral body height has been preserved. Review of the axial images demonstrates: T12-L1: No canal stenosis or neural foraminal narrowing. No significant facet arthrosis. L1-2: Mild facet arthrosis. Posterior disc osteophyte complex results in mild bilateral neural foraminal narrowing. L2-3: Mild bilateral facet arthrosis. No significant neural foraminal narrowing. No significant canal stenosis. L3-4: Marked bilateral facet arthrosis. No significant canal stenosis. At least moderate central canal stenosis, not well evaluated on this non myelographic study. L4-5: No significant neural foraminal narrowing.  No canal stenosis. L5-S1: Mild to moderate bilateral neural foraminal narrowing secondary to posterior disc osteophyte complex. No canal stenosis. IMPRESSION: No acute fracture or traumatic listhesis. Status post L4-5 posterior lumbar fusion with instrumentation with partial resection of the L4 spinous process. Moderate central canal stenosis at L3-4, not well characterized on this examination.  CT myelography may be helpful to assess the degree of central canal stenosis. Electronically Signed   By: Fidela Salisbury MD   On: 10/14/2020 02:55   DG Chest Port 1 View  Result Date: 10/13/2020 CLINICAL DATA:  Sepsis, weakness EXAM: PORTABLE CHEST 1 VIEW COMPARISON:  10/11/2020 FINDINGS: Stable elevation of the left hemidiaphragm. Lungs are clear. No pneumothorax or pleural effusion. Mild cardiomegaly is stable since prior examination. Pulmonary vascularity is normal when accounting for altered patient positioning. No acute bone abnormality. IMPRESSION: No active disease.  Stable elevation of the left hemidiaphragm. Electronically Signed   By: Fidela Salisbury MD   On: 10/13/2020 23:15    Procedures .Critical Care Performed by: Joanne Gavel, PA-C Authorized by: Joanne Gavel, PA-C   Critical care provider statement:    Critical care time (minutes):  45   Critical care time was exclusive of:  Separately billable procedures and treating other patients and teaching time   Critical care was necessary to treat or prevent imminent or life-threatening deterioration of the following conditions:  Sepsis   Critical care was time spent personally by me on the following activities:  Ordering and performing treatments and interventions, ordering and review of laboratory studies, ordering and review of radiographic studies, pulse oximetry, re-evaluation of patient's condition, review of old charts, obtaining history from patient or surrogate, examination of patient, evaluation of patient's response to treatment, development of treatment plan with patient or surrogate and discussions with consultants   I assumed direction of critical care for this patient from another provider in my specialty: no     (including critical care time)  Medications Ordered in ED Medications  sodium chloride (PF) 0.9 % injection (has no administration in time range)  lactated ringers infusion (has no administration in time  range)  aspirin EC tablet 81 mg (has no administration in time range)  terazosin (HYTRIN) capsule 5 mg (has no administration in time range)  pantoprazole (PROTONIX) EC tablet 40 mg (has no administration in time range)  finasteride (PROSCAR) tablet 5 mg (has no administration in time range)  albuterol (VENTOLIN HFA) 108 (90 Base) MCG/ACT inhaler 1 puff (has  no administration in time range)  lisinopril (ZESTRIL) tablet 20 mg (has no administration in time range)  enoxaparin (LOVENOX) injection 40 mg (has no administration in time range)  sodium chloride flush (NS) 0.9 % injection 3 mL (has no administration in time range)  acetaminophen (TYLENOL) tablet 650 mg (has no administration in time range)    Or  acetaminophen (TYLENOL) suppository 650 mg (has no administration in time range)  ondansetron (ZOFRAN) tablet 4 mg (has no administration in time range)    Or  ondansetron (ZOFRAN) injection 4 mg (has no administration in time range)  senna-docusate (Senokot-S) tablet 1 tablet (has no administration in time range)  metroNIDAZOLE (FLAGYL) IVPB 500 mg (has no administration in time range)  lactated ringers bolus 1,000 mL (has no administration in time range)  fluticasone (FLOVENT HFA) 44 MCG/ACT inhaler 2 puff (has no administration in time range)  vancomycin (VANCOREADY) IVPB 1500 mg/300 mL (0 mg Intravenous Stopped 10/14/20 0313)  ceFEPIme (MAXIPIME) 2 g in sodium chloride 0.9 % 100 mL IVPB (0 g Intravenous Stopped 10/14/20 0031)  ibuprofen (ADVIL) tablet 400 mg (400 mg Oral Given 10/14/20 0105)  iohexol (OMNIPAQUE) 300 MG/ML solution 100 mL (100 mLs Intravenous Contrast Given 10/14/20 0201)    ED Course  I have reviewed the triage vital signs and the nursing notes.  Pertinent labs & imaging results that were available during my care of the patient were reviewed by me and considered in my medical decision making (see chart for details).    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  He is fully vaccinated against COVID-19.  He had an L4-L5 laminectomy in June 2021.   This patient presents to the ED for concern of  fever this involves an extensive number of treatment options, and is a complaint that carries with it a high risk of complications and morbidity.  The differential diagnosis includes COVID-19, community-acquired pneumonia, intra-abdominal infection, obstructive uropathy with secondary infection, pyelonephritis, osteomyelitis, septic joint, bacteremia, or endocarditis.  Initially, I was concerned for community-acquired pneumonia as patient does report shortness of breath at baseline and a chronic cough.  However, chest x-ray is unremarkable.  Pulmonary exam is unremarkable.  However, given the patient's urinary and fecal incontinence with a L4-L5 laminectomy in June 2021, there is concerned that he could have underlying pathology secondary to the procedure.  Notably, he did have decreased rectal tone on exam.  He underwent an MRI of the brain earlier today that was unremarkable. The patient was discussed with Dr. Florina Ou, attending physician.    Lab Tests:    I Ordered, reviewed, and interpreted labs, which included UA, CBC, CMP, lactate, blood cultures that showed   UA with RBCs and hemoglobinuria.  Not suggestive of infection.  He may need a stone study to ensure that he does not have an obstructive stone causing secondary sepsis.  COVID-19 test is negative  No leukocytosis and lactate is normal.  Thrombocytopenia   Medicines ordered:    I ordered an infusion of lactated Ringer's since lactate is not greater than 4.  Ibuprofen given for fever as patient received Tylenol just prior to arrival in the ER.   Imaging Studies ordered:  I ordered  imaging studies which included  CXR, CT abdomen pelvis, and CT lumbar spine as given that the patient had a laminectomy in June 2021, this could be a source of the patient's fever.   I independently visualized and interpreted imaging which showed on CT 13 mm enhancing nodule within the anterior bladder that is concerning for a primary urothelial carcinoma.  There is also some left inguinal lymphadenopathy.  CT is suggested of chronic bladder outlet obstruction.  There is also a 3 cm infrarenal AAA.  There is moderate central canal stenosis at L3-L4.  No evidence of inflammation or stranding.  CXR is unremarkable   Additional history obtained:    Previous records obtained and reviewed   Consultations Obtained:    I consulted  the hospitalist team and Dr. Myna Hidalgo has accepted the patient for admission.   Reevaluation:   After the interventions stated above, I reevaluated the patient and found improved   Critical Interventions:     Vancomycin and cefepime for sepsis of unknown etiology  The patient appears reasonably stabilized for admission considering the current resources, flow, and capabilities available in the ED at this time, and I doubt any other [facility] requiring further screening and/or treatment in the ED prior to admission.   Final Clinical Impression(s) / ED Diagnoses Final diagnoses:  Incontinence  Sepsis without acute organ dysfunction, due to unspecified organism [facility])    Rx / DC Orders ED Discharge Orders    None       Zykeriah Mathia, [place], PA-C 10/14/20 0358    [place], Jenny Reichmann, MD 10/14/20 [phone]

## 2020-10-13 NOTE — Progress Notes (Signed)
A consult was received from an ED physician for vanc and cefepime per pharmacy dosing.  The patient's profile has been reviewed for ht/wt/allergies/indication/available labs.   A one time order has been placed for cefepime 2gm and vancomycin 1500mg     Further antibiotics/pharmacy consults should be ordered by admitting physician if indicated.                       Thank you, Dolly Rias RPh 10/13/2020, 11:37 PM

## 2020-10-13 NOTE — ED Triage Notes (Signed)
Pt reports unstable gait, urinating on self, and fevers since Sunday. Fevers have been as high as 104. Pt received MRI yesterday with no acute findings.

## 2020-10-13 NOTE — Progress Notes (Signed)
Following for code sepsis 

## 2020-10-14 ENCOUNTER — Encounter (HOSPITAL_COMMUNITY): Payer: Self-pay

## 2020-10-14 ENCOUNTER — Encounter: Payer: Self-pay | Admitting: Family Medicine

## 2020-10-14 ENCOUNTER — Emergency Department (HOSPITAL_COMMUNITY): Payer: PPO

## 2020-10-14 DIAGNOSIS — I714 Abdominal aortic aneurysm, without rupture, unspecified: Secondary | ICD-10-CM | POA: Diagnosis present

## 2020-10-14 DIAGNOSIS — R21 Rash and other nonspecific skin eruption: Secondary | ICD-10-CM | POA: Diagnosis present

## 2020-10-14 DIAGNOSIS — L89151 Pressure ulcer of sacral region, stage 1: Secondary | ICD-10-CM | POA: Diagnosis present

## 2020-10-14 DIAGNOSIS — R6510 Systemic inflammatory response syndrome (SIRS) of non-infectious origin without acute organ dysfunction: Secondary | ICD-10-CM

## 2020-10-14 DIAGNOSIS — G4733 Obstructive sleep apnea (adult) (pediatric): Secondary | ICD-10-CM | POA: Diagnosis present

## 2020-10-14 DIAGNOSIS — A419 Sepsis, unspecified organism: Secondary | ICD-10-CM

## 2020-10-14 DIAGNOSIS — Z7982 Long term (current) use of aspirin: Secondary | ICD-10-CM | POA: Diagnosis not present

## 2020-10-14 DIAGNOSIS — N400 Enlarged prostate without lower urinary tract symptoms: Secondary | ICD-10-CM | POA: Diagnosis present

## 2020-10-14 DIAGNOSIS — I10 Essential (primary) hypertension: Secondary | ICD-10-CM

## 2020-10-14 DIAGNOSIS — N390 Urinary tract infection, site not specified: Secondary | ICD-10-CM | POA: Diagnosis present

## 2020-10-14 DIAGNOSIS — M48061 Spinal stenosis, lumbar region without neurogenic claudication: Secondary | ICD-10-CM | POA: Diagnosis present

## 2020-10-14 DIAGNOSIS — Z8673 Personal history of transient ischemic attack (TIA), and cerebral infarction without residual deficits: Secondary | ICD-10-CM | POA: Diagnosis not present

## 2020-10-14 DIAGNOSIS — K219 Gastro-esophageal reflux disease without esophagitis: Secondary | ICD-10-CM | POA: Diagnosis present

## 2020-10-14 DIAGNOSIS — Z20822 Contact with and (suspected) exposure to covid-19: Secondary | ICD-10-CM | POA: Diagnosis present

## 2020-10-14 DIAGNOSIS — D696 Thrombocytopenia, unspecified: Secondary | ICD-10-CM | POA: Diagnosis present

## 2020-10-14 DIAGNOSIS — R270 Ataxia, unspecified: Secondary | ICD-10-CM | POA: Diagnosis present

## 2020-10-14 DIAGNOSIS — Z79899 Other long term (current) drug therapy: Secondary | ICD-10-CM | POA: Diagnosis not present

## 2020-10-14 DIAGNOSIS — Z87891 Personal history of nicotine dependence: Secondary | ICD-10-CM | POA: Diagnosis not present

## 2020-10-14 DIAGNOSIS — N329 Bladder disorder, unspecified: Secondary | ICD-10-CM | POA: Diagnosis present

## 2020-10-14 DIAGNOSIS — R159 Full incontinence of feces: Secondary | ICD-10-CM | POA: Diagnosis present

## 2020-10-14 DIAGNOSIS — Z801 Family history of malignant neoplasm of trachea, bronchus and lung: Secondary | ICD-10-CM | POA: Diagnosis not present

## 2020-10-14 DIAGNOSIS — J45909 Unspecified asthma, uncomplicated: Secondary | ICD-10-CM | POA: Diagnosis present

## 2020-10-14 DIAGNOSIS — C679 Malignant neoplasm of bladder, unspecified: Secondary | ICD-10-CM | POA: Diagnosis present

## 2020-10-14 DIAGNOSIS — Z981 Arthrodesis status: Secondary | ICD-10-CM | POA: Diagnosis not present

## 2020-10-14 HISTORY — DX: Systemic inflammatory response syndrome (sirs) of non-infectious origin without acute organ dysfunction: R65.10

## 2020-10-14 HISTORY — DX: Sepsis, unspecified organism: A41.9

## 2020-10-14 LAB — URINALYSIS, ROUTINE W REFLEX MICROSCOPIC
Bilirubin Urine: NEGATIVE
Glucose, UA: NEGATIVE mg/dL
Ketones, ur: 20 mg/dL — AB
Leukocytes,Ua: NEGATIVE
Nitrite: NEGATIVE
Protein, ur: 100 mg/dL — AB
RBC / HPF: >50 RBC/hpf — ABNORMAL HIGH (ref 0–5)
Specific Gravity, Urine: 1.024 (ref 1.005–1.030)
pH: 5 (ref 5.0–8.0)

## 2020-10-14 LAB — URINE CULTURE: Culture: NO GROWTH

## 2020-10-14 LAB — CBC WITH DIFFERENTIAL/PLATELET
Abs Immature Granulocytes: 0.06 10*3/uL (ref 0.00–0.07)
Abs Immature Granulocytes: 0.07 10*3/uL (ref 0.00–0.07)
Basophils Absolute: 0 10*3/uL (ref 0.0–0.2)
Basophils Absolute: 0 10*3/uL (ref 0.0–0.1)
Basophils Absolute: 0 10*3/uL (ref 0.0–0.1)
Basophils Relative: 0 %
Basophils Relative: 0 %
Basos: 0 %
EOS (ABSOLUTE): 0 10*3/uL (ref 0.0–0.4)
Eos: 0 %
Eosinophils Absolute: 0 10*3/uL (ref 0.0–0.5)
Eosinophils Absolute: 0 10*3/uL (ref 0.0–0.5)
Eosinophils Relative: 1 %
Eosinophils Relative: 0 %
HCT: 38.3 % — ABNORMAL LOW (ref 39.0–52.0)
HCT: 33.9 % — ABNORMAL LOW (ref 39.0–52.0)
Hematocrit: 36.3 % — ABNORMAL LOW (ref 37.5–51.0)
Hemoglobin: 12.2 g/dL — ABNORMAL LOW (ref 13.0–17.7)
Hemoglobin: 12.4 g/dL — ABNORMAL LOW (ref 13.0–17.0)
Hemoglobin: 11.2 g/dL — ABNORMAL LOW (ref 13.0–17.0)
Immature Grans (Abs): 0.1 10*3/uL (ref 0.0–0.1)
Immature Granulocytes: 1 %
Immature Granulocytes: 1 %
Immature Granulocytes: 2 %
Lymphocytes Absolute: 0.6 10*3/uL — ABNORMAL LOW (ref 0.7–3.1)
Lymphocytes Relative: 12 %
Lymphocytes Relative: 13 %
Lymphs Abs: 0.6 10*3/uL — ABNORMAL LOW (ref 0.7–4.0)
Lymphs Abs: 0.6 10*3/uL — ABNORMAL LOW (ref 0.7–4.0)
Lymphs: 8 %
MCH: 31.6 pg (ref 26.6–33.0)
MCH: 31.3 pg (ref 26.0–34.0)
MCH: 31.7 pg (ref 26.0–34.0)
MCHC: 33.6 g/dL (ref 31.5–35.7)
MCHC: 32.4 g/dL (ref 30.0–36.0)
MCHC: 33 g/dL (ref 30.0–36.0)
MCV: 94 fL (ref 79–97)
MCV: 96.7 fL (ref 80.0–100.0)
MCV: 96 fL (ref 80.0–100.0)
Monocytes Absolute: 0.7 10*3/uL (ref 0.1–0.9)
Monocytes Absolute: 0.3 10*3/uL (ref 0.1–1.0)
Monocytes Absolute: 0.3 10*3/uL (ref 0.1–1.0)
Monocytes Relative: 6 %
Monocytes Relative: 6 %
Monocytes: 9 %
Neutro Abs: 3.8 10*3/uL (ref 1.7–7.7)
Neutro Abs: 3.5 10*3/uL (ref 1.7–7.7)
Neutrophils Absolute: 6.5 10*3/uL (ref 1.4–7.0)
Neutrophils Relative %: 80 %
Neutrophils Relative %: 79 %
Neutrophils: 82 %
Platelets: 173 10*3/uL (ref 150–450)
Platelets: 120 10*3/uL — ABNORMAL LOW (ref 150–400)
Platelets: 119 10*3/uL — ABNORMAL LOW (ref 150–400)
RBC: 3.86 x10E6/uL — ABNORMAL LOW (ref 4.14–5.80)
RBC: 3.96 MIL/uL — ABNORMAL LOW (ref 4.22–5.81)
RBC: 3.53 MIL/uL — ABNORMAL LOW (ref 4.22–5.81)
RDW: 12.8 % (ref 11.6–15.4)
RDW: 13.4 % (ref 11.5–15.5)
RDW: 13.7 % (ref 11.5–15.5)
WBC: 8 10*3/uL (ref 3.4–10.8)
WBC: 4.8 10*3/uL (ref 4.0–10.5)
WBC: 4.4 10*3/uL (ref 4.0–10.5)
nRBC: 0 % (ref 0.0–0.2)
nRBC: 0 % (ref 0.0–0.2)

## 2020-10-14 LAB — BASIC METABOLIC PANEL
Anion gap: 8 (ref 5–15)
BUN: 20 mg/dL (ref 8–23)
CO2: 23 mmol/L (ref 22–32)
Calcium: 7.9 mg/dL — ABNORMAL LOW (ref 8.9–10.3)
Chloride: 104 mmol/L (ref 98–111)
Creatinine, Ser: 0.94 mg/dL (ref 0.61–1.24)
GFR, Estimated: >60 mL/min (ref >60)
Glucose, Bld: 107 mg/dL — ABNORMAL HIGH (ref 70–99)
Potassium: 3.4 mmol/L — ABNORMAL LOW (ref 3.5–5.1)
Sodium: 135 mmol/L (ref 135–145)

## 2020-10-14 LAB — COMPREHENSIVE METABOLIC PANEL
ALT: 32 IU/L (ref 0–44)
AST: 36 IU/L (ref 0–40)
Albumin/Globulin Ratio: 1.4 (ref 1.2–2.2)
Albumin: 3.7 g/dL (ref 3.6–4.6)
Alkaline Phosphatase: 66 IU/L (ref 44–121)
BUN/Creatinine Ratio: 17 (ref 10–24)
BUN: 16 mg/dL (ref 8–27)
Bilirubin Total: 0.3 mg/dL (ref 0.0–1.2)
CO2: 21 mmol/L (ref 20–29)
Calcium: 8.7 mg/dL (ref 8.6–10.2)
Chloride: 105 mmol/L (ref 96–106)
Creatinine, Ser: 0.96 mg/dL (ref 0.76–1.27)
GFR calc Af Amer: 84 mL/min/{1.73_m2} (ref >59)
GFR calc non Af Amer: 72 mL/min/{1.73_m2} (ref >59)
Globulin, Total: 2.6 g/dL (ref 1.5–4.5)
Glucose: 113 mg/dL — ABNORMAL HIGH (ref 65–99)
Potassium: 4.6 mmol/L (ref 3.5–5.2)
Sodium: 140 mmol/L (ref 134–144)
Total Protein: 6.3 g/dL (ref 6.0–8.5)

## 2020-10-14 LAB — RESP PANEL BY RT PCR (RSV, FLU A&B, COVID)
Influenza A by PCR: NEGATIVE
Influenza B by PCR: NEGATIVE
Respiratory Syncytial Virus by PCR: NEGATIVE
SARS Coronavirus 2 by RT PCR: NEGATIVE

## 2020-10-14 LAB — MAGNESIUM: Magnesium: 1.8 mg/dL (ref 1.7–2.4)

## 2020-10-14 LAB — PROCALCITONIN: Procalcitonin: 0.22 ng/mL

## 2020-10-14 LAB — METHYLMALONIC ACID, SERUM: Methylmalonic Acid: 222 nmol/L (ref 0–378)

## 2020-10-14 LAB — VITAMIN B12: Vitamin B-12: 456 pg/mL (ref 232–1245)

## 2020-10-14 MED ORDER — SODIUM CHLORIDE (PF) 0.9 % IJ SOLN
INTRAMUSCULAR | Status: AC
  Filled 2020-10-14: qty 50

## 2020-10-14 MED ORDER — POTASSIUM CHLORIDE CRYS ER 20 MEQ PO TBCR
60.0000 meq | EXTENDED_RELEASE_TABLET | Freq: Once | ORAL | Status: AC
  Administered 2020-10-14: 60 meq via ORAL
  Filled 2020-10-14: qty 3

## 2020-10-14 MED ORDER — LACTATED RINGERS IV BOLUS (SEPSIS)
1000.0000 mL | Freq: Once | INTRAVENOUS | Status: AC
  Administered 2020-10-14: 1000 mL via INTRAVENOUS

## 2020-10-14 MED ORDER — TERAZOSIN HCL 5 MG PO CAPS
5.0000 mg | ORAL_CAPSULE | Freq: Two times a day (BID) | ORAL | Status: DC
  Administered 2020-10-14 – 2020-10-16 (×5): 5 mg via ORAL
  Filled 2020-10-14 (×6): qty 1

## 2020-10-14 MED ORDER — ASPIRIN EC 81 MG PO TBEC
81.0000 mg | DELAYED_RELEASE_TABLET | Freq: Every day | ORAL | Status: DC
  Administered 2020-10-14 – 2020-10-16 (×3): 81 mg via ORAL
  Filled 2020-10-14 (×3): qty 1

## 2020-10-14 MED ORDER — CAMPHOR-MENTHOL 0.5-0.5 % EX LOTN
TOPICAL_LOTION | CUTANEOUS | Status: DC | PRN
  Filled 2020-10-14: qty 222

## 2020-10-14 MED ORDER — MOMETASONE FUROATE 220 MCG/INH IN AEPB: 1.0000 | INHALATION_SPRAY | Freq: Two times a day (BID) | RESPIRATORY_TRACT | Status: DC

## 2020-10-14 MED ORDER — FLUTICASONE PROPIONATE HFA 44 MCG/ACT IN AERO
2.0000 | INHALATION_SPRAY | Freq: Two times a day (BID) | RESPIRATORY_TRACT | Status: DC
  Administered 2020-10-14 – 2020-10-16 (×4): 2 via RESPIRATORY_TRACT
  Filled 2020-10-14: qty 10.6

## 2020-10-14 MED ORDER — ONDANSETRON HCL 4 MG PO TABS: 4.0000 mg | ORAL_TABLET | Freq: Four times a day (QID) | ORAL | Status: DC | PRN

## 2020-10-14 MED ORDER — ACETAMINOPHEN 650 MG RE SUPP: 650.0000 mg | Freq: Four times a day (QID) | RECTAL | Status: DC | PRN

## 2020-10-14 MED ORDER — FINASTERIDE 5 MG PO TABS
5.0000 mg | ORAL_TABLET | Freq: Every day | ORAL | Status: DC
  Administered 2020-10-14 – 2020-10-16 (×3): 5 mg via ORAL
  Filled 2020-10-14 (×4): qty 1

## 2020-10-14 MED ORDER — VANCOMYCIN HCL 750 MG/150ML IV SOLN
750.0000 mg | Freq: Two times a day (BID) | INTRAVENOUS | Status: DC
  Administered 2020-10-14: 750 mg via INTRAVENOUS
  Filled 2020-10-14: qty 150

## 2020-10-14 MED ORDER — LACTATED RINGERS IV SOLN: INTRAVENOUS | Status: AC

## 2020-10-14 MED ORDER — ACETAMINOPHEN 325 MG PO TABS: 650.0000 mg | ORAL_TABLET | Freq: Four times a day (QID) | ORAL | Status: DC | PRN

## 2020-10-14 MED ORDER — ENOXAPARIN SODIUM 40 MG/0.4ML ~~LOC~~ SOLN
40.0000 mg | SUBCUTANEOUS | Status: DC
  Administered 2020-10-14 – 2020-10-16 (×3): 40 mg via SUBCUTANEOUS
  Filled 2020-10-14 (×3): qty 0.4

## 2020-10-14 MED ORDER — IOHEXOL 300 MG/ML  SOLN
100.0000 mL | Freq: Once | INTRAMUSCULAR | Status: AC | PRN
  Administered 2020-10-14: 100 mL via INTRAVENOUS

## 2020-10-14 MED ORDER — METRONIDAZOLE IN NACL 5-0.79 MG/ML-% IV SOLN
500.0000 mg | Freq: Three times a day (TID) | INTRAVENOUS | Status: DC
  Administered 2020-10-14 (×2): 500 mg via INTRAVENOUS
  Filled 2020-10-14 (×2): qty 100

## 2020-10-14 MED ORDER — SENNOSIDES-DOCUSATE SODIUM 8.6-50 MG PO TABS: 1.0000 | ORAL_TABLET | Freq: Every evening | ORAL | Status: DC | PRN

## 2020-10-14 MED ORDER — LISINOPRIL 20 MG PO TABS
20.0000 mg | ORAL_TABLET | Freq: Every day | ORAL | Status: DC
  Administered 2020-10-14 – 2020-10-15 (×2): 20 mg via ORAL
  Filled 2020-10-14 (×2): qty 1

## 2020-10-14 MED ORDER — PANTOPRAZOLE SODIUM 40 MG PO TBEC
40.0000 mg | DELAYED_RELEASE_TABLET | Freq: Every day | ORAL | Status: DC
  Administered 2020-10-14 – 2020-10-16 (×3): 40 mg via ORAL
  Filled 2020-10-14 (×3): qty 1

## 2020-10-14 MED ORDER — SODIUM CHLORIDE 0.9 % IV SOLN
2.0000 g | Freq: Two times a day (BID) | INTRAVENOUS | Status: DC
  Filled 2020-10-14: qty 2

## 2020-10-14 MED ORDER — ALBUTEROL SULFATE HFA 108 (90 BASE) MCG/ACT IN AERS: 1.0000 | INHALATION_SPRAY | RESPIRATORY_TRACT | Status: DC | PRN

## 2020-10-14 MED ORDER — SODIUM CHLORIDE 0.9% FLUSH
3.0000 mL | Freq: Two times a day (BID) | INTRAVENOUS | Status: DC
  Administered 2020-10-14 – 2020-10-16 (×5): 3 mL via INTRAVENOUS

## 2020-10-14 MED ORDER — SODIUM CHLORIDE 0.9 % IV SOLN
2.0000 g | Freq: Three times a day (TID) | INTRAVENOUS | Status: DC
  Administered 2020-10-14 – 2020-10-16 (×6): 2 g via INTRAVENOUS
  Filled 2020-10-14 (×8): qty 2

## 2020-10-14 MED ORDER — DIPHENHYDRAMINE HCL 50 MG/ML IJ SOLN
25.0000 mg | Freq: Three times a day (TID) | INTRAMUSCULAR | Status: DC | PRN
  Administered 2020-10-14: 25 mg via INTRAVENOUS
  Filled 2020-10-14: qty 1

## 2020-10-14 MED ORDER — ONDANSETRON HCL 4 MG/2ML IJ SOLN: 4.0000 mg | Freq: Four times a day (QID) | INTRAMUSCULAR | Status: DC | PRN

## 2020-10-14 NOTE — Progress Notes (Signed)
PROGRESS NOTE    Kyle Torres  IRW:[id number] DOB: 04/14/1934 DOA: 10/13/2020 PCP: Rochel Brome, MD    Brief Narrative:  86 y.o. male with medical history significant for hypertension, OSA on BiPAP, history of CVA, BPH, asthma, and GERD, now presenting to the emergency department for evaluation of fevers, off-balance gait, and urinary incontinence.  Patient is accompanied by his stepdaughter who assists with the history.  He was reportedly in his usual state of health on 10/10/2020 but developed fevers and difficulty maintaining his balance while walking the following day.  Since then, he has continued to have fevers as high as 104 F at home despite using acetaminophen and ibuprofen. Outpt MRI done prior to this admit was reportedly found to be unremarkable  Assessment & Plan:   Principal Problem:   SIRS (systemic inflammatory response syndrome) (HCC) Active Problems:   Essential hypertension, malignant   History of CVA (cerebrovascular accident)   Lesion of urinary bladder   AAA (abdominal aortic aneurysm) ([place])  1. Sepsis with likely UTI present on admission - Patient presents with 3-4 days of fevers and off-balance gait, was given IM Rocephin and cefdinir Rx on 11/1 for possible UTI though he denies dysuria, flank pain, or suprapubic discomfort  - Pt without respiratory symptoms, COVID/influenza/RSV pcr negative, and no acute findings on CXR  - Abdominal exam benign and no source of symptoms noted on CT abd/pelvis  - Pt is without headache, neck stiffness, or confusion   - Macular rash noted at time of presentation. Noted to be pruritic this afternoon. Will give trial of benadryl  - Blood and urine cultures were collected in ED and he was started on broad-spectrum empiric antibiotics  - Continue cefepime, vancomycin, and Flagyl. Clinically improved this AM. Afebrile. Will narrow to cefepime monotherapy for now while awaiting final blood culture results  2. Hypertension  - BP  stable at present -continue lisinopril as tolerated  3. OSA  - Continue BiPAP qHS as tolerated  4. Urinary bladder lesion  - Enhancing nodule within urinary bladder noted on CT and most suggestive of primary urothelial carcinoma  - Recommend follow up with Urology as outpatient when discharged  5. AAA  - 3.0 cm AAA noted incidentally on CT in ED  - Recommend close outpatient follow up  6. History of CVA  - Patient had outpatient MRI brain on 10/12/20 that was reportedly negative for acute findings but revealed old CVA  - Continue ASA as tolerated  7. Asthma  - No cough or wheezing on admission  - Continue Asmanex and prn albuterol   DVT prophylaxis: Lovenox subq Code Status: Full Family Communication: Pt in room, family not at bedside  Status is: Observation  The patient will require care spanning > 2 midnights and should be moved to inpatient because: IV treatments appropriate due to intensity of illness or inability to take PO and Inpatient level of care appropriate due to severity of illness  Dispo: The patient is from: Home              Anticipated d/c is to: Home              Anticipated d/c date is: 2 days              Patient currently is not medically stable to d/c.       Consultants:     Procedures:     Antimicrobials: Anti-infectives (From admission, onward)   Start     Dose/Rate  Route Frequency Ordered Stop   10/14/20 1200  ceFEPIme (MAXIPIME) 2 g in sodium chloride 0.9 % 100 mL IVPB  Status:  Discontinued        2 g 200 mL/hr over 30 Minutes Intravenous Every 12 hours 10/14/20 0406 10/14/20 1112   10/14/20 1200  vancomycin (VANCOREADY) IVPB 750 mg/150 mL        750 mg 150 mL/hr over 60 Minutes Intravenous Every 12 hours 10/14/20 0406     10/14/20 1200  ceFEPIme (MAXIPIME) 2 g in sodium chloride 0.9 % 100 mL IVPB        2 g 200 mL/hr over 30 Minutes Intravenous Every 8 hours 10/14/20 1112     10/14/20 0400  metroNIDAZOLE (FLAGYL) IVPB 500 mg         500 mg 100 mL/hr over 60 Minutes Intravenous Every 8 hours 10/14/20 0352     10/13/20 2345  vancomycin (VANCOREADY) IVPB 1500 mg/300 mL        1,500 mg 150 mL/hr over 120 Minutes Intravenous  Once 10/13/20 2336 10/14/20 0313   10/13/20 2345  ceFEPIme (MAXIPIME) 2 g in sodium chloride 0.9 % 100 mL IVPB        2 g 200 mL/hr over 30 Minutes Intravenous  Once 10/13/20 2336 10/14/20 0031       Subjective: Reports feeling better today  Objective: Vitals:   10/14/20 0800 10/14/20 0834 10/14/20 1117 10/14/20 1634  BP: 135/69 (!) 142/68 (!) 143/74 (!) 154/74  Pulse: (!) 56 (!) 59 (!) 59 61  Resp: 19 18 12 18   Temp:  97.6 F (36.4 C) 97.9 F (36.6 C) 98 F (36.7 C)  TempSrc:  Oral Oral Oral  SpO2: 93% 97% 97% 96%  Weight:      Height:        Intake/Output Summary (Last 24 hours) at 10/14/2020 1658 Last data filed at 10/14/2020 1611 Gross per 24 hour  Intake 3485.12 ml  Output 300 ml  Net 3185.12 ml   Filed Weights   10/13/20 2201 10/13/20 2304  Weight: 93.4 kg 90.7 kg    Examination:  General exam: Appears calm and comfortable  Respiratory system: Clear to auscultation. Respiratory effort normal. Cardiovascular system: S1 & S2 heard, Regular Gastrointestinal system: Abdomen is nondistended, soft and nontender. No organomegaly or masses felt. Normal bowel sounds heard. Central nervous system: Alert and oriented. No focal neurological deficits. Extremities: Symmetric 5 x 5 power. Skin: Normal skin turgor, no ulcers Psychiatry: Judgement and insight appear normal. Mood & affect appropriate.   Data Reviewed: I have personally reviewed following labs and imaging studies  CBC: Recent Labs  Lab 10/12/20 1202 10/13/20 2255 10/14/20 0436  WBC 8.0 4.8 4.4  NEUTROABS 6.5 3.8 3.5  HGB 12.2* 12.4* 11.2*  HCT 36.3* 38.3* 33.9*  MCV 94 96.7 96.0  PLT 173 120* 867*   Basic Metabolic Panel: Recent Labs  Lab 10/12/20 1202 10/13/20 2255 10/14/20 0436  NA 140 134*  135  K 4.6 3.6 3.4*  CL 105 103 104  CO2 21 21* 23  GLUCOSE 113* 116* 107*  BUN 16 21 20   CREATININE 0.96 1.03 0.94  CALCIUM 8.7 8.1* 7.9*  MG  --   --  1.8   GFR: Estimated Creatinine Clearance: 65.1 mL/min (by C-G formula based on SCr of 0.94 mg/dL). Liver Function Tests: Recent Labs  Lab 10/12/20 1202 10/13/20 2255  AST 36 38  ALT 32 33  ALKPHOS 66 50  BILITOT 0.3 0.3  PROT  6.3 6.9  ALBUMIN 3.7 3.4*   No results for input(s): LIPASE, AMYLASE in the last 168 hours. No results for input(s): AMMONIA in the last 168 hours. Coagulation Profile: Recent Labs  Lab 10/13/20 2255  INR 1.2   Cardiac Enzymes: No results for input(s): CKTOTAL, CKMB, CKMBINDEX, TROPONINI in the last 168 hours. BNP (last 3 results) No results for input(s): PROBNP in the last 8760 hours. HbA1C: No results for input(s): HGBA1C in the last 72 hours. CBG: No results for input(s): GLUCAP in the last 168 hours. Lipid Profile: No results for input(s): CHOL, HDL, LDLCALC, TRIG, CHOLHDL, LDLDIRECT in the last 72 hours. Thyroid Function Tests: No results for input(s): TSH, T4TOTAL, FREET4, T3FREE, THYROIDAB in the last 72 hours. Anemia Panel: Recent Labs    10/12/20 1202  VITAMINB12 456   Sepsis Labs: Recent Labs  Lab 10/13/20 2255 10/14/20 0436  PROCALCITON  --  0.22  LATICACIDVEN 1.2  --     Recent Results (from the past 240 hour(s))  Urine Culture     Status: None   Collection Time: 10/12/20 11:55 AM   Specimen: Urine   UR  Result Value Ref Range Status   Urine Culture, Routine Final report  Final   Organism ID, Bacteria Comment  Final    Comment: Mixed urogenital flora 25,000-50,000 colony forming units per mL   Resp Panel by RT PCR (RSV, Flu A&B, Covid) - Nasopharyngeal Swab     Status: None   Collection Time: 10/13/20 10:55 PM   Specimen: Nasopharyngeal Swab  Result Value Ref Range Status   SARS Coronavirus 2 by RT PCR NEGATIVE NEGATIVE Final    Comment: (NOTE) SARS-CoV-2  target nucleic acids are NOT DETECTED.  The SARS-CoV-2 RNA is generally detectable in upper respiratoy specimens during the acute phase of infection. The lowest concentration of SARS-CoV-2 viral copies this assay can detect is 131 copies/mL. A negative result does not preclude SARS-Cov-2 infection and should not be used as the sole basis for treatment or other patient management decisions. A negative result may occur with  improper specimen collection/handling, submission of specimen other than nasopharyngeal swab, presence of viral mutation(s) within the areas targeted by this assay, and inadequate number of viral copies (<131 copies/mL). A negative result must be combined with clinical observations, patient history, and epidemiological information. The expected result is Negative.  Fact Sheet for Patients:  PinkCheek.be  Fact Sheet for Healthcare Providers:  GravelBags.it  This test is no t yet approved or cleared by the [place] FDA and  has been authorized for detection and/or diagnosis of SARS-CoV-2 by FDA under an Emergency Use Authorization (EUA). This EUA will remain  in effect (meaning this test can be used) for the duration of the COVID-19 declaration under Section 564(b)(1) of the Act, 21 U.S.C. section 360bbb-3(b)(1), unless the authorization is terminated or revoked sooner.     Influenza A by PCR NEGATIVE NEGATIVE Final   Influenza B by PCR NEGATIVE NEGATIVE Final    Comment: (NOTE) The Xpert Xpress SARS-CoV-2/FLU/RSV assay is intended as an aid in  the diagnosis of influenza from Nasopharyngeal swab specimens and  should not be used as a sole basis for treatment. Nasal washings and  aspirates are unacceptable for Xpert Xpress SARS-CoV-2/FLU/RSV  testing.  Fact Sheet for Patients: PinkCheek.be  Fact Sheet for Healthcare  Providers: GravelBags.it  This test is not yet approved or cleared by the [place] FDA and  has been authorized for detection and/or diagnosis of SARS-CoV-2  by  FDA under an Emergency Use Authorization (EUA). This EUA will remain  in effect (meaning this test can be used) for the duration of the  Covid-19 declaration under Section 564(b)(1) of the Act, 21  U.S.C. section 360bbb-3(b)(1), unless the authorization is  terminated or revoked.    Respiratory Syncytial Virus by PCR NEGATIVE NEGATIVE Final    Comment: (NOTE) Fact Sheet for Patients: PinkCheek.be  Fact Sheet for Healthcare Providers: GravelBags.it  This test is not yet approved or cleared by the [place] FDA and  has been authorized for detection and/or diagnosis of SARS-CoV-2 by  FDA under an Emergency Use Authorization (EUA). This EUA will remain  in effect (meaning this test can be used) for the duration of the  COVID-19 declaration under Section 564(b)(1) of the Act, 21 U.S.C.  section 360bbb-3(b)(1), unless the authorization is terminated or  revoked. Performed at [facility], [place] [address], [place], [place] [address]      Radiology Studies: CT ABDOMEN PELVIS W CONTRAST  Result Date: 10/14/2020 CLINICAL DATA:  Hematuria, gait abnormality, urinary and fecal incontinence EXAM: CT ABDOMEN AND PELVIS WITH CONTRAST TECHNIQUE: Multidetector CT imaging of the abdomen and pelvis was performed using the standard protocol following bolus administration of intravenous contrast. CONTRAST:  138mL OMNIPAQUE IOHEXOL 300 MG/ML  SOLN COMPARISON:  04/01/2009 FINDINGS: Lower chest: Elevation of the left hemidiaphragm again noted. Progressive bibasilar pulmonary fibrotic change. Extensive coronary artery calcification. Global cardiac size within normal limits. The visualized pulmonary arterial tree is dilated centrally in  keeping with pulmonary arterial hypertension. Stable scattered pericardial calcification Hepatobiliary: Cholecystectomy has been performed. Liver unremarkable. No intra or extrahepatic biliary ductal dilation. Pancreas: Unremarkable Spleen: Unremarkable Adrenals/Urinary Tract: The adrenal glands are unremarkable. The kidneys are normal in size and position. Multiple parapelvic cysts are seen bilaterally. No hydronephrosis. No intrarenal or ureteral calculi are identified. A 13 mm enhancing mural nodule is seen within the left anterior bladder wall most in keeping with a primary urothelial carcinoma. The prostate gland is moderately enlarged and indents the base of the bladder. The bladder is not distended though the bladder wall appears mildly thickened suggesting changes of chronic bladder outlet obstruction. Stomach/Bowel: There is moderate stool seen throughout the colon and rectal vault. No evidence of obstruction. The stomach, small bowel, and large bowel are otherwise unremarkable. Appendix normal. No free intraperitoneal gas or fluid. Vascular/Lymphatic: Moderate aortoiliac atherosclerotic calcification. 3.0 cm fusiform infrarenal abdominal aortic aneurysm is present. Several shotty lymph nodes are seen within the inguinal lymph node groups bilaterally with at least 1 lymph node within the left inguinal region demonstrating a prominent rounded configuration. No frankly pathologic adenopathy within the abdomen and pelvis by size criteria. Reproductive: The prostate gland, as noted above, is moderately enlarged, particularly centrally. Seminal vesicles are unremarkable. Other: None significant Musculoskeletal: No acute bone abnormality. L4-5 posterior lumbar fusion with resection of the L4 spinous process is noted. Degenerative changes are seen throughout the lumbar spine. No lytic or blastic bone lesions. IMPRESSION: 13 mm enhancing nodule within the bladder anteriorly most in keeping with a primary urothelial  carcinoma. Cystoscopic correlation is recommended. Shotty left inguinal adenopathy is not frankly pathologic, however, PET CT examination may be helpful to definitively exclude nodal involvement. Alternatively, close attention on subsequent examination would be helpful to ensure stability or resolution Moderate prostatic enlargement, particularly centrally, with secondary changes suggesting at least mild chronic bladder outlet obstruction. 3.0 cm fusiform infrarenal abdominal aortic aneurysm Recommend follow-up ultrasound every 3  years. This recommendation follows ACR consensus guidelines: White Paper of the ACR Incidental Findings Committee II on Vascular Findings. J Am Coll Radiol 2013; 10:789-794. Extensive coronary artery calcification Aortic aneurysm NOS (ICD10-I71.9). Aortic Atherosclerosis (ICD10-I70.0). Electronically Signed   By: Fidela Salisbury MD   On: 10/14/2020 02:45   CT L-SPINE NO CHARGE  Result Date: 10/14/2020 CLINICAL DATA:  Gait imbalance, fecal and urinary incontinence EXAM: CT LUMBAR SPINE WITHOUT CONTRAST TECHNIQUE: Multidetector CT imaging of the lumbar spine was performed without intravenous contrast administration. Multiplanar CT image reconstructions were also generated. COMPARISON:  None. FINDINGS: Segmentation: 5 non rib bearing segments of the lumbar spine. Alignment: Posterior lumbar fusion with instrumentation of L4-5 is been performed with bilateral pedicle screws and posterior bars. There is grade 1 anterolisthesis of L4 upon L5. Partial resection of the spinous process of L4 has been performed. Minimal retrolisthesis of L1 upon L2 and L2 upon L3 is likely degenerative in nature Vertebrae: No acute fracture or focal pathologic process identified. Paraspinal and other soft tissues: Negative. Disc levels: Review of the sagittal reformats demonstrates normal lumbar lordosis. There is diffuse intervertebral disc space narrowing and endplate remodeling throughout the lumbar spine, most  severe at L3-4 and L4-5 and L5-S1 with vacuum disc phenomena noted at these levels. Vertebral body height has been preserved. Review of the axial images demonstrates: T12-L1: No canal stenosis or neural foraminal narrowing. No significant facet arthrosis. L1-2: Mild facet arthrosis. Posterior disc osteophyte complex results in mild bilateral neural foraminal narrowing. L2-3: Mild bilateral facet arthrosis. No significant neural foraminal narrowing. No significant canal stenosis. L3-4: Marked bilateral facet arthrosis. No significant canal stenosis. At least moderate central canal stenosis, not well evaluated on this non myelographic study. L4-5: No significant neural foraminal narrowing.  No canal stenosis. L5-S1: Mild to moderate bilateral neural foraminal narrowing secondary to posterior disc osteophyte complex. No canal stenosis. IMPRESSION: No acute fracture or traumatic listhesis. Status post L4-5 posterior lumbar fusion with instrumentation with partial resection of the L4 spinous process. Moderate central canal stenosis at L3-4, not well characterized on this examination. CT myelography may be helpful to assess the degree of central canal stenosis. Electronically Signed   By: Fidela Salisbury MD   On: 10/14/2020 02:55   DG Chest Port 1 View  Result Date: 10/13/2020 CLINICAL DATA:  Sepsis, weakness EXAM: PORTABLE CHEST 1 VIEW COMPARISON:  10/11/2020 FINDINGS: Stable elevation of the left hemidiaphragm. Lungs are clear. No pneumothorax or pleural effusion. Mild cardiomegaly is stable since prior examination. Pulmonary vascularity is normal when accounting for altered patient positioning. No acute bone abnormality. IMPRESSION: No active disease.  Stable elevation of the left hemidiaphragm. Electronically Signed   By: Fidela Salisbury MD   On: 10/13/2020 23:15    Scheduled Meds:  aspirin EC  81 mg Oral Daily   enoxaparin (LOVENOX) injection  40 mg Subcutaneous Q24H   finasteride  5 mg Oral Daily    fluticasone  2 puff Inhalation BID   lisinopril  20 mg Oral Daily   pantoprazole  40 mg Oral Daily   sodium chloride flush  3 mL Intravenous Q12H   terazosin  5 mg Oral BID   Continuous Infusions:  ceFEPime (MAXIPIME) IV 2 g (10/14/20 1147)   metronidazole 500 mg (10/14/20 1246)   vancomycin 750 mg (10/14/20 1415)     LOS: 0 days   Marylu Lund, MD [facility] Pager On Amion  If 7PM-7AM, please contact night-coverage 10/14/2020, 4:58 PM

## 2020-10-14 NOTE — Progress Notes (Signed)
PHARMACY NOTE:  ANTIMICROBIAL RENAL DOSAGE ADJUSTMENT  Current antimicrobial regimen includes a mismatch between antimicrobial dosage and estimated renal function.  As per policy approved by the Pharmacy & Therapeutics and Medical Executive Committees, the antimicrobial dosage will be adjusted accordingly.  Current antimicrobial dosage:  Cefepime 2 g iv q 12 hours  Indication: SIRS  Renal Function: SCr improved  Estimated Creatinine Clearance: 65.1 mL/min (by C-G formula based on SCr of 0.94 mg/dL). []      On intermittent HD, scheduled: []      On CRRT    Antimicrobial dosage has been changed to:  Cefepime 2 g iv q 8 hours  Additional comments:   Thank you for allowing pharmacy to be a part of this patient's care.  Napoleon Form, [facility] 10/14/2020 11:14 AM

## 2020-10-14 NOTE — Progress Notes (Signed)
Pt has small red areas, petechiae like rash noted on his head, back, chest, stomach, and groin. Pt denies any itchiness.  MD notified. Jerene Pitch

## 2020-10-14 NOTE — Progress Notes (Signed)
Pharmacy Antibiotic Note  Kyle Torres is a 86 y.o. male admitted on 10/13/2020 with with a history of OSA on CPAP, CVA lumbar radiculopathy s/p L4-5 laminectomy who presents to ED with fever,  fatigue, generalized weakness, dizziness, one episode of bowel incontinence, and a couple of episodes of urinary incontinence. Pharmacy has been consulted for vancomycin and cefepime dosing.  Pt received  Cefepime 2gm x 1 and vanc 1500mg  x 1 in ED  Plan: Cefepime 2gm IV q12h Vancomycin 750mg  IV q12h Follow renal function, cultures and clinical course  Height: 5\' 9"  (175.3 cm) Weight: 90.7 kg (200 lb) IBW/kg (Calculated) : 70.7  Temp (24hrs), Avg:103.3 F (39.6 C), Min:103.3 F (39.6 C), Max:103.3 F (39.6 C)  Recent Labs  Lab 10/13/20 2255  WBC 4.8  CREATININE 1.03  LATICACIDVEN 1.2    Estimated Creatinine Clearance: 59.4 mL/min (by C-G formula based on SCr of 1.03 mg/dL).    No Known Allergies  Antimicrobials this admission: 11/3 vanc >> 11/3 cefepime >> 11/3 flagyl >> Dose adjustments this admission:   Microbiology results: 11/2 BCx:  11/2 UCx:   Thank you for allowing pharmacy to be a part of this patient's care.  Dolly Rias RPh 10/14/2020, 4:04 AM

## 2020-10-14 NOTE — H&P (Signed)
History and Physical    Kyle Torres TXM:[id number] DOB: 10-20-1934 DOA: 10/13/2020  PCP: Rochel Brome, MD   Patient coming from: Home   Chief Complaint: Fevers, off-balance gait   HPI: Kyle Torres is a 86 y.o. male with medical history significant for hypertension, OSA on BiPAP, history of CVA, BPH, asthma, and GERD, now presenting to the emergency department for evaluation of fevers, off-balance gait, and urinary incontinence.  Patient is accompanied by his stepdaughter who assists with the history.  He was reportedly in his usual state of health on 10/10/2020 but developed fevers and difficulty maintaining his balance while walking the following day.  Since then, he has continued to have fevers as high as 104 F at home despite using acetaminophen and ibuprofen.  He continues to have difficulty with his gait.  He denies any cough, rhinorrhea, sore throat, shortness of breath, abdominal pain, nausea, vomiting, diarrhea, dysuria, flank pain, or suprapubic discomfort.  Patient also denies any headache, neck stiffness, or focal numbness or weakness.  He is not aware of any tick bites.  Denies any back pain.  Patient was seen at [facility] ED on 10/11/2020 where he reportedly had negative head CT, saw his PCP the following day and underwent outpatient MRI which was reportedly notable for an old CVA only.  He was given IM Rocephin and prescription for cefdinir on 10/12/2020.  ED Course: Upon arrival to the ED, patient is found to be febrile to 39.6 C, saturating low 90s on room air, slightly tachycardic, and with stable blood pressure.  EKG features sinus tachycardia with rate 103.  Chest x-rays negative for acute findings.  CT L-spine negative for fracture or traumatic listhesis.  CT abdomen/pelvis notable for enhancing nodule within the urinary bladder suspicious for primary urothelial carcinoma, prostate enlargement with chronic bladder outlet obstruction, and 3 cm fusiform infrarenal  AAA.  Chemistry panel unremarkable and CBC notable for a mild thrombocytopenia.  Lactic acid is normal.  COVID-19 PCR is negative.  Blood and urine cultures were collected in the emergency department and the patient was treated with cefepime, vancomycin, Advil, and IV fluids.  Review of Systems:  All other systems reviewed and apart from HPI, are negative.  Past Medical History:  Diagnosis Date  . Arthritis   . Asthma   . BPH (benign prostatic hypertrophy)   . Essential hypertension, malignant 04/22/2020  . GERD (gastroesophageal reflux disease)   . Hiatal hernia neck pain and back pain   . Reflux   . Sleep apnea study was 52yrs ago at [facility]   . Stroke ([place]) 02/2018   rt hand has difficulty with fine movements.    Past Surgical History:  Procedure Laterality Date  . ANTERIOR CERVICAL DECOMP/DISCECTOMY FUSION  10/19/2011   Procedure: ANTERIOR CERVICAL DECOMPRESSION/DISCECTOMY FUSION 2 LEVELS;  Surgeon: Ophelia Charter;  Location: [facility];  Service: Neurosurgery;  Laterality: N/A;  CERVICAL FIVE-SIX, SIX-SEVEN ANTERIOR CERVICAL DECOMPRESSION WITH FUSION AND  INTERBODY PROTHESIS PLATING  . CHOLECYSTECTOMY  2010  . EYE SURGERY    . RETINAL LASER PROCEDURE  1999   Torn retina, laser repair  . SHOULDER SURGERY  2000    Social History:   reports that he quit smoking about 44 years ago. He quit smokeless tobacco use about 37 years ago. He reports that he does not drink alcohol and does not use drugs.  No Known Allergies  Family History  Problem Relation Age of Onset  . Cirrhosis Mother   .  Lung cancer Father   . Cancer Sister   . Coronary artery disease Neg Hx      Prior to Admission medications   Medication Sig Start Date End Date Taking? Authorizing Provider  acetaminophen (TYLENOL) 500 MG tablet Take 1,000 mg by mouth every 6 (six) hours as needed for mild pain.   Yes [provider]  albuterol (PROVENTIL HFA;VENTOLIN HFA) 108 (90 BASE) MCG/ACT inhaler  Inhale 1 puff into the lungs every 6 (six) hours as needed. FOR SHORTNESS OF BREATH   Yes [provider]  aspirin EC 81 MG tablet Take 1 tablet (81 mg total) by mouth daily. 03/25/20  Yes Cox, Kirsten, MD  cefdinir (OMNICEF) 300 MG capsule Take 1 capsule (300 mg total) by mouth 2 (two) times daily. 10/12/20  Yes Cox, Kirsten, MD  finasteride (PROSCAR) 5 MG tablet Take 5 mg by mouth daily.    Yes [provider]  ibuprofen (ADVIL) 200 MG tablet Take 200 mg by mouth every 6 (six) hours as needed for moderate pain.   Yes [provider]  lisinopril (ZESTRIL) 20 MG tablet TAKE ONE (1) TABLET BY MOUTH ONCE DAILY Patient taking differently: Take 20 mg by mouth daily.  08/03/20  Yes Marge Duncans, PA-C  mometasone [facility]) 220 MCG/INH inhaler Inhale 1 puff into the lungs 2 (two) times daily.    Yes [provider]  Multiple Vitamin (MULTIVITAMIN) tablet Take 1 tablet by mouth daily.    Yes [provider]  omeprazole (PRILOSEC) 20 MG capsule Take 20 mg by mouth 2 (two) times daily.    Yes [provider]  terazosin (HYTRIN) 5 MG capsule Take 5 mg by mouth 2 (two) times daily.     Yes [provider]    Physical Exam: Vitals:   10/14/20 0030 10/14/20 0107 10/14/20 0130 10/14/20 0330  BP: (!) 148/70 133/70 127/65 133/60  Pulse: (!) 102 98 87 73  Resp: (!) 31 (!) 29 18 (!) 23  Temp:      TempSrc:      SpO2: 93% 93% 96% 96%  Weight:      Height:        Constitutional: NAD, calm  Eyes: PERTLA, lids and conjunctivae normal ENMT: Mucous membranes are moist. Posterior pharynx clear of any exudate or lesions.   Neck: normal, supple, no masses, no thyromegaly Respiratory: no wheezing, no crackles. No accessory muscle use.  Cardiovascular: S1 & S2 heard, regular rate and rhythm. No extremity edema.   Abdomen: No distension, no tenderness, soft. Bowel sounds active.  Musculoskeletal: no clubbing / cyanosis. No joint deformity upper and lower  extremities.   Skin: erythematous macules involving buttock and proximal LEs. Warm, dry, well-perfused. Neurologic: No gross facial asymmetry, no dysarthria or aphasia. Gross hearing deficit. Sensation to light touch intact. Strength 5/5 in all 4 limbs.  Psychiatric: Alert and oriented to person, place, and situation. Pleasant and cooperative.    Labs and Imaging on Admission: I have personally reviewed following labs and imaging studies  CBC: Recent Labs  Lab 10/13/20 2255  WBC 4.8  NEUTROABS 3.8  HGB 12.4*  HCT 38.3*  MCV 96.7  PLT 542*   Basic Metabolic Panel: Recent Labs  Lab 10/13/20 2255  NA 134*  K 3.6  CL 103  CO2 21*  GLUCOSE 116*  BUN 21  CREATININE 1.03  CALCIUM 8.1*   GFR: Estimated Creatinine Clearance: 59.4 mL/min (by C-G formula based on SCr of 1.03 mg/dL). Liver Function Tests: Recent  Labs  Lab 10/13/20 2255  AST 38  ALT 33  ALKPHOS 50  BILITOT 0.3  PROT 6.9  ALBUMIN 3.4*   No results for input(s): LIPASE, AMYLASE in the last 168 hours. No results for input(s): AMMONIA in the last 168 hours. Coagulation Profile: Recent Labs  Lab 10/13/20 2255  INR 1.2   Cardiac Enzymes: No results for input(s): CKTOTAL, CKMB, CKMBINDEX, TROPONINI in the last 168 hours. BNP (last 3 results) No results for input(s): PROBNP in the last 8760 hours. HbA1C: No results for input(s): HGBA1C in the last 72 hours. CBG: No results for input(s): GLUCAP in the last 168 hours. Lipid Profile: No results for input(s): CHOL, HDL, LDLCALC, TRIG, CHOLHDL, LDLDIRECT in the last 72 hours. Thyroid Function Tests: No results for input(s): TSH, T4TOTAL, FREET4, T3FREE, THYROIDAB in the last 72 hours. Anemia Panel: No results for input(s): VITAMINB12, FOLATE, FERRITIN, TIBC, IRON, RETICCTPCT in the last 72 hours. Urine analysis:    Component Value Date/Time   COLORURINE AMBER (A) 10/13/2020 2255   APPEARANCEUR CLOUDY (A) 10/13/2020 2255   LABSPEC 1.024 10/13/2020 2255    PHURINE 5.0 10/13/2020 2255   GLUCOSEU NEGATIVE 10/13/2020 2255   HGBUR LARGE (A) 10/13/2020 2255   BILIRUBINUR NEGATIVE 10/13/2020 2255   BILIRUBINUR neg 10/12/2020 1132   KETONESUR 20 (A) 10/13/2020 2255   PROTEINUR 100 (A) 10/13/2020 2255   UROBILINOGEN 1.0 10/12/2020 1132   NITRITE NEGATIVE 10/13/2020 2255   LEUKOCYTESUR NEGATIVE 10/13/2020 2255   Sepsis Labs: @LABRCNTIP (procalcitonin:4,lacticidven:4) ) Recent Results (from the past 240 hour(s))  Resp Panel by RT PCR (RSV, Flu A&B, Covid) - Nasopharyngeal Swab     Status: None   Collection Time: 10/13/20 10:55 PM   Specimen: Nasopharyngeal Swab  Result Value Ref Range Status   SARS Coronavirus 2 by RT PCR NEGATIVE NEGATIVE Final    Comment: (NOTE) SARS-CoV-2 target nucleic acids are NOT DETECTED.  The SARS-CoV-2 RNA is generally detectable in upper respiratoy specimens during the acute phase of infection. The lowest concentration of SARS-CoV-2 viral copies this assay can detect is 131 copies/mL. A negative result does not preclude SARS-Cov-2 infection and should not be used as the sole basis for treatment or other patient management decisions. A negative result may occur with  improper specimen collection/handling, submission of specimen other than nasopharyngeal swab, presence of viral mutation(s) within the areas targeted by this assay, and inadequate number of viral copies (<131 copies/mL). A negative result must be combined with clinical observations, patient history, and epidemiological information. The expected result is Negative.  Fact Sheet for Patients:  PinkCheek.be  Fact Sheet for Healthcare Providers:  GravelBags.it  This test is no t yet approved or cleared by the [place] FDA and  has been authorized for detection and/or diagnosis of SARS-CoV-2 by FDA under an Emergency Use Authorization (EUA). This EUA will remain  in effect (meaning this  test can be used) for the duration of the COVID-19 declaration under Section 564(b)(1) of the Act, 21 U.S.C. section 360bbb-3(b)(1), unless the authorization is terminated or revoked sooner.     Influenza A by PCR NEGATIVE NEGATIVE Final   Influenza B by PCR NEGATIVE NEGATIVE Final    Comment: (NOTE) The Xpert Xpress SARS-CoV-2/FLU/RSV assay is intended as an aid in  the diagnosis of influenza from Nasopharyngeal swab specimens and  should not be used as a sole basis for treatment. Nasal washings and  aspirates are unacceptable for Xpert Xpress SARS-CoV-2/FLU/RSV  testing.  Fact Sheet for Patients: PinkCheek.be  Fact Sheet for Healthcare Providers: GravelBags.it  This test is not yet approved or cleared by the [place] FDA and  has been authorized for detection and/or diagnosis of SARS-CoV-2 by  FDA under an Emergency Use Authorization (EUA). This EUA will remain  in effect (meaning this test can be used) for the duration of the  Covid-19 declaration under Section 564(b)(1) of the Act, 21  U.S.C. section 360bbb-3(b)(1), unless the authorization is  terminated or revoked.    Respiratory Syncytial Virus by PCR NEGATIVE NEGATIVE Final    Comment: (NOTE) Fact Sheet for Patients: PinkCheek.be  Fact Sheet for Healthcare Providers: GravelBags.it  This test is not yet approved or cleared by the [place] FDA and  has been authorized for detection and/or diagnosis of SARS-CoV-2 by  FDA under an Emergency Use Authorization (EUA). This EUA will remain  in effect (meaning this test can be used) for the duration of the  COVID-19 declaration under Section 564(b)(1) of the Act, 21 U.S.C.  section 360bbb-3(b)(1), unless the authorization is terminated or  revoked. Performed at [facility], [place] [address], [place], [place] [address]       Radiological Exams on Admission: CT ABDOMEN PELVIS W CONTRAST  Result Date: 10/14/2020 CLINICAL DATA:  Hematuria, gait abnormality, urinary and fecal incontinence EXAM: CT ABDOMEN AND PELVIS WITH CONTRAST TECHNIQUE: Multidetector CT imaging of the abdomen and pelvis was performed using the standard protocol following bolus administration of intravenous contrast. CONTRAST:  120mL OMNIPAQUE IOHEXOL 300 MG/ML  SOLN COMPARISON:  04/01/2009 FINDINGS: Lower chest: Elevation of the left hemidiaphragm again noted. Progressive bibasilar pulmonary fibrotic change. Extensive coronary artery calcification. Global cardiac size within normal limits. The visualized pulmonary arterial tree is dilated centrally in keeping with pulmonary arterial hypertension. Stable scattered pericardial calcification Hepatobiliary: Cholecystectomy has been performed. Liver unremarkable. No intra or extrahepatic biliary ductal dilation. Pancreas: Unremarkable Spleen: Unremarkable Adrenals/Urinary Tract: The adrenal glands are unremarkable. The kidneys are normal in size and position. Multiple parapelvic cysts are seen bilaterally. No hydronephrosis. No intrarenal or ureteral calculi are identified. A 13 mm enhancing mural nodule is seen within the left anterior bladder wall most in keeping with a primary urothelial carcinoma. The prostate gland is moderately enlarged and indents the base of the bladder. The bladder is not distended though the bladder wall appears mildly thickened suggesting changes of chronic bladder outlet obstruction. Stomach/Bowel: There is moderate stool seen throughout the colon and rectal vault. No evidence of obstruction. The stomach, small bowel, and large bowel are otherwise unremarkable. Appendix normal. No free intraperitoneal gas or fluid. Vascular/Lymphatic: Moderate aortoiliac atherosclerotic calcification. 3.0 cm fusiform infrarenal abdominal aortic aneurysm is present. Several shotty lymph nodes are seen  within the inguinal lymph node groups bilaterally with at least 1 lymph node within the left inguinal region demonstrating a prominent rounded configuration. No frankly pathologic adenopathy within the abdomen and pelvis by size criteria. Reproductive: The prostate gland, as noted above, is moderately enlarged, particularly centrally. Seminal vesicles are unremarkable. Other: None significant Musculoskeletal: No acute bone abnormality. L4-5 posterior lumbar fusion with resection of the L4 spinous process is noted. Degenerative changes are seen throughout the lumbar spine. No lytic or blastic bone lesions. IMPRESSION: 13 mm enhancing nodule within the bladder anteriorly most in keeping with a primary urothelial carcinoma. Cystoscopic correlation is recommended. Shotty left inguinal adenopathy is not frankly pathologic, however, PET CT examination may be helpful to definitively exclude nodal involvement. Alternatively, close attention on subsequent examination would be helpful  to ensure stability or resolution Moderate prostatic enlargement, particularly centrally, with secondary changes suggesting at least mild chronic bladder outlet obstruction. 3.0 cm fusiform infrarenal abdominal aortic aneurysm Recommend follow-up ultrasound every 3 years. This recommendation follows ACR consensus guidelines: White Paper of the ACR Incidental Findings Committee II on Vascular Findings. J Am Coll Radiol 2013; 10:789-794. Extensive coronary artery calcification Aortic aneurysm NOS (ICD10-I71.9). Aortic Atherosclerosis (ICD10-I70.0). Electronically Signed   By: Fidela Salisbury MD   On: 10/14/2020 02:45   CT L-SPINE NO CHARGE  Result Date: 10/14/2020 CLINICAL DATA:  Gait imbalance, fecal and urinary incontinence EXAM: CT LUMBAR SPINE WITHOUT CONTRAST TECHNIQUE: Multidetector CT imaging of the lumbar spine was performed without intravenous contrast administration. Multiplanar CT image reconstructions were also generated.  COMPARISON:  None. FINDINGS: Segmentation: 5 non rib bearing segments of the lumbar spine. Alignment: Posterior lumbar fusion with instrumentation of L4-5 is been performed with bilateral pedicle screws and posterior bars. There is grade 1 anterolisthesis of L4 upon L5. Partial resection of the spinous process of L4 has been performed. Minimal retrolisthesis of L1 upon L2 and L2 upon L3 is likely degenerative in nature Vertebrae: No acute fracture or focal pathologic process identified. Paraspinal and other soft tissues: Negative. Disc levels: Review of the sagittal reformats demonstrates normal lumbar lordosis. There is diffuse intervertebral disc space narrowing and endplate remodeling throughout the lumbar spine, most severe at L3-4 and L4-5 and L5-S1 with vacuum disc phenomena noted at these levels. Vertebral body height has been preserved. Review of the axial images demonstrates: T12-L1: No canal stenosis or neural foraminal narrowing. No significant facet arthrosis. L1-2: Mild facet arthrosis. Posterior disc osteophyte complex results in mild bilateral neural foraminal narrowing. L2-3: Mild bilateral facet arthrosis. No significant neural foraminal narrowing. No significant canal stenosis. L3-4: Marked bilateral facet arthrosis. No significant canal stenosis. At least moderate central canal stenosis, not well evaluated on this non myelographic study. L4-5: No significant neural foraminal narrowing.  No canal stenosis. L5-S1: Mild to moderate bilateral neural foraminal narrowing secondary to posterior disc osteophyte complex. No canal stenosis. IMPRESSION: No acute fracture or traumatic listhesis. Status post L4-5 posterior lumbar fusion with instrumentation with partial resection of the L4 spinous process. Moderate central canal stenosis at L3-4, not well characterized on this examination. CT myelography may be helpful to assess the degree of central canal stenosis. Electronically Signed   By: Fidela Salisbury  MD   On: 10/14/2020 02:55   DG Chest Port 1 View  Result Date: 10/13/2020 CLINICAL DATA:  Sepsis, weakness EXAM: PORTABLE CHEST 1 VIEW COMPARISON:  10/11/2020 FINDINGS: Stable elevation of the left hemidiaphragm. Lungs are clear. No pneumothorax or pleural effusion. Mild cardiomegaly is stable since prior examination. Pulmonary vascularity is normal when accounting for altered patient positioning. No acute bone abnormality. IMPRESSION: No active disease.  Stable elevation of the left hemidiaphragm. Electronically Signed   By: Fidela Salisbury MD   On: 10/13/2020 23:15    EKG: Independently reviewed. Sinus tachycardia, rate 103.   Assessment/Plan   1. SIRS  - Patient presents with 3-4 days of fevers and off-balance gait, was given IM Rocephin and cefdinir Rx on 11/1 for possible UTI though he denies dysuria, flank pain, or suprapubic discomfort  - He denies any respiratory sxs, COVID/influenza/RSV pcr negative, and no acute findings on CXR  - Abdominal exam benign and no source of symptoms noted on CT abd/pelvis  - No headache, neck stiffness, or confusion   - Macular rash noted involving buttock  and proximal LEs but no apparent cellulitis or wounds  - Blood and urine cultures were collected in ED and he was started on broad-spectrum empiric antibiotics  - Continue cefepime, vancomycin, and Flagyl, check/trend procalcitonin, follow cultures and clinical course   2. Hypertension  - BP at goal, continue lisinopril as tolerated    3. OSA  - Continue BiPAP qHS   4. Urinary bladder lesion  - Enhancing nodule within urinary bladder noted on CT and most suggestive of primary urothelial carcinoma  - Urology evaluation recommended, can likely be done outpatient    5. AAA  - 3.0 cm AAA noted incidentally on CT in ED  - Outpatient follow-up recommended    6. History of CVA  - Patient had outpatient MRI brain on 10/12/20 that was reportedly negative for acute findings but revealed old CVA  -  Continue ASA   7. Asthma  - No cough or wheezing on admission  - Continue Asmanex and prn albuterol    DVT prophylaxis: Lovenox  Code Status: Full  Family Communication: Stepdaughter updated at bedside Disposition Plan:  Patient is from: Home  Anticipated d/c is to: TBD Anticipated d/c date is: Possibly as early as 10/15/20 Patient currently: Having high fevers, cultures pending Consults called: None  Admission status: Observation     Vianne Bulls, MD [facility]  10/14/2020, 4:00 AM

## 2020-10-14 NOTE — Plan of Care (Signed)

## 2020-10-14 NOTE — ED Notes (Signed)
Report given to [place], Therapist, sports.

## 2020-10-15 LAB — CBC WITH DIFFERENTIAL/PLATELET
Abs Immature Granulocytes: 0.05 10*3/uL (ref 0.00–0.07)
Basophils Absolute: 0 10*3/uL (ref 0.0–0.1)
Basophils Relative: 0 %
Eosinophils Absolute: 0.2 10*3/uL (ref 0.0–0.5)
Eosinophils Relative: 4 %
HCT: 33.1 % — ABNORMAL LOW (ref 39.0–52.0)
Hemoglobin: 10.8 g/dL — ABNORMAL LOW (ref 13.0–17.0)
Immature Granulocytes: 1 %
Lymphocytes Relative: 23 %
Lymphs Abs: 1.2 10*3/uL (ref 0.7–4.0)
MCH: 31.4 pg (ref 26.0–34.0)
MCHC: 32.6 g/dL (ref 30.0–36.0)
MCV: 96.2 fL (ref 80.0–100.0)
Monocytes Absolute: 0.4 10*3/uL (ref 0.1–1.0)
Monocytes Relative: 7 %
Neutro Abs: 3.4 10*3/uL (ref 1.7–7.7)
Neutrophils Relative %: 65 %
Platelets: 103 10*3/uL — ABNORMAL LOW (ref 150–400)
RBC: 3.44 MIL/uL — ABNORMAL LOW (ref 4.22–5.81)
RDW: 13.7 % (ref 11.5–15.5)
WBC: 5.2 10*3/uL (ref 4.0–10.5)
nRBC: 0 % (ref 0.0–0.2)

## 2020-10-15 LAB — BASIC METABOLIC PANEL
Anion gap: 10 (ref 5–15)
BUN: 19 mg/dL (ref 8–23)
CO2: 23 mmol/L (ref 22–32)
Calcium: 8.1 mg/dL — ABNORMAL LOW (ref 8.9–10.3)
Chloride: 101 mmol/L (ref 98–111)
Creatinine, Ser: 0.87 mg/dL (ref 0.61–1.24)
GFR, Estimated: >60 mL/min (ref >60)
Glucose, Bld: 92 mg/dL (ref 70–99)
Potassium: 4.2 mmol/L (ref 3.5–5.1)
Sodium: 134 mmol/L — ABNORMAL LOW (ref 135–145)

## 2020-10-15 LAB — SEDIMENTATION RATE: Sed Rate: 18 mm/hr — ABNORMAL HIGH (ref 0–16)

## 2020-10-15 LAB — C-REACTIVE PROTEIN: CRP: 12.9 mg/dL — ABNORMAL HIGH (ref <1.0)

## 2020-10-15 MED ORDER — FAMOTIDINE IN NACL 20-0.9 MG/50ML-% IV SOLN
20.0000 mg | Freq: Once | INTRAVENOUS | Status: AC
  Administered 2020-10-15: 20 mg via INTRAVENOUS
  Filled 2020-10-15: qty 50

## 2020-10-15 MED ORDER — METHYLPREDNISOLONE SODIUM SUCC 125 MG IJ SOLR
40.0000 mg | Freq: Once | INTRAMUSCULAR | Status: AC
  Administered 2020-10-15: 40 mg via INTRAVENOUS
  Filled 2020-10-15: qty 2

## 2020-10-15 NOTE — Progress Notes (Signed)
PROGRESS NOTE    Kyle Torres  NWG:[id number] DOB: 1934-09-24 DOA: 10/13/2020 PCP: Rochel Brome, MD    Brief Narrative:  86 y.o. male with medical history significant for hypertension, OSA on BiPAP, history of CVA, BPH, asthma, and GERD, now presenting to the emergency department for evaluation of fevers, off-balance gait, and urinary incontinence.  Patient is accompanied by his stepdaughter who assists with the history.  He was reportedly in his usual state of health on 10/10/2020 but developed fevers and difficulty maintaining his balance while walking the following day.  Since then, he has continued to have fevers as high as 104 F at home despite using acetaminophen and ibuprofen. Outpt MRI done prior to this admit was reportedly found to be unremarkable  Assessment & Plan:   Principal Problem:   SIRS (systemic inflammatory response syndrome) (HCC) Active Problems:   Essential hypertension, malignant   History of CVA (cerebrovascular accident)   Lesion of urinary bladder   AAA (abdominal aortic aneurysm) (HCC)   Sepsis secondary to UTI ([place])  1. Sepsis with likely UTI present on admission - Patient presents with 3-4 days of fevers and off-balance gait, was given IM Rocephin and cefdinir Rx on 11/1 for possible UTI though he denies dysuria, flank pain, or suprapubic discomfort  - Pt without respiratory symptoms, COVID/influenza/RSV pcr negative, and no acute findings on CXR  - Abdominal exam benign and no source of symptoms noted on CT abd/pelvis  - Pt is without headache, neck stiffness, or confusion   - Macular rash noted at time of presentation. Noted to be pruritic and seems to be spreading up the trunk to the scalp. Currently receiving PRN benadryl. Will give trial of one dose of steroid - Blood and urine cultures were collected in ED and he was started on broad-spectrum empiric antibiotics, now narrowed to cefepime monotherapy - Pan-cultures are neg although UA does have  blood and WBC. Pt also noted to have some dysuria -Repeat cbc in AM  2. Hypertension  - BP stable at present -Pt had been continued on ACEI. Given diffuse rash, will hold for now -Cont to monitor BP closely  3. OSA  - Continue BiPAP qHS as tolerated  4. Urinary bladder lesion  - Enhancing nodule within urinary bladder noted on CT and most suggestive of primary urothelial carcinoma  - Recommend follow up with Urology after pt is discharged  5. AAA  - 3.0 cm AAA noted incidentally on CT in ED  - Recommend close outpatient follow up after pt is discharged  6. History of CVA  - Patient had outpatient MRI brain on 10/12/20 that was reportedly negative for acute findings but revealed old CVA  - Continue ASA as pt tolerates  7. Asthma  - No cough or wheezing on admission  - Continue Asmanex and prn albuterol   DVT prophylaxis: Lovenox subq Code Status: Full Family Communication: Pt in room, family not at bedside  Status is: Observation  The patient will require care spanning > 2 midnights and should be moved to inpatient because: IV treatments appropriate due to intensity of illness or inability to take PO and Inpatient level of care appropriate due to severity of illness  Dispo: The patient is from: Home              Anticipated d/c is to: Home              Anticipated d/c date is: 2 days  Patient currently is not medically stable to d/c.  Consultants:     Procedures:     Antimicrobials: Anti-infectives (From admission, onward)   Start     Dose/Rate Route Frequency Ordered Stop   10/14/20 1200  ceFEPIme (MAXIPIME) 2 g in sodium chloride 0.9 % 100 mL IVPB  Status:  Discontinued        2 g 200 mL/hr over 30 Minutes Intravenous Every 12 hours 10/14/20 0406 10/14/20 1112   10/14/20 1200  vancomycin (VANCOREADY) IVPB 750 mg/150 mL  Status:  Discontinued        750 mg 150 mL/hr over 60 Minutes Intravenous Every 12 hours 10/14/20 0406 10/14/20 1659    10/14/20 1200  ceFEPIme (MAXIPIME) 2 g in sodium chloride 0.9 % 100 mL IVPB        2 g 200 mL/hr over 30 Minutes Intravenous Every 8 hours 10/14/20 1112     10/14/20 0400  metroNIDAZOLE (FLAGYL) IVPB 500 mg  Status:  Discontinued        500 mg 100 mL/hr over 60 Minutes Intravenous Every 8 hours 10/14/20 0352 10/14/20 1659   10/13/20 2345  vancomycin (VANCOREADY) IVPB 1500 mg/300 mL        1,500 mg 150 mL/hr over 120 Minutes Intravenous  Once 10/13/20 2336 10/14/20 0313   10/13/20 2345  ceFEPIme (MAXIPIME) 2 g in sodium chloride 0.9 % 100 mL IVPB        2 g 200 mL/hr over 30 Minutes Intravenous  Once 10/13/20 2336 10/14/20 0031      Subjective: States feeling much better. However rash seems to be progressing today  Objective: Vitals:   10/15/20 0352 10/15/20 0840 10/15/20 1118 10/15/20 1258  BP: (!) 152/77  (!) 142/78 123/82  Pulse: 70  85 75  Resp: 18  16 12   Temp: 99.5 F (37.5 C)   98.2 F (36.8 C)  TempSrc: Oral   Oral  SpO2: 96% 95% 96% 97%  Weight:      Height:        Intake/Output Summary (Last 24 hours) at 10/15/2020 1722 Last data filed at 10/15/2020 1608 Gross per 24 hour  Intake 303 ml  Output 525 ml  Net -222 ml   Filed Weights   10/13/20 2201 10/13/20 2304  Weight: 93.4 kg 90.7 kg    Examination: General exam: Awake, laying in bed, in nad Respiratory system: Normal respiratory effort, no wheezing Cardiovascular system: regular rate, s1, s2 Gastrointestinal system: Soft, nondistended, positive BS Central nervous system: CN2-12 grossly intact, strength intact Extremities: Perfused, no clubbing Skin: Normal skin turgor, diffuse papular rash most prominent over L gluteal region, extending across the pelvic region, over trunk and extending to scalp Psychiatry: Mood normal // no visual hallucinations \  Data Reviewed: I have personally reviewed following labs and imaging studies  CBC: Recent Labs  Lab 10/12/20 1202 10/13/20 2255 10/14/20 0436  10/15/20 0422  WBC 8.0 4.8 4.4 5.2  NEUTROABS 6.5 3.8 3.5 3.4  HGB 12.2* 12.4* 11.2* 10.8*  HCT 36.3* 38.3* 33.9* 33.1*  MCV 94 96.7 96.0 96.2  PLT 173 120* 119* 811*   Basic Metabolic Panel: Recent Labs  Lab 10/12/20 1202 10/13/20 2255 10/14/20 0436 10/15/20 0422  NA 140 134* 135 134*  K 4.6 3.6 3.4* 4.2  CL 105 103 104 101  CO2 21 21* 23 23  GLUCOSE 113* 116* 107* 92  BUN 16 21 20 19   CREATININE 0.96 1.03 0.94 0.87  CALCIUM 8.7 8.1* 7.9* 8.1*  MG  --   --  1.8  --    GFR: Estimated Creatinine Clearance: 70.4 mL/min (by C-G formula based on SCr of 0.87 mg/dL). Liver Function Tests: Recent Labs  Lab 10/12/20 1202 10/13/20 2255  AST 36 38  ALT 32 33  ALKPHOS 66 50  BILITOT 0.3 0.3  PROT 6.3 6.9  ALBUMIN 3.7 3.4*   No results for input(s): LIPASE, AMYLASE in the last 168 hours. No results for input(s): AMMONIA in the last 168 hours. Coagulation Profile: Recent Labs  Lab 10/13/20 2255  INR 1.2   Cardiac Enzymes: No results for input(s): CKTOTAL, CKMB, CKMBINDEX, TROPONINI in the last 168 hours. BNP (last 3 results) No results for input(s): PROBNP in the last 8760 hours. HbA1C: No results for input(s): HGBA1C in the last 72 hours. CBG: No results for input(s): GLUCAP in the last 168 hours. Lipid Profile: No results for input(s): CHOL, HDL, LDLCALC, TRIG, CHOLHDL, LDLDIRECT in the last 72 hours. Thyroid Function Tests: No results for input(s): TSH, T4TOTAL, FREET4, T3FREE, THYROIDAB in the last 72 hours. Anemia Panel: No results for input(s): VITAMINB12, FOLATE, FERRITIN, TIBC, IRON, RETICCTPCT in the last 72 hours. Sepsis Labs: Recent Labs  Lab 10/13/20 2255 10/14/20 0436  PROCALCITON  --  0.22  LATICACIDVEN 1.2  --     Recent Results (from the past 240 hour(s))  Urine Culture     Status: None   Collection Time: 10/12/20 11:55 AM   Specimen: Urine   UR  Result Value Ref Range Status   Urine Culture, Routine Final report  Final   Organism ID,  Bacteria Comment  Final    Comment: Mixed urogenital flora 25,000-50,000 colony forming units per mL   Blood Culture (routine x 2)     Status: None (Preliminary result)   Collection Time: 10/13/20 10:55 PM   Specimen: BLOOD  Result Value Ref Range Status   Specimen Description   Final    BLOOD LEFT ANTECUBITAL Performed at [facility], [place] [address] [place], [place] [address]    Special Requests   Final    BOTTLES DRAWN AEROBIC ONLY Blood Culture results may not be optimal due to an inadequate volume of blood received in culture bottles Performed at [facility] [address], [place], [place] [address]    Culture   Final    NO GROWTH 1 DAY Performed at [facility], [place] [address] [place], [place] [address]    Report Status PENDING  Incomplete  Blood Culture (routine x 2)     Status: None (Preliminary result)   Collection Time: 10/13/20 10:55 PM   Specimen: BLOOD  Result Value Ref Range Status   Specimen Description   Final    BLOOD RIGHT ANTECUBITAL Performed at [facility] [address], [place], [place] [address]    Special Requests   Final    BOTTLES DRAWN AEROBIC AND ANAEROBIC Blood Culture adequate volume Performed at [facility] [address], [place], [place] [address]    Culture   Final    NO GROWTH 1 DAY Performed at [facility], [place] [address] [place], [place] [address]    Report Status PENDING  Incomplete  Urine culture     Status: None   Collection Time: 10/13/20 10:55 PM   Specimen: In/Out Cath Urine  Result Value Ref Range Status   Specimen Description   Final    IN/OUT CATH URINE Performed at  [facility], [place] [address] [place], [place] [address]    Special Requests   Final    NONE Performed at [facility], [place] [address] [place], [place] [address]    Culture   Final    NO GROWTH Performed at [facility], [place] [address] [place], [place] [address]    Report Status 10/14/2020 FINAL  Final  Resp Panel by RT PCR (RSV, Flu A&B, Covid) - Nasopharyngeal Swab     Status: None   Collection Time: 10/13/20 10:55 PM   Specimen: Nasopharyngeal Swab  Result Value Ref Range Status   SARS Coronavirus 2 by RT PCR NEGATIVE NEGATIVE Final    Comment: (NOTE) SARS-CoV-2 target nucleic acids are NOT DETECTED.  The SARS-CoV-2 RNA is generally detectable in upper respiratoy specimens during the acute phase of infection. The lowest concentration of SARS-CoV-2 viral copies this assay can detect is 131 copies/mL. A negative result does not preclude SARS-Cov-2 infection and should not be used as the sole basis for treatment or other patient management decisions. A negative result may occur with  improper specimen collection/handling, submission of specimen other than nasopharyngeal swab, presence of viral mutation(s) within the areas targeted by this assay, and inadequate number of viral copies (<131 copies/mL). A negative result must be combined with clinical observations, patient history, and epidemiological information. The expected result is Negative.  Fact Sheet for Patients:  PinkCheek.be  Fact Sheet for Healthcare Providers:  GravelBags.it  This test is no t yet approved or cleared by the [place] FDA and  has been authorized for detection and/or diagnosis of SARS-CoV-2 by FDA under an Emergency Use Authorization (EUA). This EUA will remain  in effect (meaning this test can be used) for the duration of the COVID-19 declaration under Section 564(b)(1) of the Act, 21 U.S.C. section 360bbb-3(b)(1), unless the authorization is terminated or revoked sooner.     Influenza A by PCR NEGATIVE NEGATIVE Final   Influenza B by PCR NEGATIVE NEGATIVE Final    Comment: (NOTE) The Xpert Xpress SARS-CoV-2/FLU/RSV assay is intended as an aid  in  the diagnosis of influenza from Nasopharyngeal swab specimens and  should not be used as a sole basis for treatment. Nasal washings and  aspirates are unacceptable for Xpert Xpress SARS-CoV-2/FLU/RSV  testing.  Fact Sheet for Patients: PinkCheek.be  Fact Sheet for Healthcare Providers: GravelBags.it  This test is not yet approved or cleared by the [place] FDA and  has been authorized for detection and/or diagnosis of SARS-CoV-2 by  FDA under an Emergency Use Authorization (EUA). This EUA will remain  in effect (meaning this test can be used) for the duration of the  Covid-19 declaration under Section 564(b)(1) of the Act, 21  U.S.C. section 360bbb-3(b)(1), unless the authorization is  terminated or revoked.    Respiratory Syncytial Virus by PCR NEGATIVE NEGATIVE Final    Comment: (NOTE) Fact Sheet for Patients: PinkCheek.be  Fact Sheet for Healthcare Providers: GravelBags.it  This test is not yet approved or cleared by the [place] FDA and  has been authorized for detection and/or diagnosis of SARS-CoV-2 by  FDA under an Emergency Use Authorization (EUA). This EUA will remain  in effect (meaning this test can be used) for the duration of the  COVID-19 declaration under Section 564(b)(1) of the Act, 21 U.S.C.  section 360bbb-3(b)(1), unless the authorization is terminated or  revoked. Performed at [facility], [address] [place], [place]  [address]      Radiology Studies: CT ABDOMEN PELVIS W CONTRAST  Result Date: 10/14/2020 CLINICAL DATA:  Hematuria, gait abnormality, urinary and fecal incontinence EXAM: CT ABDOMEN AND PELVIS WITH CONTRAST TECHNIQUE: Multidetector CT imaging of the abdomen and pelvis was performed using the standard protocol following bolus administration of intravenous contrast. CONTRAST:  143mL OMNIPAQUE  IOHEXOL 300 MG/ML  SOLN COMPARISON:  04/01/2009 FINDINGS: Lower chest: Elevation of the left hemidiaphragm again noted. Progressive bibasilar pulmonary fibrotic change. Extensive coronary artery calcification. Global cardiac size within normal limits. The visualized pulmonary arterial tree is dilated centrally in keeping with pulmonary arterial hypertension. Stable scattered pericardial calcification Hepatobiliary: Cholecystectomy has been performed. Liver unremarkable. No intra or extrahepatic biliary ductal dilation. Pancreas: Unremarkable Spleen: Unremarkable Adrenals/Urinary Tract: The adrenal glands are unremarkable. The kidneys are normal in size and position. Multiple parapelvic cysts are seen bilaterally. No hydronephrosis. No intrarenal or ureteral calculi are identified. A 13 mm enhancing mural nodule is seen within the left anterior bladder wall most in keeping with a primary urothelial carcinoma. The prostate gland is moderately enlarged and indents the base of the bladder. The bladder is not distended though the bladder wall appears mildly thickened suggesting changes of chronic bladder outlet obstruction. Stomach/Bowel: There is moderate stool seen throughout the colon and rectal vault. No evidence of obstruction. The stomach, small bowel, and large bowel are otherwise unremarkable. Appendix normal. No free intraperitoneal gas or fluid. Vascular/Lymphatic: Moderate aortoiliac atherosclerotic calcification. 3.0 cm fusiform infrarenal abdominal aortic aneurysm is present. Several shotty lymph nodes are seen within the inguinal lymph node groups bilaterally with at least 1 lymph node within the left inguinal region demonstrating a prominent rounded configuration. No frankly pathologic adenopathy within the abdomen and pelvis by size criteria. Reproductive: The prostate gland, as noted above, is moderately enlarged, particularly centrally. Seminal vesicles are unremarkable. Other: None significant  Musculoskeletal: No acute bone abnormality. L4-5 posterior lumbar fusion with resection of the L4 spinous process is noted. Degenerative changes are seen throughout the lumbar spine. No lytic or blastic bone lesions. IMPRESSION: 13 mm enhancing nodule within the bladder anteriorly most in keeping with a primary urothelial carcinoma. Cystoscopic correlation is recommended. Shotty left inguinal adenopathy is not frankly pathologic, however, PET CT examination may be helpful to definitively exclude nodal involvement. Alternatively, close attention on subsequent examination would be helpful to ensure stability or resolution Moderate prostatic enlargement, particularly centrally, with secondary changes suggesting at least mild chronic bladder outlet obstruction. 3.0 cm fusiform infrarenal abdominal aortic aneurysm Recommend follow-up ultrasound every 3 years. This recommendation follows ACR consensus guidelines: White Paper of the ACR Incidental Findings Committee II on Vascular Findings. J Am Coll Radiol 2013; 10:789-794. Extensive coronary artery calcification Aortic aneurysm NOS (ICD10-I71.9). Aortic Atherosclerosis (ICD10-I70.0). Electronically Signed   By: Fidela Salisbury MD   On: 10/14/2020 02:45   CT L-SPINE NO CHARGE  Result Date: 10/14/2020 CLINICAL DATA:  Gait imbalance, fecal and urinary incontinence EXAM: CT LUMBAR SPINE WITHOUT CONTRAST TECHNIQUE: Multidetector CT imaging of the lumbar spine was performed without intravenous contrast administration. Multiplanar CT image reconstructions were also generated. COMPARISON:  None. FINDINGS: Segmentation: 5 non rib bearing segments of the lumbar spine. Alignment: Posterior lumbar fusion with instrumentation of L4-5 is been performed with bilateral pedicle screws and posterior bars. There is grade 1 anterolisthesis of L4 upon L5. Partial resection of the spinous process of L4 has been performed. Minimal retrolisthesis of L1 upon L2 and L2 upon L3 is likely  degenerative in  nature Vertebrae: No acute fracture or focal pathologic process identified. Paraspinal and other soft tissues: Negative. Disc levels: Review of the sagittal reformats demonstrates normal lumbar lordosis. There is diffuse intervertebral disc space narrowing and endplate remodeling throughout the lumbar spine, most severe at L3-4 and L4-5 and L5-S1 with vacuum disc phenomena noted at these levels. Vertebral body height has been preserved. Review of the axial images demonstrates: T12-L1: No canal stenosis or neural foraminal narrowing. No significant facet arthrosis. L1-2: Mild facet arthrosis. Posterior disc osteophyte complex results in mild bilateral neural foraminal narrowing. L2-3: Mild bilateral facet arthrosis. No significant neural foraminal narrowing. No significant canal stenosis. L3-4: Marked bilateral facet arthrosis. No significant canal stenosis. At least moderate central canal stenosis, not well evaluated on this non myelographic study. L4-5: No significant neural foraminal narrowing.  No canal stenosis. L5-S1: Mild to moderate bilateral neural foraminal narrowing secondary to posterior disc osteophyte complex. No canal stenosis. IMPRESSION: No acute fracture or traumatic listhesis. Status post L4-5 posterior lumbar fusion with instrumentation with partial resection of the L4 spinous process. Moderate central canal stenosis at L3-4, not well characterized on this examination. CT myelography may be helpful to assess the degree of central canal stenosis. Electronically Signed   By: Fidela Salisbury MD   On: 10/14/2020 02:55   DG Chest Port 1 View  Result Date: 10/13/2020 CLINICAL DATA:  Sepsis, weakness EXAM: PORTABLE CHEST 1 VIEW COMPARISON:  10/11/2020 FINDINGS: Stable elevation of the left hemidiaphragm. Lungs are clear. No pneumothorax or pleural effusion. Mild cardiomegaly is stable since prior examination. Pulmonary vascularity is normal when accounting for altered patient  positioning. No acute bone abnormality. IMPRESSION: No active disease.  Stable elevation of the left hemidiaphragm. Electronically Signed   By: Fidela Salisbury MD   On: 10/13/2020 23:15    Scheduled Meds: . aspirin EC  81 mg Oral Daily  . enoxaparin (LOVENOX) injection  40 mg Subcutaneous Q24H  . finasteride  5 mg Oral Daily  . fluticasone  2 puff Inhalation BID  . lisinopril  20 mg Oral Daily  . pantoprazole  40 mg Oral Daily  . sodium chloride flush  3 mL Intravenous Q12H  . terazosin  5 mg Oral BID   Continuous Infusions: . ceFEPime (MAXIPIME) IV 2 g (10/15/20 1346)     LOS: 1 day   Marylu Lund, MD [facility] Pager On Amion  If 7PM-7AM, please contact night-coverage 10/15/2020, 5:22 PM

## 2020-10-15 NOTE — Progress Notes (Signed)
Pt has rash....red petechiae- upper trunk all over back and "splotchy" lower abd and chest area, to inner groin down bil thighs. Burning and "itchy" noted, cool cloth and cream/powder applied to back. MD made ware and orders noted. SRP, RN

## 2020-10-15 NOTE — Progress Notes (Signed)
Pt has refused BIPAP QHS.  Pt states that he hasn't worn CPAP in about 5 years.  RT to monitor and assess as needed.

## 2020-10-15 NOTE — Plan of Care (Signed)

## 2020-10-15 NOTE — Progress Notes (Signed)
Spoke with daughter over the phone regarding a pt update, letting her know that he wouldn't be discharged today. The daughter stated that she was trying to plan ahead of time in case the pt was going to be discharged soon. Jerene Pitch

## 2020-10-16 ENCOUNTER — Other Ambulatory Visit: Payer: Self-pay | Admitting: Internal Medicine

## 2020-10-16 LAB — COMPREHENSIVE METABOLIC PANEL
ALT: 28 U/L (ref 0–44)
AST: 30 U/L (ref 15–41)
Albumin: 3 g/dL — ABNORMAL LOW (ref 3.5–5.0)
Alkaline Phosphatase: 50 U/L (ref 38–126)
Anion gap: 9 (ref 5–15)
BUN: 16 mg/dL (ref 8–23)
CO2: 24 mmol/L (ref 22–32)
Calcium: 8.7 mg/dL — ABNORMAL LOW (ref 8.9–10.3)
Chloride: 105 mmol/L (ref 98–111)
Creatinine, Ser: 0.83 mg/dL (ref 0.61–1.24)
GFR, Estimated: >60 mL/min (ref >60)
Glucose, Bld: 146 mg/dL — ABNORMAL HIGH (ref 70–99)
Potassium: 4.5 mmol/L (ref 3.5–5.1)
Sodium: 138 mmol/L (ref 135–145)
Total Bilirubin: 0.3 mg/dL (ref 0.3–1.2)
Total Protein: 6.6 g/dL (ref 6.5–8.1)

## 2020-10-16 LAB — CBC WITH DIFFERENTIAL/PLATELET
Abs Immature Granulocytes: 0.03 10*3/uL (ref 0.00–0.07)
Basophils Absolute: 0 10*3/uL (ref 0.0–0.1)
Basophils Relative: 1 %
Eosinophils Absolute: 0 10*3/uL (ref 0.0–0.5)
Eosinophils Relative: 0 %
HCT: 37.2 % — ABNORMAL LOW (ref 39.0–52.0)
Hemoglobin: 12.3 g/dL — ABNORMAL LOW (ref 13.0–17.0)
Immature Granulocytes: 1 %
Lymphocytes Relative: 33 %
Lymphs Abs: 1.2 10*3/uL (ref 0.7–4.0)
MCH: 31.4 pg (ref 26.0–34.0)
MCHC: 33.1 g/dL (ref 30.0–36.0)
MCV: 94.9 fL (ref 80.0–100.0)
Monocytes Absolute: 0.2 10*3/uL (ref 0.1–1.0)
Monocytes Relative: 5 %
Neutro Abs: 2.2 10*3/uL (ref 1.7–7.7)
Neutrophils Relative %: 60 %
Platelets: 123 10*3/uL — ABNORMAL LOW (ref 150–400)
RBC: 3.92 MIL/uL — ABNORMAL LOW (ref 4.22–5.81)
RDW: 13.5 % (ref 11.5–15.5)
WBC: 3.6 10*3/uL — ABNORMAL LOW (ref 4.0–10.5)
nRBC: 0 % (ref 0.0–0.2)

## 2020-10-16 MED ORDER — PREDNISONE 10 MG PO TABS: ORAL_TABLET | ORAL | 0 refills | Status: AC

## 2020-10-16 MED ORDER — AMLODIPINE BESYLATE 5 MG PO TABS: 5.0000 mg | ORAL_TABLET | Freq: Every day | ORAL | 0 refills | Status: DC

## 2020-10-16 MED ORDER — CEFDINIR 300 MG PO CAPS: 300.0000 mg | ORAL_CAPSULE | Freq: Two times a day (BID) | ORAL | 0 refills | Status: AC

## 2020-10-16 MED ORDER — CEFPODOXIME PROXETIL 100 MG PO TABS: 100.0000 mg | ORAL_TABLET | Freq: Two times a day (BID) | ORAL | 0 refills | Status: DC

## 2020-10-16 MED ORDER — HYDRALAZINE HCL 20 MG/ML IJ SOLN: 5.0000 mg | INTRAMUSCULAR | Status: DC | PRN

## 2020-10-16 MED ORDER — AMLODIPINE BESYLATE 5 MG PO TABS
5.0000 mg | ORAL_TABLET | Freq: Every day | ORAL | Status: DC
  Administered 2020-10-16: 5 mg via ORAL
  Filled 2020-10-16: qty 1

## 2020-10-16 NOTE — Discharge Summary (Signed)
Physician Discharge Summary  Kyle Torres [id number] DOB: February 11, 1934 DOA: 10/13/2020  PCP: Rochel Brome, MD  Admit date: 10/13/2020 Discharge date: 10/16/2020  Admitted From: Home Disposition:  Home  Recommendations for Outpatient Follow-up:  1. Follow up with PCP in 1-2 weeks 2. Follow up with Urology as scheduled 3. Follow up with Dermatology as needed  Discharge Condition:Improved CODE STATUS:Full Diet recommendation: Regular   Brief/Interim Summary: 86 y.o.malewith medical history significant forhypertension, OSA on BiPAP, history of CVA, BPH, asthma, and GERD, now presenting to the emergency department for evaluation of fevers, off-balance gait, and urinary incontinence. Patient is accompanied by his stepdaughter who assists with the history. He was reportedly in his usual state of health on 10/10/2020 but developed fevers and difficulty maintaining his balance while walking the following day. Since then, he has continued to have fevers as high as 104 Fat home despite using acetaminophen and ibuprofen. Outpt MRI done prior to this admit was reportedly found to be unremarkable  Discharge Diagnoses:  Principal Problem:   SIRS (systemic inflammatory response syndrome) (HCC) Active Problems:   Essential hypertension, malignant   History of CVA (cerebrovascular accident)   Lesion of urinary bladder   AAA (abdominal aortic aneurysm) (HCC)   Sepsis secondary to UTI ([place])   1.Sepsis with likely UTI present on admission -Patient presents with 3-4 days of fevers and off-balance gait, was given IM Rocephin and cefdinir Rx on 11/1 for possible UTI though he denies dysuria, flank pain, or suprapubic discomfort -Pt without respiratory symptoms, COVID/influenza/RSV pcr negative, and no acute findings on CXR -Abdominal exam benign and no source of symptoms noted on CT abd/pelvis -Pt is without headache, neck stiffness, or confusion -Macular rash noted at time of  presentation. Noted to be pruritic and seemed to spread up the trunk to the scalp, ultimately improved with benadryl, solumedrol and famotidine -Blood and urine cultures were collected in ED and he was started on broad-spectrum empiric antibiotics, later narrowed to cefepime monotherapy - Pan-cultures are neg although UA does have blood and WBC. Pt to complete 2 more days of omnicef on d/c  2.Hypertension -BP stable at present -Pt had been continued on ACEI. Given diffuse rash, held at time of d/c -Have started pt on norvasc instead for bp control  3.OSA -Continue BiPAP qHSas tolerated  4.Urinary bladder lesion -Enhancing nodule within urinary bladder noted on CT and most suggestive of primary urothelial carcinoma -Have recommended close follow up with Urology after pt is discharged  5.AAA -3.0 cm AAA noted incidentally on CT in ED -Recommend close outpatient follow up after pt is discharged  6.History of CVA -Patient had outpatient MRI brain on 10/12/20 that was reportedly negative for acute findings but revealed old CVA -Continue ASA as pt tolerates  7. Asthma  - No cough or wheezing on admission  - Continue Asmanex and prn albuterol   Discharge Instructions   Allergies as of 10/16/2020   No Known Allergies     Medication List    STOP taking these medications   cefdinir 300 MG capsule Commonly known as: OMNICEF   lisinopril 20 MG tablet Commonly known as: ZESTRIL     TAKE these medications   acetaminophen 500 MG tablet Commonly known as: TYLENOL Take 1,000 mg by mouth every 6 (six) hours as needed for mild pain.   albuterol 108 (90 Base) MCG/ACT inhaler Commonly known as: VENTOLIN HFA Inhale 1 puff into the lungs every 6 (six) hours as needed. FOR SHORTNESS OF BREATH  amLODipine 5 MG tablet Commonly known as: NORVASC Take 1 tablet (5 mg total) by mouth daily.   aspirin EC 81 MG tablet Take 1 tablet (81 mg total) by mouth  daily.   cefpodoxime 100 MG tablet Commonly known as: VANTIN Take 1 tablet (100 mg total) by mouth 2 (two) times daily for 2 days.   finasteride 5 MG tablet Commonly known as: PROSCAR Take 5 mg by mouth daily.   ibuprofen 200 MG tablet Commonly known as: ADVIL Take 200 mg by mouth every 6 (six) hours as needed for moderate pain.   mometasone 220 MCG/INH inhaler Commonly known as: ASMANEX Inhale 1 puff into the lungs 2 (two) times daily.   multivitamin tablet Take 1 tablet by mouth daily.   omeprazole 20 MG capsule Commonly known as: PRILOSEC Take 20 mg by mouth 2 (two) times daily.   predniSONE 10 MG tablet Commonly known as: DELTASONE Take 4 tablets (40 mg total) by mouth daily with breakfast for 2 days, THEN 2 tablets (20 mg total) daily with breakfast for 2 days, THEN 1 tablet (10 mg total) daily with breakfast for 2 days, THEN 0.5 tablets (5 mg total) daily with breakfast for 2 days. Start taking on: October 16, 2020   terazosin 5 MG capsule Commonly known as: HYTRIN Take 5 mg by mouth 2 (two) times daily.       Follow-up Information    Cox, Kirsten, MD. Schedule an appointment as soon as possible for a visit in 1 week(s).   Specialties: Family Medicine, Interventional Cardiology, Radiology, Anesthesiology Contact information: [place] [address] [phone]        [facility]. Schedule an appointment as soon as possible for a visit.   Contact information: [place] [facility] [facility] (Dermatology) Follow up.   Why: as needed Contact information: [place], [place] [address] [phone]             No Known Allergies   Procedures/Studies: CT ABDOMEN PELVIS W CONTRAST  Result Date: 10/14/2020 CLINICAL DATA:  Hematuria, gait abnormality, urinary and fecal incontinence EXAM: CT ABDOMEN AND PELVIS  WITH CONTRAST TECHNIQUE: Multidetector CT imaging of the abdomen and pelvis was performed using the standard protocol following bolus administration of intravenous contrast. CONTRAST:  117mL OMNIPAQUE IOHEXOL 300 MG/ML  SOLN COMPARISON:  04/01/2009 FINDINGS: Lower chest: Elevation of the left hemidiaphragm again noted. Progressive bibasilar pulmonary fibrotic change. Extensive coronary artery calcification. Global cardiac size within normal limits. The visualized pulmonary arterial tree is dilated centrally in keeping with pulmonary arterial hypertension. Stable scattered pericardial calcification Hepatobiliary: Cholecystectomy has been performed. Liver unremarkable. No intra or extrahepatic biliary ductal dilation. Pancreas: Unremarkable Spleen: Unremarkable Adrenals/Urinary Tract: The adrenal glands are unremarkable. The kidneys are normal in size and position. Multiple parapelvic cysts are seen bilaterally. No hydronephrosis. No intrarenal or ureteral calculi are identified. A 13 mm enhancing mural nodule is seen within the left anterior bladder wall most in keeping with a primary urothelial carcinoma. The prostate gland is moderately enlarged and indents the base of the bladder. The bladder is not distended though the bladder wall appears mildly thickened suggesting changes of chronic bladder outlet obstruction. Stomach/Bowel: There is moderate stool seen throughout the colon and rectal vault. No evidence of obstruction. The stomach, small bowel, and large bowel are otherwise unremarkable. Appendix normal. No free intraperitoneal gas  or fluid. Vascular/Lymphatic: Moderate aortoiliac atherosclerotic calcification. 3.0 cm fusiform infrarenal abdominal aortic aneurysm is present. Several shotty lymph nodes are seen within the inguinal lymph node groups bilaterally with at least 1 lymph node within the left inguinal region demonstrating a prominent rounded configuration. No frankly pathologic adenopathy within the  abdomen and pelvis by size criteria. Reproductive: The prostate gland, as noted above, is moderately enlarged, particularly centrally. Seminal vesicles are unremarkable. Other: None significant Musculoskeletal: No acute bone abnormality. L4-5 posterior lumbar fusion with resection of the L4 spinous process is noted. Degenerative changes are seen throughout the lumbar spine. No lytic or blastic bone lesions. IMPRESSION: 13 mm enhancing nodule within the bladder anteriorly most in keeping with a primary urothelial carcinoma. Cystoscopic correlation is recommended. Shotty left inguinal adenopathy is not frankly pathologic, however, PET CT examination may be helpful to definitively exclude nodal involvement. Alternatively, close attention on subsequent examination would be helpful to ensure stability or resolution Moderate prostatic enlargement, particularly centrally, with secondary changes suggesting at least mild chronic bladder outlet obstruction. 3.0 cm fusiform infrarenal abdominal aortic aneurysm Recommend follow-up ultrasound every 3 years. This recommendation follows ACR consensus guidelines: White Paper of the ACR Incidental Findings Committee II on Vascular Findings. J Am Coll Radiol 2013; 10:789-794. Extensive coronary artery calcification Aortic aneurysm NOS (ICD10-I71.9). Aortic Atherosclerosis (ICD10-I70.0). Electronically Signed   By: Fidela Salisbury MD   On: 10/14/2020 02:45   CT L-SPINE NO CHARGE  Result Date: 10/14/2020 CLINICAL DATA:  Gait imbalance, fecal and urinary incontinence EXAM: CT LUMBAR SPINE WITHOUT CONTRAST TECHNIQUE: Multidetector CT imaging of the lumbar spine was performed without intravenous contrast administration. Multiplanar CT image reconstructions were also generated. COMPARISON:  None. FINDINGS: Segmentation: 5 non rib bearing segments of the lumbar spine. Alignment: Posterior lumbar fusion with instrumentation of L4-5 is been performed with bilateral pedicle screws and  posterior bars. There is grade 1 anterolisthesis of L4 upon L5. Partial resection of the spinous process of L4 has been performed. Minimal retrolisthesis of L1 upon L2 and L2 upon L3 is likely degenerative in nature Vertebrae: No acute fracture or focal pathologic process identified. Paraspinal and other soft tissues: Negative. Disc levels: Review of the sagittal reformats demonstrates normal lumbar lordosis. There is diffuse intervertebral disc space narrowing and endplate remodeling throughout the lumbar spine, most severe at L3-4 and L4-5 and L5-S1 with vacuum disc phenomena noted at these levels. Vertebral body height has been preserved. Review of the axial images demonstrates: T12-L1: No canal stenosis or neural foraminal narrowing. No significant facet arthrosis. L1-2: Mild facet arthrosis. Posterior disc osteophyte complex results in mild bilateral neural foraminal narrowing. L2-3: Mild bilateral facet arthrosis. No significant neural foraminal narrowing. No significant canal stenosis. L3-4: Marked bilateral facet arthrosis. No significant canal stenosis. At least moderate central canal stenosis, not well evaluated on this non myelographic study. L4-5: No significant neural foraminal narrowing.  No canal stenosis. L5-S1: Mild to moderate bilateral neural foraminal narrowing secondary to posterior disc osteophyte complex. No canal stenosis. IMPRESSION: No acute fracture or traumatic listhesis. Status post L4-5 posterior lumbar fusion with instrumentation with partial resection of the L4 spinous process. Moderate central canal stenosis at L3-4, not well characterized on this examination. CT myelography may be helpful to assess the degree of central canal stenosis. Electronically Signed   By: Fidela Salisbury MD   On: 10/14/2020 02:55   DG Chest Port 1 View  Result Date: 10/13/2020 CLINICAL DATA:  Sepsis, weakness EXAM: PORTABLE CHEST 1 VIEW COMPARISON:  10/11/2020 FINDINGS: Stable elevation of the left  hemidiaphragm. Lungs are clear. No pneumothorax or pleural effusion. Mild cardiomegaly is stable since prior examination. Pulmonary vascularity is normal when accounting for altered patient positioning. No acute bone abnormality. IMPRESSION: No active disease.  Stable elevation of the left hemidiaphragm. Electronically Signed   By: Fidela Salisbury MD   On: 10/13/2020 23:15     Subjective: Very eager to go home  Discharge Exam: Vitals:   10/16/20 0506 10/16/20 0700  BP: (!) 177/89   Pulse: 64   Resp: 15   Temp: 97.8 F (36.6 C)   SpO2: 94% 94%   Vitals:   10/15/20 2000 10/15/20 2120 10/16/20 0506 10/16/20 0700  BP:  (!) 183/82 (!) 177/89   Pulse:  64 64   Resp:  17 15   Temp:  98 F (36.7 C) 97.8 F (36.6 C)   TempSrc:  Oral Oral   SpO2: 94% 94% 94% 94%  Weight:      Height:        General: Pt is alert, awake, not in acute distress Cardiovascular: RRR, S1/S2 +, no rubs, no gallops Respiratory: CTA bilaterally, no wheezing, no rhonchi Abdominal: Soft, NT, ND, bowel sounds + Extremities: no edema, no cyanosis   The results of significant diagnostics from this hospitalization (including imaging, microbiology, ancillary and laboratory) are listed below for reference.     Microbiology: Recent Results (from the past 240 hour(s))  Urine Culture     Status: None   Collection Time: 10/12/20 11:55 AM   Specimen: Urine   UR  Result Value Ref Range Status   Urine Culture, Routine Final report  Final   Organism ID, Bacteria Comment  Final    Comment: Mixed urogenital flora 25,000-50,000 colony forming units per mL   Blood Culture (routine x 2)     Status: None (Preliminary result)   Collection Time: 10/13/20 10:55 PM   Specimen: BLOOD  Result Value Ref Range Status   Specimen Description   Final    BLOOD LEFT ANTECUBITAL Performed at [facility], [place] [address] [place], [place] [address]    Special Requests   Final    BOTTLES DRAWN AEROBIC ONLY  Blood Culture results may not be optimal due to an inadequate volume of blood received in culture bottles Performed at [facility] [address], [place], [place] [address]    Culture   Final    NO GROWTH 2 DAYS Performed at [place] [address], [place], [place] [address]    Report Status PENDING  Incomplete  Blood Culture (routine x 2)     Status: None (Preliminary result)   Collection Time: 10/13/20 10:55 PM   Specimen: BLOOD  Result Value Ref Range Status   Specimen Description   Final    BLOOD RIGHT ANTECUBITAL Performed at [facility] [address] [place], [place] [address]    Special Requests   Final    BOTTLES DRAWN AEROBIC AND ANAEROBIC Blood Culture adequate volume Performed at [facility] [address] [place], [place] [address]    Culture   Final    NO GROWTH 2 DAYS Performed at [facility] [address] [place], [place] [address]    Report Status PENDING  Incomplete  Urine culture     Status: None   Collection Time: 10/13/20 10:55 PM   Specimen: In/Out Cath Urine  Result Value Ref Range Status  Specimen Description   Final    IN/OUT CATH URINE Performed at [facility] [address], [place], [place] [address]    Special Requests   Final    NONE Performed at [facility], [place] [address], [place], [place] [address]    Culture   Final    NO GROWTH Performed at [facility], [place] [address] [place], [place] [address]    Report Status 10/14/2020 FINAL  Final  Resp Panel by RT PCR (RSV, Flu A&B, Covid) - Nasopharyngeal Swab     Status: None   Collection Time: 10/13/20 10:55 PM   Specimen: Nasopharyngeal Swab  Result Value Ref Range Status   SARS Coronavirus 2 by RT PCR NEGATIVE NEGATIVE Final    Comment: (NOTE) SARS-CoV-2 target nucleic acids are NOT DETECTED.  The SARS-CoV-2 RNA is generally detectable in upper  respiratoy specimens during the acute phase of infection. The lowest concentration of SARS-CoV-2 viral copies this assay can detect is 131 copies/mL. A negative result does not preclude SARS-Cov-2 infection and should not be used as the sole basis for treatment or other patient management decisions. A negative result may occur with  improper specimen collection/handling, submission of specimen other than nasopharyngeal swab, presence of viral mutation(s) within the areas targeted by this assay, and inadequate number of viral copies (<131 copies/mL). A negative result must be combined with clinical observations, patient history, and epidemiological information. The expected result is Negative.  Fact Sheet for Patients:  PinkCheek.be  Fact Sheet for Healthcare Providers:  GravelBags.it  This test is no t yet approved or cleared by the [place] FDA and  has been authorized for detection and/or diagnosis of SARS-CoV-2 by FDA under an Emergency Use Authorization (EUA). This EUA will remain  in effect (meaning this test can be used) for the duration of the COVID-19 declaration under Section 564(b)(1) of the Act, 21 U.S.C. section 360bbb-3(b)(1), unless the authorization is terminated or revoked sooner.     Influenza A by PCR NEGATIVE NEGATIVE Final   Influenza B by PCR NEGATIVE NEGATIVE Final    Comment: (NOTE) The Xpert Xpress SARS-CoV-2/FLU/RSV assay is intended as an aid in  the diagnosis of influenza from Nasopharyngeal swab specimens and  should not be used as a sole basis for treatment. Nasal washings and  aspirates are unacceptable for Xpert Xpress SARS-CoV-2/FLU/RSV  testing.  Fact Sheet for Patients: PinkCheek.be  Fact Sheet for Healthcare Providers: GravelBags.it  This test is not yet approved or cleared by the [place] FDA and  has been  authorized for detection and/or diagnosis of SARS-CoV-2 by  FDA under an Emergency Use Authorization (EUA). This EUA will remain  in effect (meaning this test can be used) for the duration of the  Covid-19 declaration under Section 564(b)(1) of the Act, 21  U.S.C. section 360bbb-3(b)(1), unless the authorization is  terminated or revoked.    Respiratory Syncytial Virus by PCR NEGATIVE NEGATIVE Final    Comment: (NOTE) Fact Sheet for Patients: PinkCheek.be  Fact Sheet for Healthcare Providers: GravelBags.it  This test is not yet approved or cleared by the [place] FDA and  has been authorized for detection and/or diagnosis of SARS-CoV-2 by  FDA under an Emergency Use Authorization (EUA). This EUA will remain  in effect (meaning this test can be used) for the duration of the  COVID-19 declaration under Section 564(b)(1) of the Act, 21 U.S.C.  section 360bbb-3(b)(1), unless the authorization is terminated or  revoked. Performed at [facility], [place] [address], [place], [place] [address]      Labs: BNP (last 3 results) No results for input(s): BNP in the last 8760 hours. Basic Metabolic Panel: Recent Labs  Lab 10/12/20 1202 10/13/20 2255 10/14/20 0436 10/15/20 0422 10/16/20 0436  NA 140 134* 135 134* 138  K 4.6 3.6 3.4* 4.2 4.5  CL 105 103 104 101 105  CO2 21 21* 23 23 24   GLUCOSE 113* 116* 107* 92 146*  BUN 16 21 20 19 16   CREATININE 0.96 1.03 0.94 0.87 0.83  CALCIUM 8.7 8.1* 7.9* 8.1* 8.7*  MG  --   --  1.8  --   --    Liver Function Tests: Recent Labs  Lab 10/12/20 1202 10/13/20 2255 10/16/20 0436  AST 36 38 30  ALT 32 33 28  ALKPHOS 66 50 50  BILITOT 0.3 0.3 0.3  PROT 6.3 6.9 6.6  ALBUMIN 3.7 3.4* 3.0*   No results for input(s): LIPASE, AMYLASE in the last 168 hours. No results for input(s): AMMONIA in the last 168 hours. CBC: Recent Labs  Lab 10/12/20 1202 10/13/20 2255  10/14/20 0436 10/15/20 0422 10/16/20 0436  WBC 8.0 4.8 4.4 5.2 3.6*  NEUTROABS 6.5 3.8 3.5 3.4 2.2  HGB 12.2* 12.4* 11.2* 10.8* 12.3*  HCT 36.3* 38.3* 33.9* 33.1* 37.2*  MCV 94 96.7 96.0 96.2 94.9  PLT 173 120* 119* 103* 123*   Cardiac Enzymes: No results for input(s): CKTOTAL, CKMB, CKMBINDEX, TROPONINI in the last 168 hours. BNP: Invalid input(s): POCBNP CBG: No results for input(s): GLUCAP in the last 168 hours. D-Dimer No results for input(s): DDIMER in the last 72 hours. Hgb A1c No results for input(s): HGBA1C in the last 72 hours. Lipid Profile No results for input(s): CHOL, HDL, LDLCALC, TRIG, CHOLHDL, LDLDIRECT in the last 72 hours. Thyroid function studies No results for input(s): TSH, T4TOTAL, T3FREE, THYROIDAB in the last 72 hours.  Invalid input(s): FREET3 Anemia work up No results for input(s): VITAMINB12, FOLATE, FERRITIN, TIBC, IRON, RETICCTPCT in the last 72 hours. Urinalysis    Component Value Date/Time   COLORURINE AMBER (A) 10/13/2020 2255   APPEARANCEUR CLOUDY (A) 10/13/2020 2255   LABSPEC 1.024 10/13/2020 2255   PHURINE 5.0 10/13/2020 2255   GLUCOSEU NEGATIVE 10/13/2020 2255   HGBUR LARGE (A) 10/13/2020 2255   BILIRUBINUR NEGATIVE 10/13/2020 2255   BILIRUBINUR neg 10/12/2020 1132   KETONESUR 20 (A) 10/13/2020 2255   PROTEINUR 100 (A) 10/13/2020 2255   UROBILINOGEN 1.0 10/12/2020 1132   NITRITE NEGATIVE 10/13/2020 2255   LEUKOCYTESUR NEGATIVE 10/13/2020 2255   Sepsis Labs Invalid input(s): PROCALCITONIN,  WBC,  LACTICIDVEN Microbiology Recent Results (from the past 240 hour(s))  Urine Culture     Status: None   Collection Time: 10/12/20 11:55 AM   Specimen: Urine   UR  Result Value Ref Range Status   Urine Culture, Routine Final report  Final   Organism ID, Bacteria Comment  Final    Comment: Mixed urogenital flora 25,000-50,000 colony forming units per mL   Blood Culture (routine x 2)     Status: None (Preliminary result)   Collection  Time: 10/13/20 10:55 PM   Specimen: BLOOD  Result Value Ref Range Status   Specimen Description   Final    BLOOD LEFT ANTECUBITAL Performed at [facility], [place] [address], [place], [place] [address]    Special Requests   Final    BOTTLES DRAWN AEROBIC ONLY  Blood Culture results may not be optimal due to an inadequate volume of blood received in culture bottles Performed at [facility] [address], [place], [place] [address]    Culture   Final    NO GROWTH 2 DAYS Performed at [facility] [address] [place], [place] [address]    Report Status PENDING  Incomplete  Blood Culture (routine x 2)     Status: None (Preliminary result)   Collection Time: 10/13/20 10:55 PM   Specimen: BLOOD  Result Value Ref Range Status   Specimen Description   Final    BLOOD RIGHT ANTECUBITAL Performed at [facility] [address] [place], [place] [address]    Special Requests   Final    BOTTLES DRAWN AEROBIC AND ANAEROBIC Blood Culture adequate volume Performed at [facility] [address], [place], [place] [address]    Culture   Final    NO GROWTH 2 DAYS Performed at [facility] [address], [place], [place] [address]    Report Status PENDING  Incomplete  Urine culture     Status: None   Collection Time: 10/13/20 10:55 PM   Specimen: In/Out Cath Urine  Result Value Ref Range Status   Specimen Description   Final    IN/OUT CATH URINE Performed at [facility] [address] [place], [place] [address]    Special Requests   Final    NONE Performed at [facility], [place] [address], [place], [place] [address]    Culture   Final    NO GROWTH Performed at [facility], [place] [address], [place], [place] [address]    Report Status 10/14/2020 FINAL  Final  Resp Panel by RT PCR (RSV, Flu A&B, Covid) - Nasopharyngeal Swab     Status: None    Collection Time: 10/13/20 10:55 PM   Specimen: Nasopharyngeal Swab  Result Value Ref Range Status   SARS Coronavirus 2 by RT PCR NEGATIVE NEGATIVE Final    Comment: (NOTE) SARS-CoV-2 target nucleic acids are NOT DETECTED.  The SARS-CoV-2 RNA is generally detectable in upper respiratoy specimens during the acute phase of infection. The lowest concentration of SARS-CoV-2 viral copies this assay can detect is 131 copies/mL. A negative result does not preclude SARS-Cov-2 infection and should not be used as the sole basis for treatment or other patient management decisions. A negative result may occur with  improper specimen collection/handling, submission of specimen other than nasopharyngeal swab, presence of viral mutation(s) within the areas targeted by this assay, and inadequate number of viral copies (<131 copies/mL). A negative result must be combined with clinical observations, patient history, and epidemiological information. The expected result is Negative.  Fact Sheet for Patients:  PinkCheek.be  Fact Sheet for Healthcare Providers:  GravelBags.it  This test is no t yet approved or cleared by the [place] FDA and  has been authorized for detection and/or diagnosis of SARS-CoV-2 by FDA under an Emergency Use Authorization (EUA). This EUA will remain  in effect (meaning this test can be used) for the duration of the COVID-19 declaration under Section 564(b)(1) of the Act, 21 U.S.C. section 360bbb-3(b)(1), unless the authorization is terminated or revoked sooner.     Influenza A by PCR NEGATIVE NEGATIVE Final   Influenza B by PCR NEGATIVE NEGATIVE Final    Comment: (NOTE) The Xpert Xpress SARS-CoV-2/FLU/RSV assay is intended as an aid in  the  diagnosis of influenza from Nasopharyngeal swab specimens and  should not be used as a sole basis for treatment. Nasal washings and  aspirates are unacceptable for Xpert  Xpress SARS-CoV-2/FLU/RSV  testing.  Fact Sheet for Patients: PinkCheek.be  Fact Sheet for Healthcare Providers: GravelBags.it  This test is not yet approved or cleared by the [place] FDA and  has been authorized for detection and/or diagnosis of SARS-CoV-2 by  FDA under an Emergency Use Authorization (EUA). This EUA will remain  in effect (meaning this test can be used) for the duration of the  Covid-19 declaration under Section 564(b)(1) of the Act, 21  U.S.C. section 360bbb-3(b)(1), unless the authorization is  terminated or revoked.    Respiratory Syncytial Virus by PCR NEGATIVE NEGATIVE Final    Comment: (NOTE) Fact Sheet for Patients: PinkCheek.be  Fact Sheet for Healthcare Providers: GravelBags.it  This test is not yet approved or cleared by the [place] FDA and  has been authorized for detection and/or diagnosis of SARS-CoV-2 by  FDA under an Emergency Use Authorization (EUA). This EUA will remain  in effect (meaning this test can be used) for the duration of the  COVID-19 declaration under Section 564(b)(1) of the Act, 21 U.S.C.  section 360bbb-3(b)(1), unless the authorization is terminated or  revoked. Performed at [facility], [place] [address], [place], [place] [address]    Time spent: 30 min  SIGNED:   Marylu Lund, MD  [facility] 10/16/2020, 11:52 AM  If 7PM-7AM, please contact night-coverage

## 2020-10-19 LAB — CULTURE, BLOOD (ROUTINE X 2)
Culture: NO GROWTH
Culture: NO GROWTH
Special Requests: ADEQUATE

## 2020-10-20 ENCOUNTER — Ambulatory Visit (INDEPENDENT_AMBULATORY_CARE_PROVIDER_SITE_OTHER): Payer: PPO | Admitting: Family Medicine

## 2020-10-20 ENCOUNTER — Other Ambulatory Visit: Payer: Self-pay

## 2020-10-20 ENCOUNTER — Encounter: Payer: Self-pay | Admitting: Family Medicine

## 2020-10-20 VITALS — BP 116/70 | HR 66 | Temp 97.4°F | Ht 69.0 in | Wt 200.0 lb

## 2020-10-20 DIAGNOSIS — N3001 Acute cystitis with hematuria: Secondary | ICD-10-CM | POA: Diagnosis not present

## 2020-10-20 DIAGNOSIS — N3000 Acute cystitis without hematuria: Secondary | ICD-10-CM | POA: Insufficient documentation

## 2020-10-20 DIAGNOSIS — I10 Essential (primary) hypertension: Secondary | ICD-10-CM | POA: Diagnosis not present

## 2020-10-20 DIAGNOSIS — D494 Neoplasm of unspecified behavior of bladder: Secondary | ICD-10-CM

## 2020-10-20 DIAGNOSIS — I714 Abdominal aortic aneurysm, without rupture, unspecified: Secondary | ICD-10-CM

## 2020-10-20 DIAGNOSIS — M4727 Other spondylosis with radiculopathy, lumbosacral region: Secondary | ICD-10-CM

## 2020-10-20 NOTE — Patient Instructions (Signed)
Consider going back to therapy Continue amlodipine daily Check blood pressure daily Urology referral recommended Make an appointment with spine surgeon for follow up-concern by radiologist for spinal stenosis

## 2020-10-20 NOTE — Progress Notes (Signed)
Established Patient Office Visit  Subjective:  Patient ID: Kyle Torres, male    DOB: February 09, 1934  Age: 86 y.o. MRN: [id number]  CC:  Chief Complaint  Patient presents with  . Hospitalization Follow-up  concern for uro-sepsis-given rocephin in office, MRI prior to admission  HPI MARTIN BELLING presents for hospital follow up  Rash resolved over the weekend- pt felt likely rash was related to antibiotics  Nodule within urinary bladder-noted on CT-primary urothelial carcinoma. Pt given Rocephin IM then -Vantin. Urine cleared over the weekend-pale yellow. Pt drinking less coffee-more water.  Lumbar stenosis-05/28/20-lumbar fusion-walks with cane for balance. Pt with prior CVA.  Pt with rehab at hospital-stopped and started doing rehab at home.  Pt cleared after 15 weeks.  CT myelograph suggested  concern for moderate central canal stenosis at L3/4 Pt states he is taking prednisone on taper dose  HTN -pt stopped lisinopril-started amlodipine daily No concerns with the new medication  GERD-omeprazole BID-no concerns  Asthma-asmanex inhaler/fluticasone-well controlled  Prostate enlargement-avodart/flomax-nocturia 1-2 times/night  UTI-Vantin BID-2 additional days left-pt feels like drinking large amount of coffee caused UTI-very little water. Pt has increase water. Pt with no fever  CVA-no concerns currently        Past Medical History:  Diagnosis Date  . Arthritis   . Asthma   . BPH (benign prostatic hypertrophy)   . Essential hypertension, malignant 04/22/2020  . GERD (gastroesophageal reflux disease)   . Hiatal hernia neck pain and back pain   . Reflux   . Sleep apnea study was 50yrs ago at [facility]   . Stroke ([place]) 02/2018   rt hand has difficulty with fine movements.    Past Surgical History:  Procedure Laterality Date  . ANTERIOR CERVICAL DECOMP/DISCECTOMY FUSION  10/19/2011   Procedure: ANTERIOR CERVICAL DECOMPRESSION/DISCECTOMY FUSION 2 LEVELS;   Surgeon: Ophelia Charter;  Location: [facility];  Service: Neurosurgery;  Laterality: N/A;  CERVICAL FIVE-SIX, SIX-SEVEN ANTERIOR CERVICAL DECOMPRESSION WITH FUSION AND  INTERBODY PROTHESIS PLATING  . CHOLECYSTECTOMY  2010  . EYE SURGERY    . LUMBAR SPINE SURGERY  05/2020   L4-5 LAMINECTOMY AND FUSION W/ PEDICLE SCREWS, TLIF, POSSIBLE ALLOGRAFT & BONE MARROW ASPIRATION. [facility]  . RETINAL LASER PROCEDURE  1999   Torn retina, laser repair  . SHOULDER SURGERY  2000    Family History  Problem Relation Age of Onset  . Cirrhosis Mother   . Lung cancer Father   . Cancer Sister   . Coronary artery disease Neg Hx    Wife died august-CVA Social History   Socioeconomic History  . Marital status: Married    Spouse name: Not on file  . Number of children: Not on file  . Years of education: Not on file  . Highest education level: Not on file  Occupational History  . 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 Smoking status: Former Smoker    Quit date: 12/13/1975    Years since quitting: 44.8  . Smokeless tobacco: Former Systems developer    Quit date: 10/10/1983  Substance and Sexual Activity  . Alcohol use: No  . Drug use: No  . Sexual activity: Not on file  Other Topics Concern  . Not on file  Social History Narrative  . Not on file   Social Determinants of Health   Financial Resource Strain:   . Difficulty of Paying Living Expenses: Not on file  Food Insecurity:   .  Worried About Charity fundraiser in the Last Year: Not on file  . Ran Out of Food in the Last Year: Not on file  Transportation Needs:   . Lack of Transportation (Medical): Not on file  . Lack of Transportation (Non-Medical): Not on file  Physical Activity:   . Days of Exercise per Week: Not on file  . Minutes of Exercise per Session: Not on file  Stress:   . Feeling of Stress : Not on file  Social Connections:   . Frequency of Communication with Friends and Family:  Not on file  . Frequency of Social Gatherings with Friends and Family: Not on file  . Attends Religious Services: Not on file  . Active Member of Clubs or Organizations: Not on file  . Attends Archivist Meetings: Not on file  . Marital Status: Not on file  Intimate Partner Violence:   . Fear of Current or Ex-Partner: Not on file  . Emotionally Abused: Not on file  . Physically Abused: Not on file  . Sexually Abused: Not on file    Outpatient Medications Prior to Visit  Medication Sig Dispense Refill  . acetaminophen (TYLENOL) 500 MG tablet Take 1,000 mg by mouth every 6 (six) hours as needed for mild pain.    Marland Kitchen albuterol (PROVENTIL HFA;VENTOLIN HFA) 108 (90 BASE) MCG/ACT inhaler Inhale 1 puff into the lungs every 6 (six) hours as needed. FOR SHORTNESS OF BREATH    . amLODipine (NORVASC) 5 MG tablet Take 1 tablet (5 mg total) by mouth daily. 30 tablet 0  . aspirin EC 81 MG tablet Take 1 tablet (81 mg total) by mouth daily. 90 tablet 3  . finasteride (PROSCAR) 5 MG tablet Take 5 mg by mouth daily.     Marland Kitchen ibuprofen (ADVIL) 200 MG tablet Take 200 mg by mouth every 6 (six) hours as needed for moderate pain.    . mometasone (ASMANEX) 220 MCG/INH inhaler Inhale 1 puff into the lungs 2 (two) times daily.     . Multiple Vitamin (MULTIVITAMIN) tablet Take 1 tablet by mouth daily.     Marland Kitchen omeprazole (PRILOSEC) 20 MG capsule Take 20 mg by mouth 2 (two) times daily.     . predniSONE (DELTASONE) 10 MG tablet Take 4 tablets (40 mg total) by mouth daily with breakfast for 2 days, THEN 2 tablets (20 mg total) daily with breakfast for 2 days, THEN 1 tablet (10 mg total) daily with breakfast for 2 days, THEN 0.5 tablets (5 mg total) daily with breakfast for 2 days. 15 tablet 0  . terazosin (HYTRIN) 5 MG capsule Take 5 mg by mouth 2 (two) times daily.       No facility-administered medications prior to visit.    No Known Allergies  ROS Review of Systems  Respiratory: Negative.     Cardiovascular: Negative.   Gastrointestinal: Positive for abdominal pain.  Genitourinary:       Bladder cancer with uro-sepsis  Musculoskeletal: Positive for back pain.  Skin: Positive for rash.      Objective:     Today's Vitals   10/20/20 1420  BP: 116/70  Pulse: 66  Temp: (!) 97.4 F (36.3 C)  TempSrc: Temporal  SpO2: 97%  Weight: 200 lb (90.7 kg)  Height: 5\' 9"  (1.753 m)   Body mass index is 29.53 kg/m. Physical Exam Constitutional:      Appearance: Normal appearance.  HENT:     Head: Normocephalic and atraumatic.  Eyes:  Conjunctiva/sclera: Conjunctivae normal.  Cardiovascular:     Rate and Rhythm: Normal rate and regular rhythm.     Pulses: Normal pulses.     Heart sounds: Normal heart sounds.  Chest:     Chest wall: Tenderness present.  Musculoskeletal:     Right lower leg: Edema present.     Left lower leg: Edema present.  Neurological:     Mental Status: He is alert and oriented to person, place, and time.  Psychiatric:        Mood and Affect: Mood normal.        Behavior: Behavior normal.     BP 116/70 (BP Location: Left Arm, Patient Position: Sitting, Cuff Size: Normal)   Pulse 66   Temp (!) 97.4 F (36.3 C) (Temporal)   Ht 5\' 9"  (1.753 m)   Wt 200 lb (90.7 kg)   SpO2 97%   BMI 29.53 kg/m  Wt Readings from Last 3 Encounters:  10/20/20 200 lb (90.7 kg)  10/13/20 200 lb (90.7 kg)  10/12/20 206 lb (93.4 kg)     Health Maintenance Due  Topic Date Due  . TETANUS/TDAP  Never done    Lab Results  Component Value Date   TSH 1.900 05/19/2020   Lab Results  Component Value Date   WBC 3.6 (L) 10/16/2020   HGB 12.3 (L) 10/16/2020   HCT 37.2 (L) 10/16/2020   MCV 94.9 10/16/2020   PLT 123 (L) 10/16/2020   Lab Results  Component Value Date   NA 138 10/16/2020   K 4.5 10/16/2020   CO2 24 10/16/2020   GLUCOSE 146 (H) 10/16/2020   BUN 16 10/16/2020   CREATININE 0.83 10/16/2020   BILITOT 0.3 10/16/2020   ALKPHOS 50 10/16/2020    AST 30 10/16/2020   ALT 28 10/16/2020   PROT 6.6 10/16/2020   ALBUMIN 3.0 (L) 10/16/2020   CALCIUM 8.7 (L) 10/16/2020   ANIONGAP 9 10/16/2020   Lab Results  Component Value Date   CHOL 127 08/24/2020   Lab Results  Component Value Date   HDL 50 08/24/2020   Lab Results  Component Value Date   LDLCALC 62 08/24/2020   Lab Results  Component Value Date   TRIG 73 08/24/2020   Lab Results  Component Value Date   CHOLHDL 2.5 08/24/2020     Assessment & Plan:  1. Bladder neoplasm Limit caffeine -increase water - Ambulatory referral to Urology  2. Hematuria due to acute cystitis Continue water-urology referral-may need cystoscopy  3. Essential hypertension, malignant Amlodipine  5mg  daily, check bp with home monitor  4. Abdominal aortic aneurysm (AAA) without rupture ([place]) Follow up q 3 years recommended 5. Lumbosacral radiculopathy due to degenerative joint disease of spine Consider going back to therapy-schedule f/u exam with back surgeon Follow-up: spine surgery   Aveleen Nevers Hannah Beat, MD

## 2020-11-10 ENCOUNTER — Ambulatory Visit (INDEPENDENT_AMBULATORY_CARE_PROVIDER_SITE_OTHER): Payer: PPO

## 2020-11-10 ENCOUNTER — Other Ambulatory Visit: Payer: Self-pay

## 2020-11-10 DIAGNOSIS — Z23 Encounter for immunization: Secondary | ICD-10-CM | POA: Diagnosis not present

## 2020-11-10 NOTE — Progress Notes (Signed)
   Covid-19 Vaccination Clinic  Name:  PHILIPE LASWELL    MRN: [id number] DOB: 19-Dec-1933  11/10/2020  Mr. Bilyeu was observed post Covid-19 immunization for 15 minutes without incident. He was provided with Vaccine Information Sheet and instruction to access the V-Safe system.   Mr. Gerst was instructed to call 911 with any severe reactions post vaccine: Marland Kitchen Difficulty breathing  . Swelling of face and throat  . A fast heartbeat  . A bad rash all over body  . Dizziness and weakness

## 2020-11-11 ENCOUNTER — Other Ambulatory Visit: Payer: Self-pay

## 2020-11-13 MED ORDER — AMLODIPINE BESYLATE 5 MG PO TABS: 5.0000 mg | ORAL_TABLET | Freq: Every day | ORAL | 0 refills | Status: DC

## 2020-11-26 DIAGNOSIS — Z79899 Other long term (current) drug therapy: Secondary | ICD-10-CM | POA: Diagnosis not present

## 2020-11-26 DIAGNOSIS — N401 Enlarged prostate with lower urinary tract symptoms: Secondary | ICD-10-CM | POA: Diagnosis not present

## 2020-11-26 DIAGNOSIS — D414 Neoplasm of uncertain behavior of bladder: Secondary | ICD-10-CM | POA: Diagnosis not present

## 2020-12-09 ENCOUNTER — Other Ambulatory Visit: Payer: Self-pay | Admitting: Family Medicine

## 2021-01-07 DIAGNOSIS — N401 Enlarged prostate with lower urinary tract symptoms: Secondary | ICD-10-CM | POA: Diagnosis not present

## 2021-01-07 DIAGNOSIS — D414 Neoplasm of uncertain behavior of bladder: Secondary | ICD-10-CM | POA: Diagnosis not present

## 2021-01-12 ENCOUNTER — Other Ambulatory Visit: Payer: Self-pay | Admitting: Family Medicine

## 2021-02-02 NOTE — Progress Notes (Addendum)
Subjective:  Patient ID: Kyle Torres, male    DOB: 1934-06-15  Age: 87 y.o. MRN: [id number]  Chief Complaint  Patient presents with  . Hyperlipidemia    HPI  Mixed hyperlipidemia: Patient is eating healthy.  Essential hypertension Patient is taking, aspirin 81 mg daily, amlodipine 5 mg daily. At home b/p readings range 114-145's/57-81's  Abdominal aortic aneurysm (AAA) without rupture: 3 cm.  Last checked in 10/2021.   Ataxia, post-stroke: Balance is still poor, but improved. Uses a cane.    OSA (obstructive sleep apnea)  Uses CPAP.   Atherosclerosis of Aorta - ASA 81  Current Outpatient Medications on File Prior to Visit  Medication Sig Dispense Refill  . fluticasone (FLONASE) 50 MCG/ACT nasal spray Place into both nostrils daily.    Marland Kitchen albuterol (PROVENTIL HFA;VENTOLIN HFA) 108 (90 BASE) MCG/ACT inhaler Inhale 1 puff into the lungs every 6 (six) hours as needed. FOR SHORTNESS OF BREATH    . amLODipine (NORVASC) 5 MG tablet TAKE ONE (1) TABLET ONCE DAILY 90 tablet 0  . aspirin EC 81 MG tablet Take 1 tablet (81 mg total) by mouth daily. 90 tablet 3  . finasteride (PROSCAR) 5 MG tablet Take 5 mg by mouth daily.     . mometasone (ASMANEX) 220 MCG/INH inhaler Inhale 1 puff into the lungs 2 (two) times daily.     . Multiple Vitamin (MULTIVITAMIN) tablet Take 1 tablet by mouth daily.     Marland Kitchen omeprazole (PRILOSEC) 20 MG capsule Take 20 mg by mouth 2 (two) times daily.     Marland Kitchen terazosin (HYTRIN) 5 MG capsule Take 5 mg by mouth 2 (two) times daily.       No current facility-administered medications on file prior to visit.   Past Medical History:  Diagnosis Date  . Arthritis   . Asthma   . BPH (benign prostatic hypertrophy)   . Essential hypertension, malignant 04/22/2020  . GERD (gastroesophageal reflux disease)   . Hiatal hernia neck pain and back pain   . Reflux   . Sleep apnea study was 43yrs ago at [facility]   . Stroke ([place]) 02/2018   rt hand has difficulty with fine  movements.   Past Surgical History:  Procedure Laterality Date  . ANTERIOR CERVICAL DECOMP/DISCECTOMY FUSION  10/19/2011   Procedure: ANTERIOR CERVICAL DECOMPRESSION/DISCECTOMY FUSION 2 LEVELS;  Surgeon: Ophelia Charter;  Location: [facility];  Service: Neurosurgery;  Laterality: N/A;  CERVICAL FIVE-SIX, SIX-SEVEN ANTERIOR CERVICAL DECOMPRESSION WITH FUSION AND  INTERBODY PROTHESIS PLATING  . CHOLECYSTECTOMY  2010  . EYE SURGERY    . LUMBAR SPINE SURGERY  05/2020   L4-5 LAMINECTOMY AND FUSION W/ PEDICLE SCREWS, TLIF, POSSIBLE ALLOGRAFT & BONE MARROW ASPIRATION. [facility]  . RETINAL LASER PROCEDURE  1999   Torn retina, laser repair  . SHOULDER SURGERY  2000    Family History  Problem Relation Age of Onset  . Cirrhosis Mother   . Lung cancer Father   . Cancer Sister   . Coronary artery disease Neg Hx    Social History   Socioeconomic History  . Marital status: Married    Spouse name: Not on file  . Number of children: Not on file  . Years of education: Not on file  . Highest education level: Not on file  Occupational History  . 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 Smoking status: Former Smoker    Quit date: 12/13/1975  Years since quitting: 45.1  . Smokeless tobacco: Former Systems developer    Quit date: 10/10/1983  Substance and Sexual Activity  . Alcohol use: No  . Drug use: No  . Sexual activity: Not on file  Other Topics Concern  . Not on file  Social History Narrative  . Not on file   Social Determinants of Health   Financial Resource Strain: Not on file  Food Insecurity: Not on file  Transportation Needs: Not on file  Physical Activity: Not on file  Stress: Not on file  Social Connections: Not on file    Review of Systems  Constitutional: Negative for chills, fatigue, fever and unexpected weight change.  HENT: Negative for congestion, ear pain, sinus pain and sore throat.   Cardiovascular: Negative for chest  pain and palpitations.  Gastrointestinal: Negative for abdominal pain, blood in stool, constipation, diarrhea, nausea and vomiting.  Endocrine: Negative for polydipsia.  Genitourinary: Negative for dysuria.  Musculoskeletal: Negative for back pain.  Skin: Negative for rash.  Neurological: Negative for headaches.     Objective:  BP (!) 118/58   Pulse 98   Temp (!) 96.3 F (35.7 C)   Ht 5\' 10"  (1.778 m)   Wt 201 lb (91.2 kg)   SpO2 98%   BMI 28.84 kg/m   BP/Weight 02/03/2021 10/20/2020 62/02/7627  Systolic BP 315 176 160  Diastolic BP 58 70 89  Wt. (Lbs) 201 200 -  BMI 28.84 29.53 -    Physical Exam Constitutional:      Appearance: Normal appearance.  Cardiovascular:     Rate and Rhythm: Normal rate and regular rhythm.     Heart sounds: Normal heart sounds.  Pulmonary:     Effort: Pulmonary effort is normal.     Breath sounds: Normal breath sounds.  Abdominal:     General: Bowel sounds are normal.     Palpations: Abdomen is soft. There is no mass.     Tenderness: There is no abdominal tenderness.  Neurological:     Mental Status: He is alert.  Psychiatric:        Mood and Affect: Mood normal.        Behavior: Behavior normal.        Thought Content: Thought content normal.    Lab Results  Component Value Date   WBC 3.6 (L) 10/16/2020   HGB 12.3 (L) 10/16/2020   HCT 37.2 (L) 10/16/2020   PLT 123 (L) 10/16/2020   GLUCOSE 146 (H) 10/16/2020   CHOL 127 08/24/2020   TRIG 73 08/24/2020   HDL 50 08/24/2020   LDLCALC 62 08/24/2020   ALT 28 10/16/2020   AST 30 10/16/2020   NA 138 10/16/2020   K 4.5 10/16/2020   CL 105 10/16/2020   CREATININE 0.83 10/16/2020   BUN 16 10/16/2020   CO2 24 10/16/2020   TSH 1.900 05/19/2020   INR 1.2 10/13/2020      Assessment & Plan:   1. Mixed hyperlipidemia Recommend statin.  Low fat diet and exercise.  - Lipid panel - TSH  2. Lumbosacral radiculopathy Resolved with surgery.   3. Essential hypertension Well  controlled.  No changes to medicines.  Continue to work on eating a healthy diet and exercise.  Labs drawn today.  - Comprehensive metabolic panel - CBC with Differential/Platelet  4. Abdominal aortic aneurysm (AAA) without rupture ([place]) Abdominal US every 3 years.   5. Ataxia, post-stroke Continue aspirin 81 mg daily.  Recommend statin.  6. OSA (obstructive sleep  apnea) Continue to use cpap.  7. Atherosclerosis of aorta ([facility])  Continue aspirin 81 mg daily and recommend statin.  Orders Placed This Encounter  Procedures  . Comprehensive metabolic panel  . Lipid panel  . TSH  . CBC with Differential/Platelet    Follow-up: Return in about 3 months (around 05/03/2021).  An After Visit Summary was printed and given to the patient.  Rochel Brome, MD [facility] [phone]

## 2021-02-03 ENCOUNTER — Encounter: Payer: Self-pay | Admitting: Family Medicine

## 2021-02-03 ENCOUNTER — Ambulatory Visit (INDEPENDENT_AMBULATORY_CARE_PROVIDER_SITE_OTHER): Payer: PPO | Admitting: Family Medicine

## 2021-02-03 ENCOUNTER — Other Ambulatory Visit: Payer: Self-pay

## 2021-02-03 VITALS — BP 118/58 | HR 98 | Temp 96.3°F | Ht 70.0 in | Wt 201.0 lb

## 2021-02-03 DIAGNOSIS — I714 Abdominal aortic aneurysm, without rupture, unspecified: Secondary | ICD-10-CM

## 2021-02-03 DIAGNOSIS — M5417 Radiculopathy, lumbosacral region: Secondary | ICD-10-CM | POA: Diagnosis not present

## 2021-02-03 DIAGNOSIS — G4733 Obstructive sleep apnea (adult) (pediatric): Secondary | ICD-10-CM

## 2021-02-03 DIAGNOSIS — I10 Essential (primary) hypertension: Secondary | ICD-10-CM

## 2021-02-03 DIAGNOSIS — I700 Atherosclerosis of aorta: Secondary | ICD-10-CM | POA: Diagnosis not present

## 2021-02-03 DIAGNOSIS — E782 Mixed hyperlipidemia: Secondary | ICD-10-CM | POA: Diagnosis not present

## 2021-02-03 DIAGNOSIS — I69393 Ataxia following cerebral infarction: Secondary | ICD-10-CM | POA: Diagnosis not present

## 2021-02-04 LAB — COMPREHENSIVE METABOLIC PANEL
ALT: 15 IU/L (ref 0–44)
AST: 20 IU/L (ref 0–40)
Albumin/Globulin Ratio: 1.4 (ref 1.2–2.2)
Albumin: 4 g/dL (ref 3.6–4.6)
Alkaline Phosphatase: 60 IU/L (ref 44–121)
BUN/Creatinine Ratio: 19 (ref 10–24)
BUN: 16 mg/dL (ref 8–27)
Bilirubin Total: 0.4 mg/dL (ref 0.0–1.2)
CO2: 26 mmol/L (ref 20–29)
Calcium: 9.3 mg/dL (ref 8.6–10.2)
Chloride: 103 mmol/L (ref 96–106)
Creatinine, Ser: 0.84 mg/dL (ref 0.76–1.27)
GFR calc Af Amer: 93 mL/min/{1.73_m2} (ref >59)
GFR calc non Af Amer: 80 mL/min/{1.73_m2} (ref >59)
Globulin, Total: 2.9 g/dL (ref 1.5–4.5)
Glucose: 82 mg/dL (ref 65–99)
Potassium: 4.6 mmol/L (ref 3.5–5.2)
Sodium: 142 mmol/L (ref 134–144)
Total Protein: 6.9 g/dL (ref 6.0–8.5)

## 2021-02-04 LAB — CBC WITH DIFFERENTIAL/PLATELET
Basophils Absolute: 0.1 10*3/uL (ref 0.0–0.2)
Basos: 1 %
EOS (ABSOLUTE): 0.3 10*3/uL (ref 0.0–0.4)
Eos: 3 %
Hematocrit: 37.6 % (ref 37.5–51.0)
Hemoglobin: 12.5 g/dL — ABNORMAL LOW (ref 13.0–17.7)
Immature Grans (Abs): 0 10*3/uL (ref 0.0–0.1)
Immature Granulocytes: 0 %
Lymphocytes Absolute: 2.7 10*3/uL (ref 0.7–3.1)
Lymphs: 33 %
MCH: 31.5 pg (ref 26.6–33.0)
MCHC: 33.2 g/dL (ref 31.5–35.7)
MCV: 95 fL (ref 79–97)
Monocytes Absolute: 0.7 10*3/uL (ref 0.1–0.9)
Monocytes: 9 %
Neutrophils Absolute: 4.4 10*3/uL (ref 1.4–7.0)
Neutrophils: 54 %
Platelets: 254 10*3/uL (ref 150–450)
RBC: 3.97 x10E6/uL — ABNORMAL LOW (ref 4.14–5.80)
RDW: 12.7 % (ref 11.6–15.4)
WBC: 8.2 10*3/uL (ref 3.4–10.8)

## 2021-02-04 LAB — TSH: TSH: 1.63 u[IU]/mL (ref 0.450–4.500)

## 2021-02-04 LAB — LIPID PANEL
Chol/HDL Ratio: 2.5 ratio (ref 0.0–5.0)
Cholesterol, Total: 153 mg/dL (ref 100–199)
HDL: 61 mg/dL (ref >39)
LDL Chol Calc (NIH): 82 mg/dL (ref 0–99)
Triglycerides: 45 mg/dL (ref 0–149)
VLDL Cholesterol Cal: 10 mg/dL (ref 5–40)

## 2021-02-04 LAB — CARDIOVASCULAR RISK ASSESSMENT

## 2021-02-08 ENCOUNTER — Telehealth: Payer: Self-pay

## 2021-02-08 DIAGNOSIS — D414 Neoplasm of uncertain behavior of bladder: Secondary | ICD-10-CM | POA: Diagnosis not present

## 2021-02-08 DIAGNOSIS — Z79899 Other long term (current) drug therapy: Secondary | ICD-10-CM | POA: Diagnosis not present

## 2021-02-08 DIAGNOSIS — N401 Enlarged prostate with lower urinary tract symptoms: Secondary | ICD-10-CM | POA: Diagnosis not present

## 2021-02-08 NOTE — Telephone Encounter (Signed)
-----   Message from Rochel Brome, MD sent at 02/07/2021 10:53 PM EST ----- Regarding: Cholesterol medicine recommended. I was reviewing patient's chart and with his history of a stroke and his cholesterol build up in aorta.  Recommend crestor 20 mg once daily.  Continue aspirin 81 mg daily.  kc

## 2021-02-08 NOTE — Telephone Encounter (Signed)
Attempted to call pt. No answer, left VM for call back to clinic.   Royce Macadamia, [place] 02/08/21 8:08 AM

## 2021-02-11 ENCOUNTER — Other Ambulatory Visit: Payer: Self-pay

## 2021-02-11 MED ORDER — ROSUVASTATIN CALCIUM 20 MG PO TABS: 20.0000 mg | ORAL_TABLET | Freq: Every day | ORAL | 0 refills | Status: DC

## 2021-02-11 NOTE — Telephone Encounter (Signed)
Called pt. Pt VU and agreed to medication. Sent medication to [facility].  Royce Macadamia, [place] 02/11/21 9:12 AM

## 2021-02-22 ENCOUNTER — Encounter: Payer: Self-pay | Admitting: Nurse Practitioner

## 2021-02-22 ENCOUNTER — Other Ambulatory Visit: Payer: Self-pay

## 2021-02-22 ENCOUNTER — Ambulatory Visit (INDEPENDENT_AMBULATORY_CARE_PROVIDER_SITE_OTHER): Payer: PPO | Admitting: Nurse Practitioner

## 2021-02-22 VITALS — BP 128/68 | HR 65 | Temp 97.9°F | Ht 70.0 in | Wt 207.0 lb

## 2021-02-22 DIAGNOSIS — Z8673 Personal history of transient ischemic attack (TIA), and cerebral infarction without residual deficits: Secondary | ICD-10-CM

## 2021-02-22 DIAGNOSIS — I10 Essential (primary) hypertension: Secondary | ICD-10-CM | POA: Diagnosis not present

## 2021-02-22 DIAGNOSIS — R809 Proteinuria, unspecified: Secondary | ICD-10-CM | POA: Diagnosis not present

## 2021-02-22 DIAGNOSIS — J4520 Mild intermittent asthma, uncomplicated: Secondary | ICD-10-CM

## 2021-02-22 DIAGNOSIS — Z01818 Encounter for other preprocedural examination: Secondary | ICD-10-CM

## 2021-02-22 DIAGNOSIS — Z6829 Body mass index (BMI) 29.0-29.9, adult: Secondary | ICD-10-CM | POA: Diagnosis not present

## 2021-02-22 DIAGNOSIS — N329 Bladder disorder, unspecified: Secondary | ICD-10-CM

## 2021-02-22 DIAGNOSIS — G4733 Obstructive sleep apnea (adult) (pediatric): Secondary | ICD-10-CM | POA: Diagnosis not present

## 2021-02-22 LAB — CBC WITH DIFFERENTIAL/PLATELET
Basophils Absolute: 0 10*3/uL (ref 0.0–0.2)
Basos: 1 %
EOS (ABSOLUTE): 0.3 10*3/uL (ref 0.0–0.4)
Eos: 4 %
Hematocrit: 35.9 % — ABNORMAL LOW (ref 37.5–51.0)
Hemoglobin: 12 g/dL — ABNORMAL LOW (ref 13.0–17.7)
Immature Grans (Abs): 0 10*3/uL (ref 0.0–0.1)
Immature Granulocytes: 0 %
Lymphocytes Absolute: 2.3 10*3/uL (ref 0.7–3.1)
Lymphs: 30 %
MCH: 31.7 pg (ref 26.6–33.0)
MCHC: 33.4 g/dL (ref 31.5–35.7)
MCV: 95 fL (ref 79–97)
Monocytes Absolute: 0.7 10*3/uL (ref 0.1–0.9)
Monocytes: 9 %
Neutrophils Absolute: 4.4 10*3/uL (ref 1.4–7.0)
Neutrophils: 56 %
Platelets: 246 10*3/uL (ref 150–450)
RBC: 3.78 x10E6/uL — ABNORMAL LOW (ref 4.14–5.80)
RDW: 12.6 % (ref 11.6–15.4)
WBC: 7.7 10*3/uL (ref 3.4–10.8)

## 2021-02-22 LAB — POCT URINALYSIS DIPSTICK
Bilirubin, UA: NEGATIVE
Glucose, UA: NEGATIVE
Ketones, UA: NEGATIVE
Leukocytes, UA: NEGATIVE
Nitrite, UA: NEGATIVE
Protein, UA: POSITIVE — AB
Spec Grav, UA: 1.02 (ref 1.010–1.025)
Urobilinogen, UA: NEGATIVE E.U./dL — AB
pH, UA: 7 (ref 5.0–8.0)

## 2021-02-22 NOTE — Progress Notes (Signed)
Established Patient Office Visit  Subjective:  Patient ID: Kyle Torres, male    DOB: [birth year]  Age: 87 y.o. MRN: [id number]  CC: Bladder lesion  HPI Kyle Torres is an 87 year old that presents for pre-operative surgical evaluation for cystoscopy with transurethral resection of bladder tumor. He has chronic medical history of hypertension, hyperlipidemia, asthma, and obstructive sleep apnea. He has a previous history of CVA in 2019 without deficits. He tells me he had spinal surgery with hardware inserted in June 2021 that has left him with bilateral lower extremity weakness. He ambulates with a cane. He has been a widower for approximately  85-months. He lives alone and is independent with all ADLs. He tells me that he did experience two falls without injury while doing yard work approximately 4-weeks ago. He states his adult-step-daughter is a Therapist, sports that will help care for him post-operatively.He wears bilateral hearing aids, has difficulty communicating without them. He has obtained three COVID-19 vaccines and seasonal flu immunizations. He denies recent coughs, colds, or recent exposure to known ill-contacts. States he rarely goes out to eat in public.  Hypertension Kyle Torres has a history of hypertension since 2018. Current treatment includes Norvasc 5 mg daily. BP 128/68 today in office. Last visit for f/u on BP was 02/03/21, BP 118/58. He denies chest pain, dyspnea, headaches, or dizziness. He is adherent to medication regimen and follow-up appointments. He does have a history of infrarenal 3cm AAA. Last evaluated 10/2020, stable.  Hyperlipidemia  Kyle Torres has a history of hyperlipidemia for several years. Current treatment includes Crestor 20 mg daily. Lipid panel on 02/03/21 reveals well-controlled TC 152, Trig 45, HDL 61, and LDL 82. He denies myalgias or arthralgias related to statin therapy.   Asthma Kyle Torres has a history of asthma for several years. Current treatments include  Albuterol inhaler as needed. Pt denies any recent exacerbations, dyspnea, chest tightness, or wheezing. States symptoms are well-controlled.  Medical record review reveals 30-year cigarette smoking history, states he quit approximately 1980.   Obstructive sleep apnea Kyle Torres has a history of obstructive sleep apnea. States he is intermittent with CPAP machine. He tells me that his CPAP machine company no longer manufactures full face masks that fit his machine. States he has been using some old masks he ordered several months ago.     Past Medical History:  Diagnosis Date  . Arthritis   . Asthma   . BPH (benign prostatic hypertrophy)   . Essential hypertension, malignant 04/22/2020  . GERD (gastroesophageal reflux disease)   . Hiatal hernia neck pain and back pain   . Reflux   . Sleep apnea study was 38yrs ago at [facility]   . Stroke ([place]) 02/2018   rt hand has difficulty with fine movements.    Past Surgical History:  Procedure Laterality Date  . ANTERIOR CERVICAL DECOMP/DISCECTOMY FUSION  10/19/2011   Procedure: ANTERIOR CERVICAL DECOMPRESSION/DISCECTOMY FUSION 2 LEVELS;  Surgeon: Ophelia Charter;  Location: [facility];  Service: Neurosurgery;  Laterality: N/A;  CERVICAL FIVE-SIX, SIX-SEVEN ANTERIOR CERVICAL DECOMPRESSION WITH FUSION AND  INTERBODY PROTHESIS PLATING  . CHOLECYSTECTOMY  2010  . EYE SURGERY    . LUMBAR SPINE SURGERY  05/2020   L4-5 LAMINECTOMY AND FUSION W/ PEDICLE SCREWS, TLIF, POSSIBLE ALLOGRAFT & BONE MARROW ASPIRATION. [facility]  . RETINAL LASER PROCEDURE  1999   Torn retina, laser repair  . SHOULDER SURGERY  2000    Family History  Problem Relation Age of Onset  . Cirrhosis Mother   .  Lung cancer Father   . Cancer Sister   . Coronary artery disease Neg Hx     Social History   Socioeconomic History  . Marital status: Married    Spouse name: Not on file  . Number of children: Not on file  . Years of education: Not on file  . Highest  education level: Not on file  Occupational History  . 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 Smoking status: Former Smoker    Quit date: 12/13/1975    Years since quitting: 45.2  . Smokeless tobacco: Former Systems developer    Quit date: 10/10/1983  Substance and Sexual Activity  . Alcohol use: No  . Drug use: No  . Sexual activity: Not on file  Other Topics Concern  . Not on file  Social History Narrative  . Not on file   Social Determinants of Health   Financial Resource Strain: Not on file  Food Insecurity: Not on file  Transportation Needs: Not on file  Physical Activity: Not on file  Stress: Not on file  Social Connections: Not on file  Intimate Partner Violence: Not on file    Outpatient Medications Prior to Visit  Medication Sig Dispense Refill  . albuterol (PROVENTIL HFA;VENTOLIN HFA) 108 (90 BASE) MCG/ACT inhaler Inhale 1 puff into the lungs every 6 (six) hours as needed. FOR SHORTNESS OF BREATH    . amLODipine (NORVASC) 5 MG tablet TAKE ONE (1) TABLET ONCE DAILY 90 tablet 0  . aspirin EC 81 MG tablet Take 1 tablet (81 mg total) by mouth daily. 90 tablet 3  . finasteride (PROSCAR) 5 MG tablet Take 5 mg by mouth daily.    . fluticasone (FLONASE) 50 MCG/ACT nasal spray Place into both nostrils daily.    . mometasone (ASMANEX) 220 MCG/INH inhaler Inhale 1 puff into the lungs 2 (two) times daily.    . Multiple Vitamin (MULTIVITAMIN) tablet Take 1 tablet by mouth daily.    Marland Kitchen omeprazole (PRILOSEC) 20 MG capsule Take 20 mg by mouth 2 (two) times daily.    . rosuvastatin (CRESTOR) 20 MG tablet Take 1 tablet (20 mg total) by mouth daily. 30 tablet 0  . terazosin (HYTRIN) 5 MG capsule Take 5 mg by mouth 2 (two) times daily.     No facility-administered medications prior to visit.    No Known Allergies  ROS Review of Systems  Constitutional: Positive for fatigue. Negative for appetite change and unexpected weight change.  HENT:  Positive for hearing loss (bilateral hearing aids). Negative for congestion, ear pain, rhinorrhea, sinus pressure, sinus pain and tinnitus.   Eyes: Negative for pain.  Respiratory: Negative for cough and shortness of breath.   Cardiovascular: Negative for chest pain, palpitations and leg swelling.  Gastrointestinal: Negative for abdominal pain, constipation, diarrhea, nausea and vomiting.  Endocrine: Negative for cold intolerance, heat intolerance, polydipsia, polyphagia and polyuria.  Genitourinary: Negative for dysuria, frequency and hematuria.  Musculoskeletal: Negative for arthralgias, back pain, joint swelling and myalgias.  Skin: Negative for rash.  Allergic/Immunologic: Negative for environmental allergies.  Neurological: Positive for weakness (bilateral lower extremities). Negative for dizziness and headaches.       Off-balance   Hematological: Negative for adenopathy.  Psychiatric/Behavioral: Negative for decreased concentration and sleep disturbance. The patient is not nervous/anxious.       Objective:    Physical Exam Vitals reviewed.  Constitutional:      Appearance: Normal appearance.  HENT:  Head: Normocephalic.     Right Ear: Tympanic membrane normal.     Left Ear: Tympanic membrane normal.     Ears:     Comments: Bilateral hearing aids    Nose: Nose normal.     Mouth/Throat:     Mouth: Mucous membranes are moist.  Eyes:     Comments: Eyeglasses in place  Cardiovascular:     Rate and Rhythm: Normal rate and regular rhythm.     Pulses: Normal pulses.     Heart sounds: Normal heart sounds.  Pulmonary:     Effort: Pulmonary effort is normal.     Breath sounds: Normal breath sounds.  Abdominal:     General: Bowel sounds are normal.     Palpations: Abdomen is soft.  Musculoskeletal:        General: Normal range of motion.  Skin:    General: Skin is warm and dry.     Capillary Refill: Capillary refill takes less than 2 seconds.  Neurological:     General:  No focal deficit present.     Mental Status: He is alert and oriented to person, place, and time.  Psychiatric:        Mood and Affect: Mood normal.        Behavior: Behavior normal.     BP 128/68 (BP Location: Left Arm, Patient Position: Sitting)   Pulse 65   Temp 97.9 F (36.6 C) (Temporal)   Ht 5\' 10"  (1.778 m)   Wt 207 lb (93.9 kg)   SpO2 97%   BMI 29.70 kg/m  Wt Readings from Last 3 Encounters:  02/22/21 207 lb (93.9 kg)  02/03/21 201 lb (91.2 kg)  10/20/20 200 lb (90.7 kg)      Lab Results  Component Value Date   TSH 1.630 02/03/2021   Lab Results  Component Value Date   WBC 8.2 02/03/2021   HGB 12.5 (L) 02/03/2021   HCT 37.6 02/03/2021   MCV 95 02/03/2021   PLT 254 02/03/2021   Lab Results  Component Value Date   NA 142 02/03/2021   K 4.6 02/03/2021   CO2 26 02/03/2021   GLUCOSE 82 02/03/2021   BUN 16 02/03/2021   CREATININE 0.84 02/03/2021   BILITOT 0.4 02/03/2021   ALKPHOS 60 02/03/2021   AST 20 02/03/2021   ALT 15 02/03/2021   PROT 6.9 02/03/2021   ALBUMIN 4.0 02/03/2021   CALCIUM 9.3 02/03/2021   ANIONGAP 9 10/16/2020   Lab Results  Component Value Date   CHOL 153 02/03/2021   Lab Results  Component Value Date   HDL 61 02/03/2021   Lab Results  Component Value Date   LDLCALC 82 02/03/2021   Lab Results  Component Value Date   TRIG 45 02/03/2021   Lab Results  Component Value Date   CHOLHDL 2.5 02/03/2021       Assessment & Plan:   1. Preoperative evaluation to rule out surgical contraindication - CBC with Differential/Platelet - EKG 12-Lead - POCT urinalysis dipstick -CXR at [facility] today -Recent labs on 02/03/21 faxed to Dr Ara Kussmaul office -Pt to obtain COVID-19 screening test 72 hours prior to surgery date  2. Essential hypertension-well controlled -Continue Norvasc 5 mg daily -DASH diet  3. OSA (obstructive sleep apnea) -CPAP machine nightly  4. Mild intermittent asthma without complication-well  controlled - Continue Albuterol inhaler as needed  5. History of CVA (cerebrovascular accident) -Continue Norvasc 5 mg daily  6. Lesion of urinary bladder -TURB as  scheduled with Dr Nila Nephew  7. BMI 29.7 -Heart healthy diet -Continue physical activity  8. Proteinuria, unspecified -Repeat UA post-operatively  Continue medications as prescribed We will call you with lab results and fax them to Dr Nila Nephew Get chest x-[facility] at [facility] today Wear CPAP machine for sleep apnea Miralax daily after surgery to prevent constipation Follow-up with Dr Tobie Poet on 04/24/21 at 10:15 as scheduled   Follow-up:  Keep follow-up appointment with Dr Tobie Poet 04/24/21

## 2021-02-22 NOTE — Patient Instructions (Addendum)
Continue medications as prescribed We will call you with lab results and fax them to Dr Nila Nephew Get chest x-ray at [facility] today Wear CPAP machine for sleep apnea Miralax daily after surgery to prevent constipation Follow-up with Dr Tobie Poet on 04/24/21 at 10:15 as scheduled Fall Prevention in the Home, Adult Falls can cause injuries and can happen to people of all ages. There are many things you can do to make your home safe and to help prevent falls. Ask for help when making these changes. What actions can I take to prevent falls? General Instructions  Use good lighting in all rooms. Replace any light bulbs that burn out.  Turn on the lights in dark areas. Use night-lights.  Keep items that you use often in easy-to-reach places. Lower the shelves around your home if needed.  Set up your furniture so you have a clear path. Avoid moving your furniture around.  Do not have throw rugs or other things on the floor that can make you trip.  Avoid walking on wet floors.  If any of your floors are uneven, fix them.  Add color or contrast paint or tape to clearly mark and help you see: ? Grab bars or handrails. ? First and last steps of staircases. ? Where the edge of each step is.  If you use a stepladder: ? Make sure that it is fully opened. Do not climb a closed stepladder. ? Make sure the sides of the stepladder are locked in place. ? Ask someone to hold the stepladder while you use it.  Know where your pets are when moving through your home. What can I do in the bathroom?  Keep the floor dry. Clean up any water on the floor right away.  Remove soap buildup in the tub or shower.  Use nonskid mats or decals on the floor of the tub or shower.  Attach bath mats securely with double-sided, nonslip rug tape.  If you need to sit down in the shower, use a plastic, nonslip stool.  Install grab bars by the toilet and in the tub and shower. Do not use towel bars as grab bars.       What can I do in the bedroom?  Make sure that you have a light by your bed that is easy to reach.  Do not use any sheets or blankets for your bed that hang to the floor.  Have a firm chair with side arms that you can use for support when you get dressed. What can I do in the kitchen?  Clean up any spills right away.  If you need to reach something above you, use a step stool with a grab bar.  Keep electrical cords out of the way.  Do not use floor polish or wax that makes floors slippery. What can I do with my stairs?  Do not leave any items on the stairs.  Make sure that you have a light switch at the top and the bottom of the stairs.  Make sure that there are handrails on both sides of the stairs. Fix handrails that are broken or loose.  Install nonslip stair treads on all your stairs.  Avoid having throw rugs at the top or bottom of the stairs.  Choose a carpet that does not hide the edge of the steps on the stairs.  Check carpeting to make sure that it is firmly attached to the stairs. Fix carpet that is loose or worn. What can I do  on the outside of my home?  Use bright outdoor lighting.  Fix the edges of walkways and driveways and fix any cracks.  Remove anything that might make you trip as you walk through a door, such as a raised step or threshold.  Trim any bushes or trees on paths to your home.  Check to see if handrails are loose or broken and that both sides of all steps have handrails.  Install guardrails along the edges of any raised decks and porches.  Clear paths of anything that can make you trip, such as tools or rocks.  Have leaves, snow, or ice cleared regularly.  Use sand or salt on paths during winter.  Clean up any spills in your garage right away. This includes grease or oil spills. What other actions can I take?  Wear shoes that: ? Have a low heel. Do not wear high heels. ? Have rubber bottoms. ? Feel good on your feet and fit  well. ? Are closed at the toe. Do not wear open-toe sandals.  Use tools that help you move around if needed. These include: ? Canes. ? Walkers. ? Scooters. ? Crutches.  Review your medicines with your doctor. Some medicines can make you feel dizzy. This can increase your chance of falling. Ask your doctor what else you can do to help prevent falls. Where to find more information  Centers for Disease Control and Prevention, [employer]: http://www.wolf.info/  National Institute on Aging: http://kim-miller.com/ Contact a doctor if:  You are afraid of falling at home.  You feel weak, drowsy, or dizzy at home.  You fall at home. Summary  There are many simple things that you can do to make your home safe and to help prevent falls.  Ways to make your home safe include removing things that can make you trip and installing grab bars in the bathroom.  Ask for help when making these changes in your home. This information is not intended to replace advice given to you by your health care provider. Make sure you discuss any questions you have with your health care provider. Document Revised: 07/01/2020 Document Reviewed: 07/01/2020 Elsevier Patient Education  [place] Before and After Surgery Your health care providers, including your surgical team, take many precautions to keep you safe during surgery. This sheet explains the steps that your health care providers take to prevent surgical error and to reduce the risk of complications. It also lists some things that you, your friends, and your family members can do to ensure a safe surgery. What is surgical error? Surgical error is when a mistake is made during a procedure. Types of surgical error include:  Operating on the wrong person.  Operating on the wrong body part.  Performing the wrong operation.  Making a medicine mistake.  Making a surgical mistake. What is a complication? A complication is a change in the normal surgical  plan of care. Complications are known risks that, while rare, can occur during or after surgery. Examples of complications include:  Infection.  Excess bleeding.  Inhaling food or liquids from your stomach into your lungs (aspiration).  Allergic reaction to medicines. Review all risks and complications with your health care provider as part of the informed consent procedure before your surgery. Ask questions if you do not understand something. What steps are taken to prevent surgical error and complications? Your health care providers, including your surgical team, take many steps before and during a procedure to reduce the risk  of error and complication. These steps include:  Placing an identification bracelet on your wrist and checking it often.  Checking the surgical consent form to make sure it is correct.  Reviewing the surgical consent form with you to make sure that you understand the procedure.  Reviewing your medicines and history of allergies to prevent drug reactions and interactions.  Marking the surgical site before the procedure.  Washing and disinfecting the surgical site before the procedure to prevent infection.  Giving you a list of safety steps to take before surgery. This usually includes what foods or medicines to avoid and any lifestyle changes you should make before the procedure, such as quitting smoking. How can I help to promote a safe surgery? One way you can help to prevent surgical error is by choosing a surgeon and facility that is right for you. To do this:  Ask how many times your surgeon has performed the surgery you will be having.  Ask how often the surgery is done in the hospital where you will have the procedure.  Choose a hospital that is licensed, accredited, and has a system in place in case of an emergency. Here are some other steps you can take:  Talk to your anesthesia provider about your medicines, allergies, any drug use, and any  previous drug or anesthesia reactions you have had. Be honest.  Review the surgical consent form. Make sure you understand it completely.  Mark your surgical site. This will be done just before surgery. Your health care provider will give you a special marker to use. Make sure it is the correct area for surgery.  Do not eat any food or drink before surgery if your surgeon has instructed you not to.  Keep the surgical area clean. Follow instructions if you are asked to shower with an antibacterial soap.  Follow all of your home care instructions. Wash your hands often, and especially before bandage (dressing) changes.   How can my friends or family members promote a safe surgery? Friends and family can help by:  Going with you to your medical appointments before your surgery to make sure you understand the procedure.  Reviewing the safety steps that you need to take before surgery and making sure that you follow them.  Knowing what surgery you are scheduled to have.  Double-checking that the correct body part is marked before surgery.  Going with you to the procedure.  Advocating for you on your behalf if you cannot do so.  Following the home care instructions for after the surgery to help you heal well. Questions to ask before surgery 1. Why do I need surgery? Is another treatment available? 2. What are the risks and benefits of the surgery? 3. Do I have a choice of anesthesia? 4. What should I expect after surgery? Can I eat and drink after surgery? How much pain will I have? When can I go back to work? 5. What is your experience with this type of surgery? Is the surgery center accredited? 6. Do you take my insurance? How much will the surgery cost? Where to find more information  [employer]: http://www.hawkins-wells.info/  Centers for Disease Control and Prevention:  VerifiedMovies.gl Summary  Your surgical team takes specific precautions to prevent surgical errors and complications.  You can take steps to promote a safe surgery.  Ask questions before your procedure, and make sure that you understand what will happen.  Make sure that you understand your instructions for home  care. This information is not intended to replace advice given to you by your health care provider. Make sure you discuss any questions you have with your health care provider. Document Revised: 05/21/2020 Document Reviewed: 05/21/2020 Elsevier Patient Education  [place].

## 2021-02-23 DIAGNOSIS — Z20828 Contact with and (suspected) exposure to other viral communicable diseases: Secondary | ICD-10-CM | POA: Diagnosis not present

## 2021-02-23 DIAGNOSIS — Z1159 Encounter for screening for other viral diseases: Secondary | ICD-10-CM | POA: Diagnosis not present

## 2021-02-25 DIAGNOSIS — C673 Malignant neoplasm of anterior wall of bladder: Secondary | ICD-10-CM | POA: Diagnosis not present

## 2021-02-25 DIAGNOSIS — I714 Abdominal aortic aneurysm, without rupture: Secondary | ICD-10-CM | POA: Diagnosis not present

## 2021-02-25 DIAGNOSIS — K219 Gastro-esophageal reflux disease without esophagitis: Secondary | ICD-10-CM | POA: Diagnosis not present

## 2021-02-25 DIAGNOSIS — D494 Neoplasm of unspecified behavior of bladder: Secondary | ICD-10-CM | POA: Diagnosis not present

## 2021-02-25 DIAGNOSIS — I10 Essential (primary) hypertension: Secondary | ICD-10-CM | POA: Diagnosis not present

## 2021-02-25 DIAGNOSIS — J45909 Unspecified asthma, uncomplicated: Secondary | ICD-10-CM | POA: Diagnosis not present

## 2021-02-25 DIAGNOSIS — C671 Malignant neoplasm of dome of bladder: Secondary | ICD-10-CM | POA: Diagnosis not present

## 2021-02-25 DIAGNOSIS — Z87891 Personal history of nicotine dependence: Secondary | ICD-10-CM | POA: Diagnosis not present

## 2021-02-25 DIAGNOSIS — N400 Enlarged prostate without lower urinary tract symptoms: Secondary | ICD-10-CM | POA: Diagnosis not present

## 2021-02-25 DIAGNOSIS — G4730 Sleep apnea, unspecified: Secondary | ICD-10-CM | POA: Diagnosis not present

## 2021-02-25 DIAGNOSIS — I69351 Hemiplegia and hemiparesis following cerebral infarction affecting right dominant side: Secondary | ICD-10-CM | POA: Diagnosis not present

## 2021-03-03 DIAGNOSIS — C673 Malignant neoplasm of anterior wall of bladder: Secondary | ICD-10-CM | POA: Diagnosis not present

## 2021-03-03 DIAGNOSIS — N401 Enlarged prostate with lower urinary tract symptoms: Secondary | ICD-10-CM | POA: Diagnosis not present

## 2021-03-08 ENCOUNTER — Other Ambulatory Visit: Payer: Self-pay | Admitting: Family Medicine

## 2021-04-08 ENCOUNTER — Other Ambulatory Visit: Payer: Self-pay | Admitting: Family Medicine

## 2021-04-08 ENCOUNTER — Other Ambulatory Visit: Payer: Self-pay | Admitting: Physician Assistant

## 2021-05-04 ENCOUNTER — Ambulatory Visit (INDEPENDENT_AMBULATORY_CARE_PROVIDER_SITE_OTHER): Payer: PPO | Admitting: Family Medicine

## 2021-05-04 ENCOUNTER — Ambulatory Visit (INDEPENDENT_AMBULATORY_CARE_PROVIDER_SITE_OTHER): Payer: PPO

## 2021-05-04 ENCOUNTER — Encounter: Payer: Self-pay | Admitting: Family Medicine

## 2021-05-04 VITALS — BP 124/60 | HR 78 | Temp 97.3°F | Resp 18 | Ht 70.0 in | Wt 203.0 lb

## 2021-05-04 DIAGNOSIS — I700 Atherosclerosis of aorta: Secondary | ICD-10-CM

## 2021-05-04 DIAGNOSIS — J4520 Mild intermittent asthma, uncomplicated: Secondary | ICD-10-CM

## 2021-05-04 DIAGNOSIS — D6489 Other specified anemias: Secondary | ICD-10-CM | POA: Diagnosis not present

## 2021-05-04 DIAGNOSIS — Z8673 Personal history of transient ischemic attack (TIA), and cerebral infarction without residual deficits: Secondary | ICD-10-CM | POA: Diagnosis not present

## 2021-05-04 DIAGNOSIS — H903 Sensorineural hearing loss, bilateral: Secondary | ICD-10-CM | POA: Insufficient documentation

## 2021-05-04 DIAGNOSIS — I10 Essential (primary) hypertension: Secondary | ICD-10-CM | POA: Diagnosis not present

## 2021-05-04 DIAGNOSIS — I69393 Ataxia following cerebral infarction: Secondary | ICD-10-CM

## 2021-05-04 DIAGNOSIS — Z23 Encounter for immunization: Secondary | ICD-10-CM

## 2021-05-04 DIAGNOSIS — G4733 Obstructive sleep apnea (adult) (pediatric): Secondary | ICD-10-CM

## 2021-05-04 DIAGNOSIS — E782 Mixed hyperlipidemia: Secondary | ICD-10-CM

## 2021-05-04 NOTE — Progress Notes (Signed)
Subjective:  Patient ID: Kyle Torres, male    DOB: 11-28-1934  Age: 87 y.o. MRN: [id number]  Chief Complaint  Patient presents with  . Hypertension  . Gastroesophageal Reflux  . Hyperlipidemia    HPI Patient is a 87 year old white male with history of a stroke, hyperlipidemia, hypertension and GERD.  He presents fasting for follow-up. Hyperlipidemia: Currently on Crestor.  Patient eats healthy for the most part.  He tries to be active as much as he can following his back surgery. His last lipid panel was at goal.    Hypertension: Currently on amlodipine.  Blood pressure ranges from 97/52 up to 147/70.  Denies chest pain or shortness of breath.  History of a stroke.  Patient has mild ataxia still.  He currently takes aspirin and his statin medicines.  GERD: Currently on omeprazole 20 mg twice daily.  Patient thinks he may be able to drop down to once a day.  I did discuss chronic care management with our pharmacist and he does not feel that is necessary at this time.  Anemia: Mild.  We will recheck today.  Was normocytic in the past.  Asthma: Currently on Asmanex scheduled.  Takes albuterol as needed.  Rarely ever needs this.  Also is on Flonase nasal spray.  BPH: On Proscar and terazosin..  OSA: on cpap.  Current Outpatient Medications on File Prior to Visit  Medication Sig Dispense Refill  . albuterol (PROVENTIL HFA;VENTOLIN HFA) 108 (90 BASE) MCG/ACT inhaler Inhale 1 puff into the lungs every 6 (six) hours as needed. FOR SHORTNESS OF BREATH    . amLODipine (NORVASC) 5 MG tablet TAKE ONE (1) TABLET ONCE DAILY 90 tablet 0  . aspirin EC 81 MG tablet Take 1 tablet (81 mg total) by mouth daily. 90 tablet 3  . finasteride (PROSCAR) 5 MG tablet Take 5 mg by mouth daily.    . fluticasone (FLONASE) 50 MCG/ACT nasal spray Place into both nostrils daily.    . mometasone (ASMANEX) 220 MCG/INH inhaler Inhale 1 puff into the lungs 2 (two) times daily.    . Multiple Vitamin  (MULTIVITAMIN) tablet Take 1 tablet by mouth daily.    Marland Kitchen omeprazole (PRILOSEC) 20 MG capsule Take 20 mg by mouth 2 (two) times daily.    . rosuvastatin (CRESTOR) 20 MG tablet TAKE ONE (1) TABLET BY MOUTH ONCE DAILY 30 tablet 0  . terazosin (HYTRIN) 5 MG capsule Take 5 mg by mouth 2 (two) times daily.     No current facility-administered medications on file prior to visit.   Past Medical History:  Diagnosis Date  . Arthritis   . Asthma   . BPH (benign prostatic hypertrophy)   . Essential hypertension, malignant 04/22/2020  . GERD (gastroesophageal reflux disease)   . Hiatal hernia neck pain and back pain   . Reflux   . Sleep apnea study was 31yrs ago at [facility]   . Stroke ([place]) 02/2018   rt hand has difficulty with fine movements.   Past Surgical History:  Procedure Laterality Date  . ANTERIOR CERVICAL DECOMP/DISCECTOMY FUSION  10/19/2011   Procedure: ANTERIOR CERVICAL DECOMPRESSION/DISCECTOMY FUSION 2 LEVELS;  Surgeon: Ophelia Charter;  Location: [facility];  Service: Neurosurgery;  Laterality: N/A;  CERVICAL FIVE-SIX, SIX-SEVEN ANTERIOR CERVICAL DECOMPRESSION WITH FUSION AND  INTERBODY PROTHESIS PLATING  . CHOLECYSTECTOMY  2010  . EYE SURGERY    . LUMBAR SPINE SURGERY  05/2020   L4-5 LAMINECTOMY AND FUSION W/ PEDICLE SCREWS, TLIF, POSSIBLE ALLOGRAFT &  BONE MARROW ASPIRATION. [facility]  . RETINAL LASER PROCEDURE  1999   Torn retina, laser repair  . SHOULDER SURGERY  2000    Family History  Problem Relation Age of Onset  . Cirrhosis Mother   . Lung cancer Father   . Cancer Sister   . Coronary artery disease Neg Hx    Social History   Socioeconomic History  . Marital status: Married    Spouse name: Not on file  . Number of children: Not on file  . Years of education: Not on file  . Highest education level: Not on file  Occupational History  . 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 Smoking status: Former Smoker     Quit date: 12/13/1975    Years since quitting: 45.4  . Smokeless tobacco: Former Systems developer    Quit date: 10/10/1983  Substance and Sexual Activity  . Alcohol use: No  . Drug use: No  . Sexual activity: Not on file  Other Topics Concern  . Not on file  Social History Narrative  . Not on file   Social Determinants of Health   Financial Resource Strain: Not on file  Food Insecurity: Not on file  Transportation Needs: Not on file  Physical Activity: Not on file  Stress: Not on file  Social Connections: Not on file    Review of Systems  Constitutional: Negative for chills and fever.  HENT: Negative for congestion, rhinorrhea and sore throat.   Respiratory: Negative for cough and shortness of breath.   Cardiovascular: Negative for chest pain and palpitations.  Gastrointestinal: Negative for abdominal pain, constipation, diarrhea, nausea and vomiting.  Genitourinary: Negative for dysuria and urgency.  Musculoskeletal: Positive for back pain (some with walking). Negative for arthralgias and myalgias.  Neurological: Negative for dizziness and headaches.  Psychiatric/Behavioral: Negative for dysphoric mood. The patient is not nervous/anxious.      Objective:  BP 124/60   Pulse 78   Temp (!) 97.3 F (36.3 C)   Resp 18   Ht 5\' 10"  (1.778 m)   Wt 203 lb (92.1 kg)   BMI 29.13 kg/m   BP/Weight 05/04/2021 02/22/2021 6/76/7209  Systolic BP 470 962 836  Diastolic BP 60 68 58  Wt. (Lbs) 203 207 201  BMI 29.13 29.7 28.84    Physical Exam Vitals reviewed.  Constitutional:      Appearance: Normal appearance.  Neck:     Vascular: No carotid bruit.  Cardiovascular:     Rate and Rhythm: Normal rate and regular rhythm.     Heart sounds: Normal heart sounds.  Pulmonary:     Effort: Pulmonary effort is normal.     Breath sounds: Normal breath sounds. No wheezing, rhonchi or rales.  Abdominal:     General: Bowel sounds are normal.     Palpations: Abdomen is soft.     Tenderness:  There is no abdominal tenderness.  Musculoskeletal:     Right lower leg: No edema.     Left lower leg: No edema.  Neurological:     Mental Status: He is alert.  Psychiatric:        Mood and Affect: Mood normal.        Behavior: Behavior normal.     Diabetic Foot Exam - Simple   No data filed      Lab Results  Component Value Date   WBC 7.7 02/22/2021   HGB 12.0 (L) 02/22/2021   HCT  35.9 (L) 02/22/2021   PLT 246 02/22/2021   GLUCOSE 82 02/03/2021   CHOL 153 02/03/2021   TRIG 45 02/03/2021   HDL 61 02/03/2021   LDLCALC 82 02/03/2021   ALT 15 02/03/2021   AST 20 02/03/2021   NA 142 02/03/2021   K 4.6 02/03/2021   CL 103 02/03/2021   CREATININE 0.84 02/03/2021   BUN 16 02/03/2021   CO2 26 02/03/2021   TSH 1.630 02/03/2021   INR 1.2 10/13/2020      Assessment & Plan:   1. OSA (obstructive sleep apnea)  Continue cpap  2. Essential hypertension Well controlled.  No changes to medicines.  Continue to work on eating a healthy diet and exercise.  Labs drawn today.  - CBC with Differential/Platelet - Comprehensive metabolic panel  3. Mixed hyperlipidemia Well controlled.  No changes to medicines.  Continue to work on eating a healthy diet and exercise.  Labs drawn today.  - Lipid panel  4. History of CVA (cerebrovascular accident) The current medical regimen is effective;  continue present plan and medications.  5. Mild intermittent asthma without complication The current medical regimen is effective;  continue present plan and medications.  6. Atherosclerosis of aorta (HCC) The current medical regimen is effective;  continue present plan and medications. Low fat diet.   7. Ataxia, post-stroke  The current medical regimen is effective;  continue present plan and medications.    Orders Placed This Encounter  Procedures  . CBC with Differential/Platelet  . Comprehensive metabolic panel  . Lipid panel     Follow-up: Return in about 6 months  (around 11/04/2021) for AWV.  An After Visit Summary was printed and given to the patient.  Rochel Brome, MD [facility] [phone]

## 2021-05-05 LAB — CBC WITH DIFFERENTIAL/PLATELET
Basophils Absolute: 0 10*3/uL (ref 0.0–0.2)
Basos: 0 %
EOS (ABSOLUTE): 0.2 10*3/uL (ref 0.0–0.4)
Eos: 3 %
Hematocrit: 36.8 % — ABNORMAL LOW (ref 37.5–51.0)
Hemoglobin: 12.1 g/dL — ABNORMAL LOW (ref 13.0–17.7)
Immature Grans (Abs): 0 10*3/uL (ref 0.0–0.1)
Immature Granulocytes: 0 %
Lymphocytes Absolute: 2.8 10*3/uL (ref 0.7–3.1)
Lymphs: 30 %
MCH: 32.1 pg (ref 26.6–33.0)
MCHC: 32.9 g/dL (ref 31.5–35.7)
MCV: 98 fL — ABNORMAL HIGH (ref 79–97)
Monocytes Absolute: 0.7 10*3/uL (ref 0.1–0.9)
Monocytes: 7 %
Neutrophils Absolute: 5.5 10*3/uL (ref 1.4–7.0)
Neutrophils: 60 %
Platelets: 249 10*3/uL (ref 150–450)
RBC: 3.77 x10E6/uL — ABNORMAL LOW (ref 4.14–5.80)
RDW: 13.1 % (ref 11.6–15.4)
WBC: 9.3 10*3/uL (ref 3.4–10.8)

## 2021-05-05 LAB — LIPID PANEL
Chol/HDL Ratio: 2 ratio (ref 0.0–5.0)
Cholesterol, Total: 104 mg/dL (ref 100–199)
HDL: 53 mg/dL (ref >39)
LDL Chol Calc (NIH): 38 mg/dL (ref 0–99)
Triglycerides: 59 mg/dL (ref 0–149)
VLDL Cholesterol Cal: 13 mg/dL (ref 5–40)

## 2021-05-05 LAB — CARDIOVASCULAR RISK ASSESSMENT

## 2021-05-05 LAB — COMPREHENSIVE METABOLIC PANEL
ALT: 22 IU/L (ref 0–44)
AST: 24 IU/L (ref 0–40)
Albumin/Globulin Ratio: 1.2 (ref 1.2–2.2)
Albumin: 3.8 g/dL (ref 3.6–4.6)
Alkaline Phosphatase: 75 IU/L (ref 44–121)
BUN/Creatinine Ratio: 18 (ref 10–24)
BUN: 16 mg/dL (ref 8–27)
Bilirubin Total: 0.3 mg/dL (ref 0.0–1.2)
CO2: 23 mmol/L (ref 20–29)
Calcium: 8.8 mg/dL (ref 8.6–10.2)
Chloride: 101 mmol/L (ref 96–106)
Creatinine, Ser: 0.91 mg/dL (ref 0.76–1.27)
Globulin, Total: 3.1 g/dL (ref 1.5–4.5)
Glucose: 80 mg/dL (ref 65–99)
Potassium: 4.6 mmol/L (ref 3.5–5.2)
Sodium: 138 mmol/L (ref 134–144)
Total Protein: 6.9 g/dL (ref 6.0–8.5)
eGFR: 83 mL/min/{1.73_m2} (ref >59)

## 2021-05-12 ENCOUNTER — Other Ambulatory Visit: Payer: Self-pay | Admitting: Physician Assistant

## 2021-05-15 LAB — SPECIMEN STATUS REPORT

## 2021-05-15 LAB — B12 AND FOLATE PANEL
Folate: >20 ng/mL (ref >3.0)
Vitamin B-12: 551 pg/mL (ref 232–1245)

## 2021-05-15 LAB — METHYLMALONIC ACID, SERUM: Methylmalonic Acid: 174 nmol/L (ref 0–378)

## 2021-05-24 ENCOUNTER — Other Ambulatory Visit: Payer: Self-pay | Admitting: Family Medicine

## 2021-05-24 DIAGNOSIS — I739 Peripheral vascular disease, unspecified: Secondary | ICD-10-CM

## 2021-06-03 ENCOUNTER — Other Ambulatory Visit: Payer: Self-pay | Admitting: Physician Assistant

## 2021-07-05 ENCOUNTER — Other Ambulatory Visit: Payer: Self-pay | Admitting: Physician Assistant

## 2021-07-13 ENCOUNTER — Other Ambulatory Visit: Payer: Self-pay | Admitting: Physician Assistant

## 2021-08-17 ENCOUNTER — Other Ambulatory Visit: Payer: Self-pay | Admitting: Physician Assistant

## 2021-10-04 ENCOUNTER — Ambulatory Visit (INDEPENDENT_AMBULATORY_CARE_PROVIDER_SITE_OTHER): Payer: PPO

## 2021-10-04 ENCOUNTER — Other Ambulatory Visit: Payer: Self-pay

## 2021-10-04 DIAGNOSIS — Z23 Encounter for immunization: Secondary | ICD-10-CM

## 2021-10-04 NOTE — Progress Notes (Signed)
   Covid-19 Vaccination Clinic  Name:  KRATOS RUSCITTI    MRN: [id number] DOB: 20-Jul-1934  10/04/2021  Mr. Niznik was observed post Covid-19 immunization for 15 minutes without incident. He was provided with Vaccine Information Sheet and instruction to access the V-Safe system.   Mr. Tupper was instructed to call [phone] with any severe reactions post vaccine: Difficulty breathing  Swelling of face and throat  A fast heartbeat  A bad rash all over body  Dizziness and weakness   Immunizations Administered     Name Date Dose VIS Date Route   Moderna Covid-19 vaccine Bivalent Booster 10/04/2021 11:11 AM 0.5 mL 07/24/2021 Intramuscular   Manufacturer: Moderna   Lot: [id number]   Sellers: [phone]

## 2021-11-02 ENCOUNTER — Encounter: Payer: Self-pay | Admitting: Family Medicine

## 2021-11-02 ENCOUNTER — Other Ambulatory Visit: Payer: Self-pay

## 2021-11-02 ENCOUNTER — Ambulatory Visit (INDEPENDENT_AMBULATORY_CARE_PROVIDER_SITE_OTHER): Payer: PPO | Admitting: Family Medicine

## 2021-11-02 VITALS — BP 110/50 | HR 104 | Temp 98.7°F | Resp 18 | Ht 70.0 in | Wt 211.0 lb

## 2021-11-02 DIAGNOSIS — Z0000 Encounter for general adult medical examination without abnormal findings: Secondary | ICD-10-CM | POA: Diagnosis not present

## 2021-11-02 DIAGNOSIS — Z6829 Body mass index (BMI) 29.0-29.9, adult: Secondary | ICD-10-CM | POA: Diagnosis not present

## 2021-11-02 NOTE — Patient Instructions (Addendum)
Constipation: recommend metamucil. If this does not work, add in miralax twice a day. Recommend shingrix vaccine.   Things to do to keep yourself healthy  - Exercise at least 30-45 minutes a day, 3-4 days a week.  - Eat a low-fat diet with lots of fruits and vegetables, up to 7-9 servings per day.  - Seatbelts can save your life. Wear them always.  - Smoke detectors on every level of your home, check batteries every year.  - Eye Doctor - have an eye exam every 1-2 years  - Safe sex - if you may be exposed to STDs, use a condom.  - Alcohol -  If you drink, do it moderately, less than 2 drinks per day.  - [place]. Choose someone to speak for you if you are not able.  - Depression is common in our stressful world.If you're feeling down or losing interest in things you normally enjoy, please come in for a visit.

## 2021-11-02 NOTE — Progress Notes (Signed)
Annual Wellness Visit     Patient: Kyle Torres, Male    DOB: May 25, 1934, 87 y.o.   MRN: [id number] Visit Date: 11/02/2021  Today's Provider: Rochel Brome, MD   Chief Complaint  Patient presents with   Annual Exam   Subjective    Kyle Torres is a 87 y.o. male who presents today for his Annual Wellness Visit. He reports consuming a low fat diet. The patient does not participate in regular exercise at present. He generally feels well. He reports sleeping fairly well. Awakens in the middle of the night to go to the bathroom, then has trouble going back to sleep. Will sit in lazyboy and eventially falls asleep. He does have additional problems to discuss today.   HPI Patient lives alone.  Patient does not exercise.  He does try to eat healthy.  Patient has functional carbon monoxide detectors and smoke detectors.  He does wear his seatbelt.  He is not currently sexually active.  He is a widow.  Patient does not smoke or use illegal drugs.  Audit screening completed.   Medications: Outpatient Medications Prior to Visit  Medication Sig   albuterol (PROVENTIL HFA;VENTOLIN HFA) 108 (90 BASE) MCG/ACT inhaler Inhale 1 puff into the lungs every 6 (six) hours as needed. FOR SHORTNESS OF BREATH   amLODipine (NORVASC) 5 MG tablet TAKE ONE (1) TABLET ONCE DAILY   aspirin EC 81 MG tablet Take 1 tablet (81 mg total) by mouth daily.   finasteride (PROSCAR) 5 MG tablet Take 5 mg by mouth daily.   fluticasone (FLONASE) 50 MCG/ACT nasal spray Place into both nostrils daily.   mometasone (ASMANEX) 220 MCG/INH inhaler Inhale 1 puff into the lungs 2 (two) times daily.   Multiple Vitamin (MULTIVITAMIN) tablet Take 1 tablet by mouth daily.   omeprazole (PRILOSEC) 20 MG capsule Take 20 mg by mouth 2 (two) times daily.   terazosin (HYTRIN) 5 MG capsule Take 5 mg by mouth 2 (two) times daily.   [DISCONTINUED] rosuvastatin (CRESTOR) 20 MG tablet TAKE ONE (1) TABLET BY MOUTH ONCE DAILY   No  facility-administered medications prior to visit.    No Known Allergies  Patient Care Team: Eleanna Theilen, Elnita Maxwell, MD as PCP - General (Family Medicine)  Review of Systems  Constitutional:  Negative for chills, fatigue and fever.  HENT:  Negative for congestion, ear pain and sore throat.   Respiratory:  Negative for cough and shortness of breath.   Cardiovascular:  Negative for chest pain.  Gastrointestinal:  Positive for constipation (Needs a stool softener.). Negative for abdominal pain, diarrhea, nausea and vomiting.  Endocrine: Negative for polydipsia, polyphagia and polyuria.  Genitourinary:  Negative for dysuria and frequency.  Musculoskeletal:  Negative for arthralgias and myalgias.  Neurological:  Negative for dizziness and headaches.  Psychiatric/Behavioral:  Negative for dysphoric mood.        No dysphoria       Objective    Vitals: BP (!) 110/50   Pulse (!) 104   Temp 98.7 F (37.1 C)   Resp 18   Ht 5\' 10"  (1.778 m)   Wt 211 lb (95.7 kg)   BMI 30.28 kg/m    Physical Exam Vitals reviewed.  Constitutional:      Appearance: Normal appearance.  Neck:     Vascular: No carotid bruit.  Cardiovascular:     Rate and Rhythm: Normal rate and regular rhythm.     Pulses: Normal pulses.     Heart sounds: Normal  heart sounds.  Pulmonary:     Effort: Pulmonary effort is normal.     Breath sounds: Normal breath sounds. No wheezing, rhonchi or rales.  Abdominal:     General: Bowel sounds are normal.     Palpations: Abdomen is soft.     Tenderness: There is no abdominal tenderness.  Neurological:     Mental Status: He is alert.  Psychiatric:        Mood and Affect: Mood normal.        Behavior: Behavior normal.   Most recent functional status assessment: In your present state of health, do you have any difficulty performing the following activities: 11/17/2021  Hearing? Y  Vision? N  Difficulty concentrating or making decisions? N  Walking or climbing stairs? N  Dressing  or bathing? N  Doing errands, shopping? N  Preparing Food and eating ? N  Using the Toilet? N  In the past six months, have you accidently leaked urine? Y  Do you have problems with loss of bowel control? N  Managing your Medications? N  Managing your Finances? N  Housekeeping or managing your Housekeeping? N  Some recent data might be hidden   Most recent fall risk assessment: Fall Risk  11/17/2021  Falls in the past year? 1  Number falls in past yr: 1  Injury with Fall? 0  Risk for fall due to : History of fall(s);Impaired balance/gait  Follow up Falls evaluation completed;Education provided    Most recent depression screenings: PHQ 2/9 Scores 11/02/2021 05/04/2021  PHQ - 2 Score 0 0   Most recent cognitive screening: No flowsheet data found. Most recent Audit-C alcohol use screening Alcohol Use Disorder Test (AUDIT) 11/02/2021  2. How many drinks containing alcohol do you have on a typical day when you are drinking? 1  3. How often do you have six or more drinks on one occasion? 1   A score of 3 or more in women, and 4 or more in men indicates increased risk for alcohol abuse, EXCEPT if all of the points are from question 1   No results found for any visits on 11/02/21.  Assessment & Plan     Annual wellness visit done today including the all of the following: Reviewed patient's Family Medical History Reviewed and updated list of patient's medical providers Assessment of cognitive impairment was done Assessed patient's functional ability Established a written schedule for health screening [place] Completed and Reviewed  Exercise Activities and Dietary recommendations Problem List Items Addressed This Visit       Other   General medical exam - Primary    Constipation: recommend metamucil. If this does not work, add in miralax twice a day. Recommend shingrix vaccine.   Things to do to keep yourself healthy  - Exercise at least 30-45 minutes  a day, 3-4 days a week.  - Eat a low-fat diet with lots of fruits and vegetables, up to 7-9 servings per day.  - Seatbelts can save your life. Wear them always.  - Smoke detectors on every level of your home, check batteries every year.  - Eye Doctor - have an eye exam every 1-2 years  - Safe sex - if you may be exposed to STDs, use a condom.  - Alcohol -  If you drink, do it moderately, less than 2 drinks per day.  - [place]. Choose someone to speak for you if you are not able.  - Depression  is common in our stressful world.If you're feeling down or losing interest in things you normally enjoy, please come in for a visit.        BMI 29.0-29.9,adult    Recommend continue to work on eating healthy diet and exercise.          Immunization History  Administered Date(s) Administered   Fluad Quad(high Dose 65+) 08/24/2020, 10/04/2021   Influenza Split 08/12/2014   Influenza, High Dose Seasonal PF 08/27/2010, 08/26/2011, 08/27/2012, 09/11/2016, 09/11/2017, 09/11/2018   Influenza-Unspecified 01/12/2003, 10/03/2003, 09/11/2005, 08/18/2006, 08/13/2007, 08/12/2008, 08/12/2009, 09/13/2013, 09/12/2019, 08/12/2020   Moderna Covid-19 Vaccine Bivalent Booster 70yrs & up 10/04/2021   Moderna SARS-COV2 Booster Vaccination 11/10/2020, 05/04/2021   Moderna Sars-Covid-2 Vaccination 02/06/2020, 03/09/2020, 11/10/2020, 02/05/2021   Pneumococcal Conjugate-13 03/12/2014   Pneumococcal Polysaccharide-23 08/24/2020   Pneumococcal-Unspecified 01/12/2003   Tdap 06/29/2016    Health Maintenance  Topic Date Due   Zoster Vaccines- Shingrix (1 of 2) Never done   TETANUS/TDAP  06/29/2026   Pneumonia Vaccine 29+ Years old  Completed   INFLUENZA VACCINE  Completed   COVID-19 Vaccine  Completed   HPV VACCINES  Aged Out     Discussed health benefits of physical activity, and encouraged him to engage in regular exercise appropriate for his age and condition.    Return in about 6  weeks (around 12/13/2021) for chronic fasting.    Rochel Brome, MD  [place] [phone] (phone) [phone] (fax)  [place]

## 2021-11-10 ENCOUNTER — Other Ambulatory Visit: Payer: Self-pay | Admitting: Family Medicine

## 2021-11-17 DIAGNOSIS — Z23 Encounter for immunization: Secondary | ICD-10-CM | POA: Insufficient documentation

## 2021-11-17 DIAGNOSIS — Z0000 Encounter for general adult medical examination without abnormal findings: Secondary | ICD-10-CM | POA: Insufficient documentation

## 2021-11-17 DIAGNOSIS — Z6829 Body mass index (BMI) 29.0-29.9, adult: Secondary | ICD-10-CM | POA: Insufficient documentation

## 2021-11-17 NOTE — Assessment & Plan Note (Signed)
Recommend continue to work on eating healthy diet and exercise.  

## 2021-11-17 NOTE — Assessment & Plan Note (Signed)
Constipation: recommend metamucil. If this does not work, add in miralax twice a day. Recommend shingrix vaccine.   Things to do to keep yourself healthy  - Exercise at least 30-45 minutes a day, 3-4 days a week.  - Eat a low-fat diet with lots of fruits and vegetables, up to 7-9 servings per day.  - Seatbelts can save your life. Wear them always.  - Smoke detectors on every level of your home, check batteries every year.  - Eye Doctor - have an eye exam every 1-2 years  - Safe sex - if you may be exposed to STDs, use a condom.  - Alcohol -  If you drink, do it moderately, less than 2 drinks per day.  - [place]. Choose someone to speak for you if you are not able.  - Depression is common in our stressful world.If you're feeling down or losing interest in things you normally enjoy, please come in for a visit.

## 2022-01-20 ENCOUNTER — Other Ambulatory Visit: Payer: Self-pay | Admitting: Family Medicine

## 2022-02-08 ENCOUNTER — Other Ambulatory Visit: Payer: Self-pay | Admitting: Family Medicine

## 2022-02-08 NOTE — Telephone Encounter (Signed)
Refill sent to pharmacy.   

## 2022-04-15 ENCOUNTER — Other Ambulatory Visit: Payer: Self-pay | Admitting: Family Medicine

## 2022-05-07 ENCOUNTER — Other Ambulatory Visit: Payer: Self-pay | Admitting: Family Medicine

## 2022-08-09 ENCOUNTER — Other Ambulatory Visit: Payer: Self-pay | Admitting: Family Medicine

## 2022-08-18 ENCOUNTER — Other Ambulatory Visit: Payer: Self-pay

## 2022-08-18 MED ORDER — ROSUVASTATIN CALCIUM 20 MG PO TABS: 20.0000 mg | ORAL_TABLET | Freq: Every day | ORAL | 0 refills | Status: DC

## 2022-10-16 IMAGING — CT CT ABD-PELV W/ CM
2 of 5 series · 14 of 46 positions shown, 16 images · IV contrast (OMNIPAQUE 300)
Comparison: 04/01/2009

CLINICAL DATA: Hematuria, gait abnormality, urinary and fecal
incontinence

EXAM:
CT ABDOMEN AND PELVIS WITH CONTRAST
TECHNIQUE: Multidetector CT imaging of the abdomen and pelvis was performed
using the standard protocol following bolus administration of
intravenous contrast.
CONTRAST:  100mL OMNIPAQUE IOHEXOL 300 MG/ML  SOLN

[Series 2: axial st · axial · 0.88mm/px · z∈[+1055,+1530]mm · 11 of 109 slices shown, 13 images]
[im 7/109  soft-tissue]
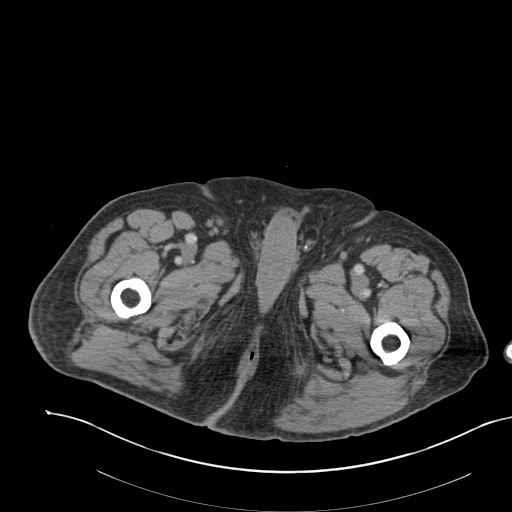
[im 7/109  bone]
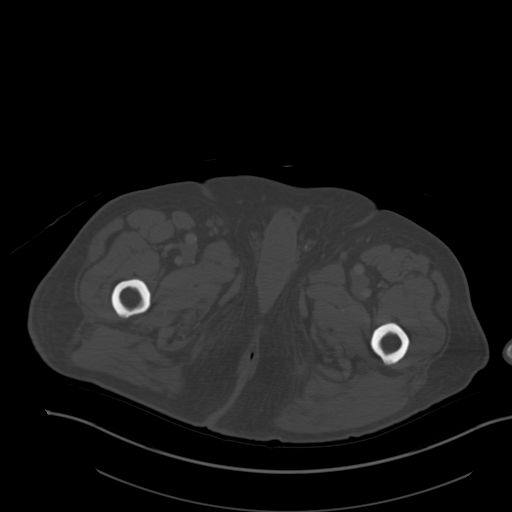
[im 21/109  soft-tissue]
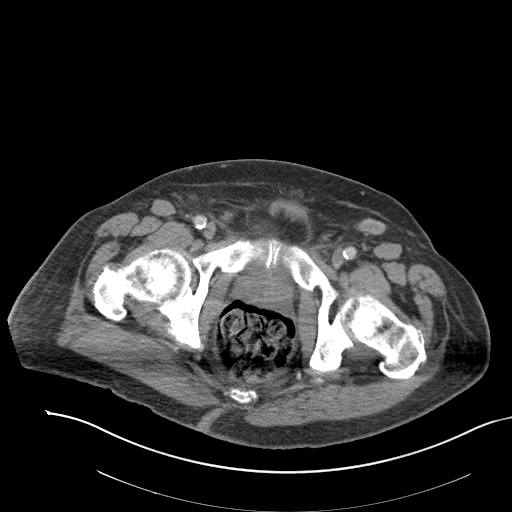
[im 28/109  soft-tissue]
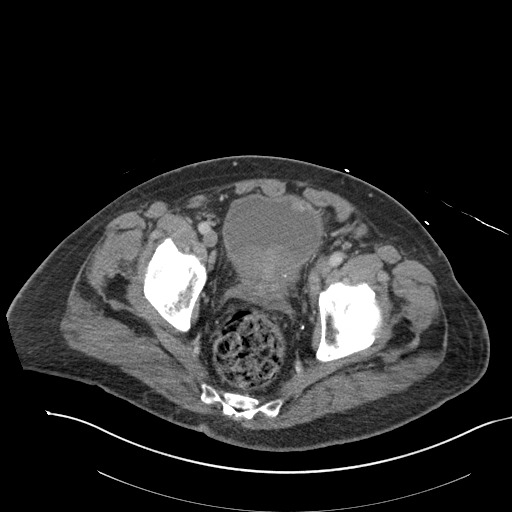
[im 34/109  soft-tissue]
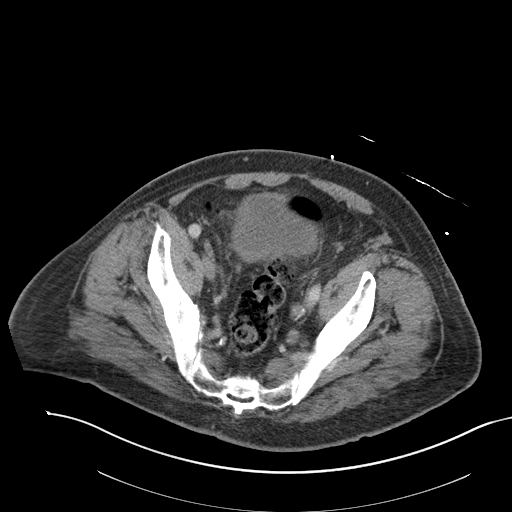
[im 48/109  soft-tissue]
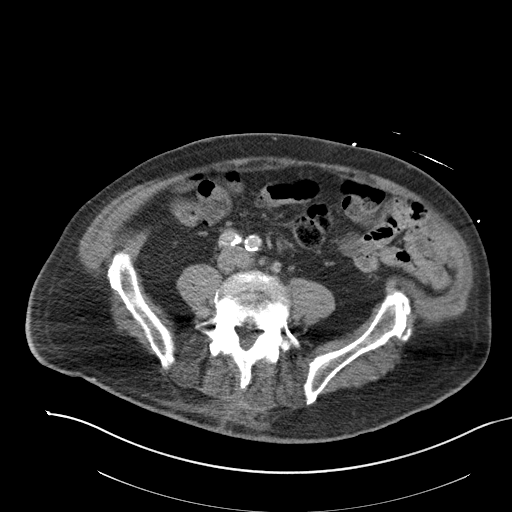
[im 55/109  soft-tissue]
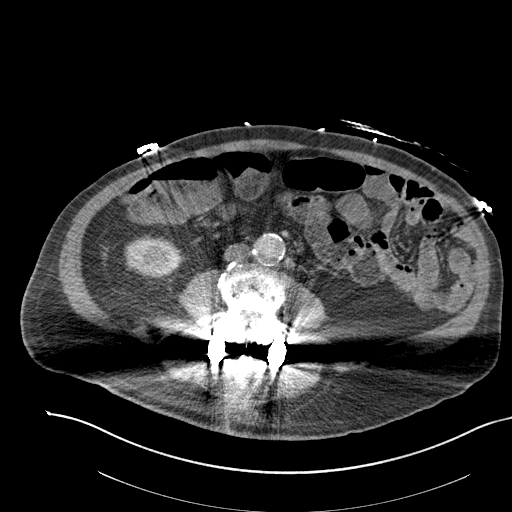
[im 61/109  soft-tissue]
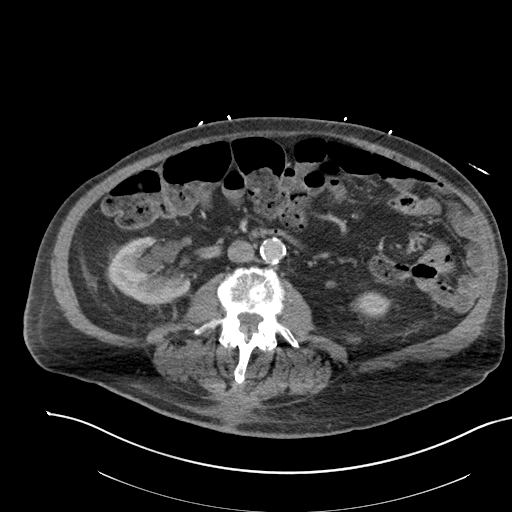
[im 75/109  soft-tissue]
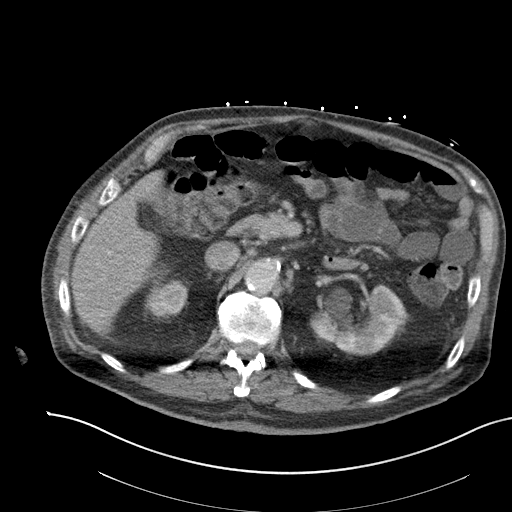
[im 82/109  soft-tissue]
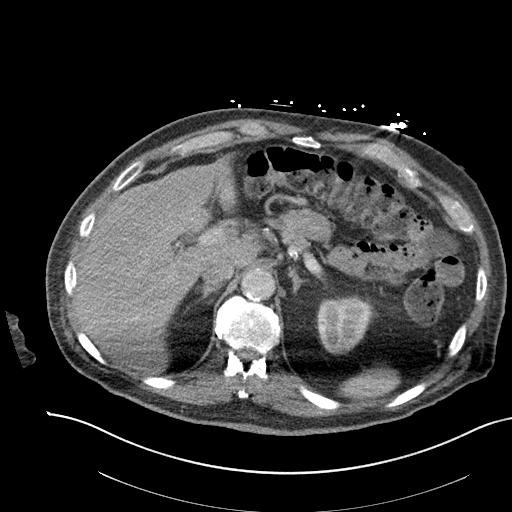
[im 82/109  bone]
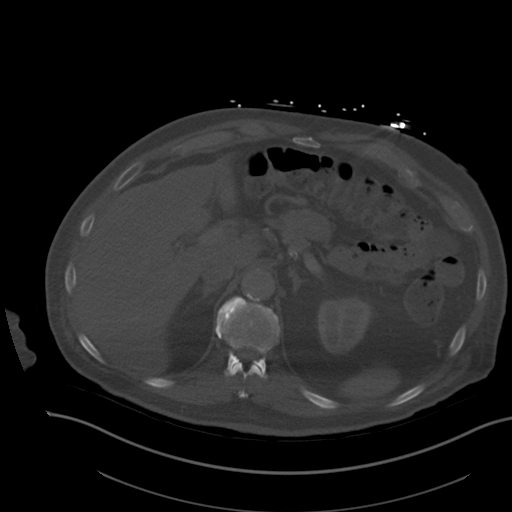
[im 88/109  soft-tissue]
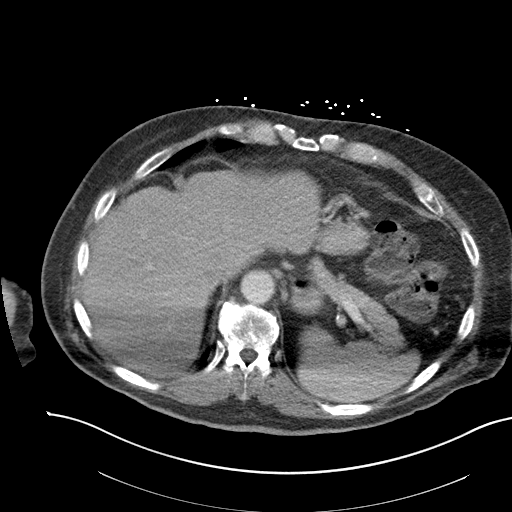
[im 102/109  soft-tissue]
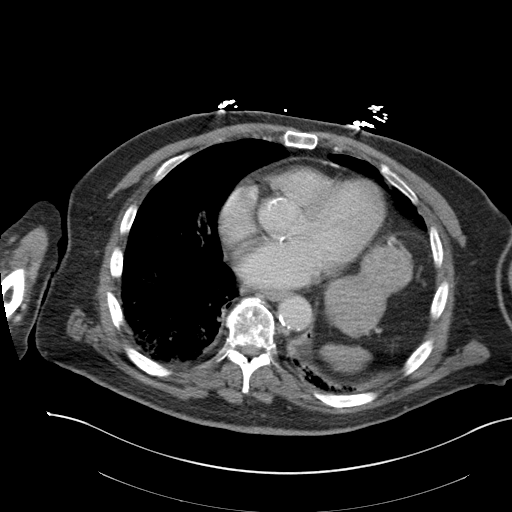

[Series 4: coronal st · coronal · 0.81mm/px · 3 of 146 slices shown]
[im 49/146  soft-tissue]
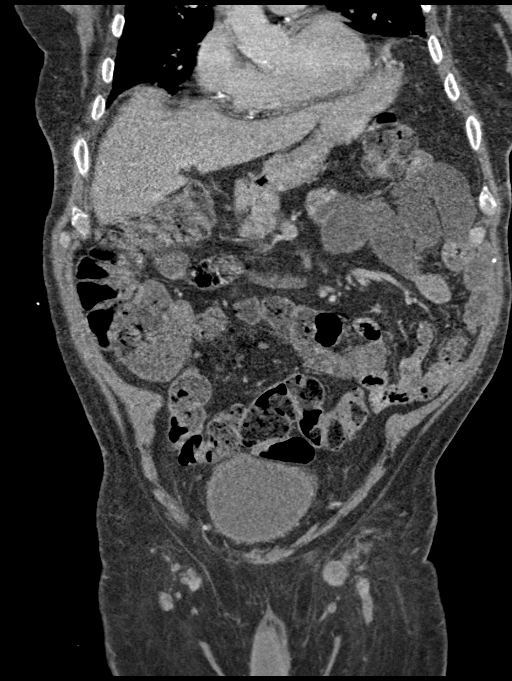
[im 65/146  soft-tissue]
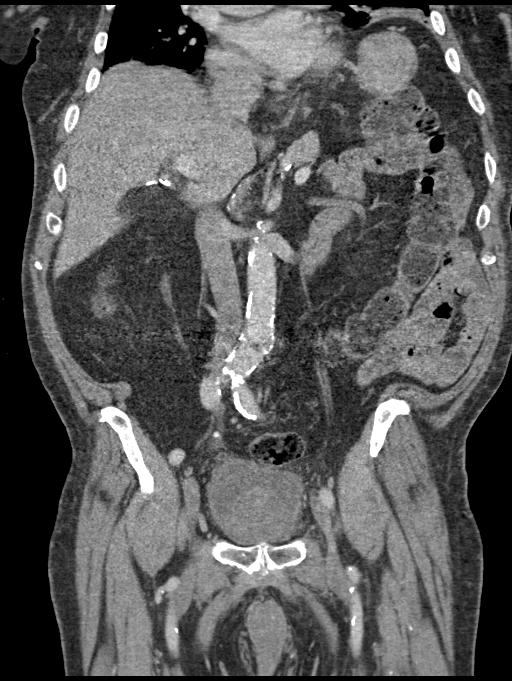
[im 81/146  soft-tissue]
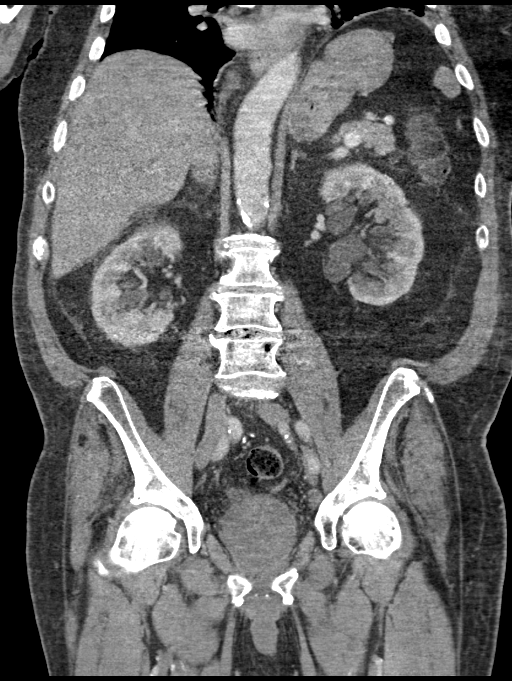

[14 of 46 positions shown; findings below may reference images not displayed]

FINDINGS: Lower chest: Elevation of the left hemidiaphragm again noted.
Progressive bibasilar pulmonary fibrotic change. Extensive coronary
artery calcification. Global cardiac size within normal limits. The
visualized pulmonary arterial tree is dilated centrally in keeping
with pulmonary arterial hypertension. Stable scattered pericardial
calcification

Hepatobiliary: Cholecystectomy has been performed. Liver
unremarkable. No intra or extrahepatic biliary ductal dilation.

Pancreas: Unremarkable

Spleen: Unremarkable

Adrenals/Urinary Tract: The adrenal glands are unremarkable. The
kidneys are normal in size and position. Multiple parapelvic cysts
are seen bilaterally. No hydronephrosis. No intrarenal or ureteral
calculi are identified. A 13 mm enhancing mural nodule is seen
within the left anterior bladder wall most in keeping with a primary
urothelial carcinoma. The prostate gland is moderately enlarged and
indents the base of the bladder. The bladder is not distended though
the bladder wall appears mildly thickened suggesting changes of
chronic bladder outlet obstruction.

Stomach/Bowel: There is moderate stool seen throughout the colon and
rectal vault. No evidence of obstruction. The stomach, small bowel,
and large bowel are otherwise unremarkable. Appendix normal. No free
intraperitoneal gas or fluid.

Vascular/Lymphatic: Moderate aortoiliac atherosclerotic
calcification. 3.0 cm fusiform infrarenal abdominal aortic aneurysm
is present. Several shotty lymph nodes are seen within the inguinal
lymph node groups bilaterally with at least 1 lymph node within the
left inguinal region demonstrating a prominent rounded
configuration. No frankly pathologic adenopathy within the abdomen
and pelvis by size criteria.

Reproductive: The prostate gland, as noted above, is moderately
enlarged, particularly centrally. Seminal vesicles are unremarkable.

Other: None significant

Musculoskeletal: No acute bone abnormality. L4-5 posterior lumbar
fusion with resection of the L4 spinous process is noted.
Degenerative changes are seen throughout the lumbar spine. No lytic
or blastic bone lesions.
IMPRESSION: 13 mm enhancing nodule within the bladder anteriorly most in keeping
with a primary urothelial carcinoma. Cystoscopic correlation is
recommended. Shotty left inguinal adenopathy is not frankly
pathologic, however, PET CT examination may be helpful to
definitively exclude nodal involvement. Alternatively, close
attention on subsequent examination would be helpful to ensure
stability or resolution

Moderate prostatic enlargement, particularly centrally, with
secondary changes suggesting at least mild chronic bladder outlet
obstruction.

3.0 cm fusiform infrarenal abdominal aortic aneurysm Recommend
follow-up ultrasound every 3 years. This recommendation follows ACR
consensus guidelines: White Paper of the ACR Incidental Findings
Committee II on Vascular Findings. [HOSPITAL] 3045;
[DATE].

Extensive coronary artery calcification

Aortic aneurysm NOS (FG1DU-YQL.K). Aortic Atherosclerosis
(FG1DU-RDY.Y).

## 2022-10-16 IMAGING — CT CT L SPINE W/O CM
3 series · 10 of 33 positions shown, 12 images · IV contrast (OMNIPAQUE 300)
Comparison: None.

CLINICAL DATA: Gait imbalance, fecal and urinary incontinence

EXAM:
CT LUMBAR SPINE WITHOUT CONTRAST
TECHNIQUE: Multidetector CT imaging of the lumbar spine was performed without
intravenous contrast administration. Multiplanar CT image
reconstructions were also generated.

[Series 1: axial l-spine bone · axial · 0.34mm/px · z∈[+1232,+1346]mm · 2 of 124 slices shown, 3 images]
[im 38/124  soft-tissue]
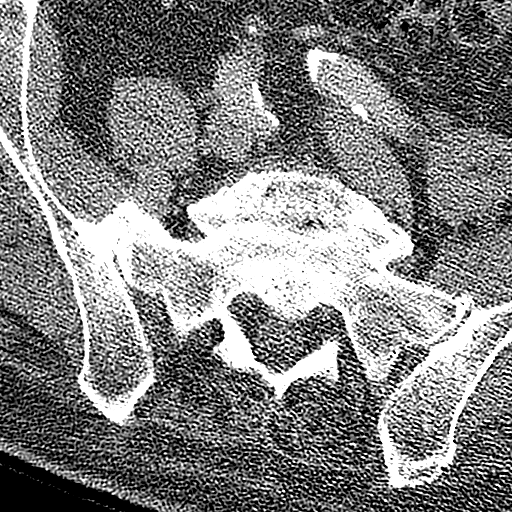
[im 38/124  bone]
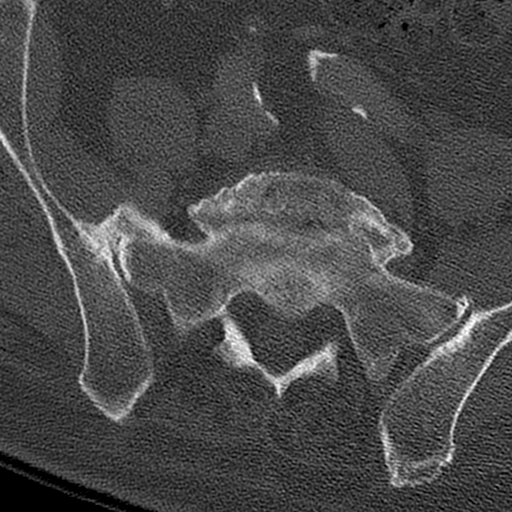
[im 95/124  bone]
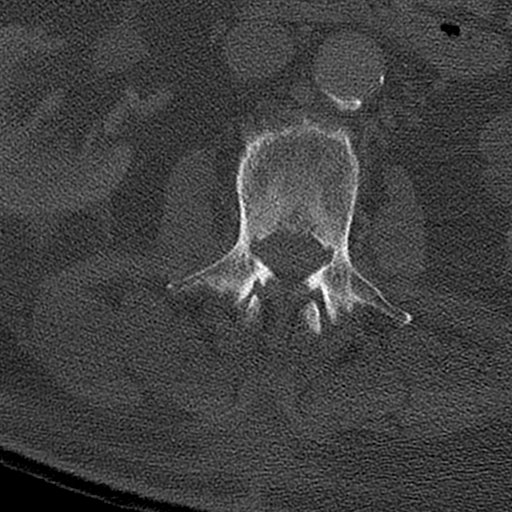

[Series 3: coronal l-spine bone · coronal · 0.38mm/px · 3 of 96 slices shown]
[im 20/96  bone]
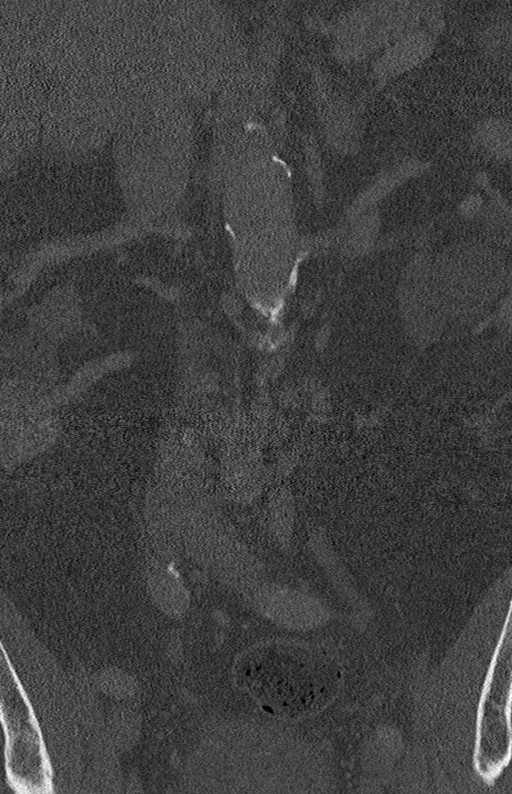
[im 39/96  bone]
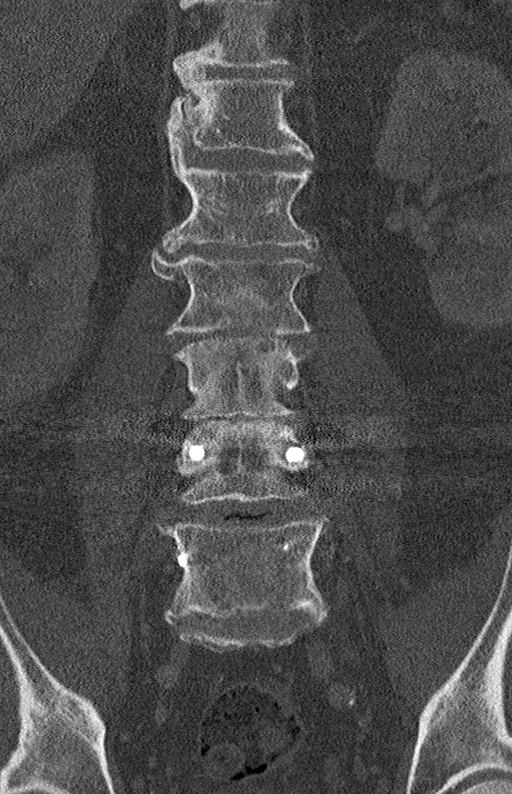
[im 58/96  bone]
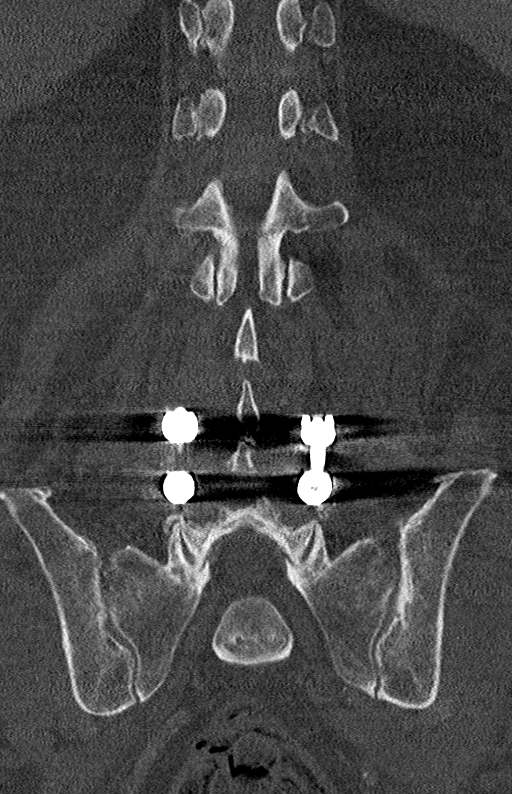

[Series 4: sagittall-spine bone · sagittal · 0.36mm/px · 5 of 84 slices shown, 6 images]
[im 28/84  bone]
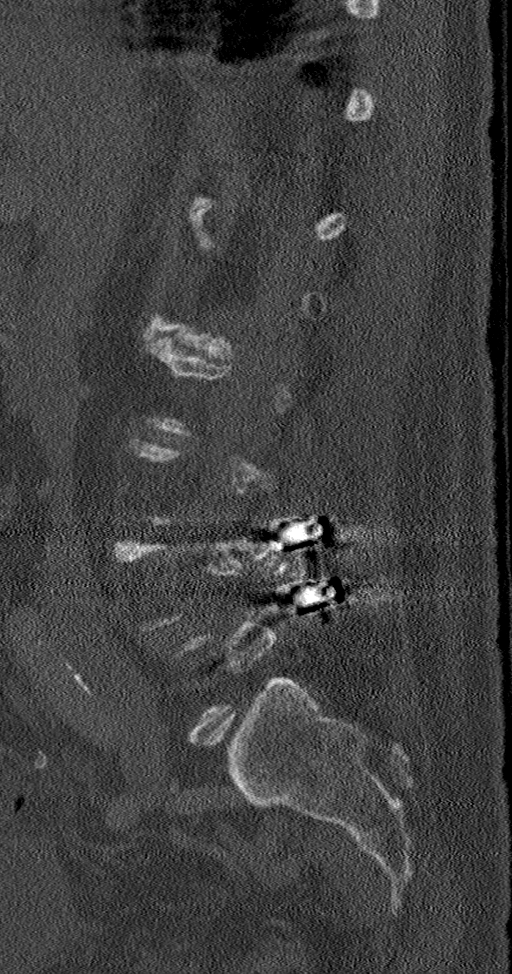
[im 35/84  bone]
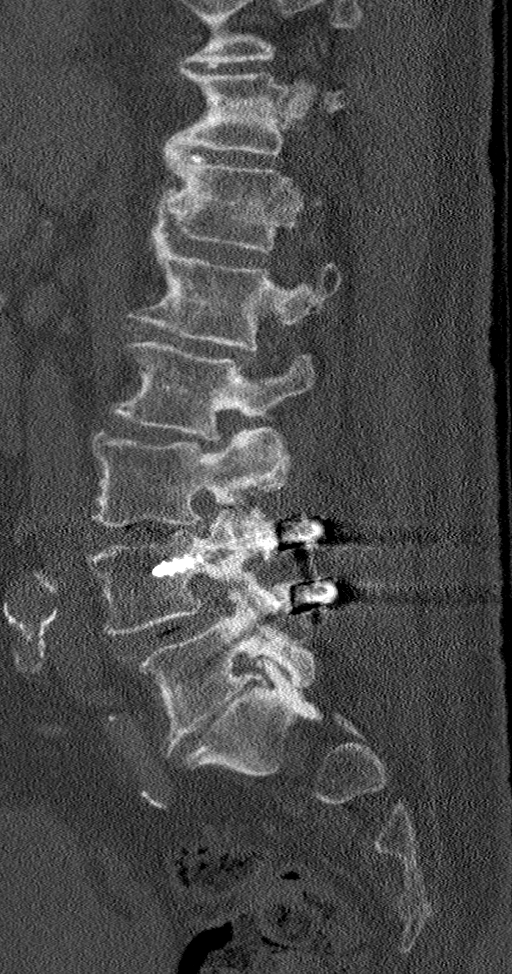
[im 42/84  soft-tissue]
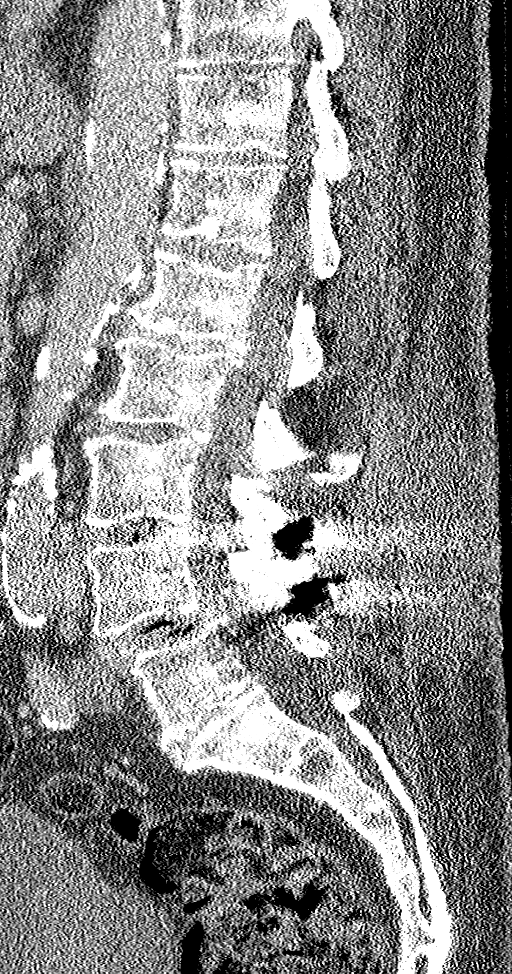
[im 42/84  bone]
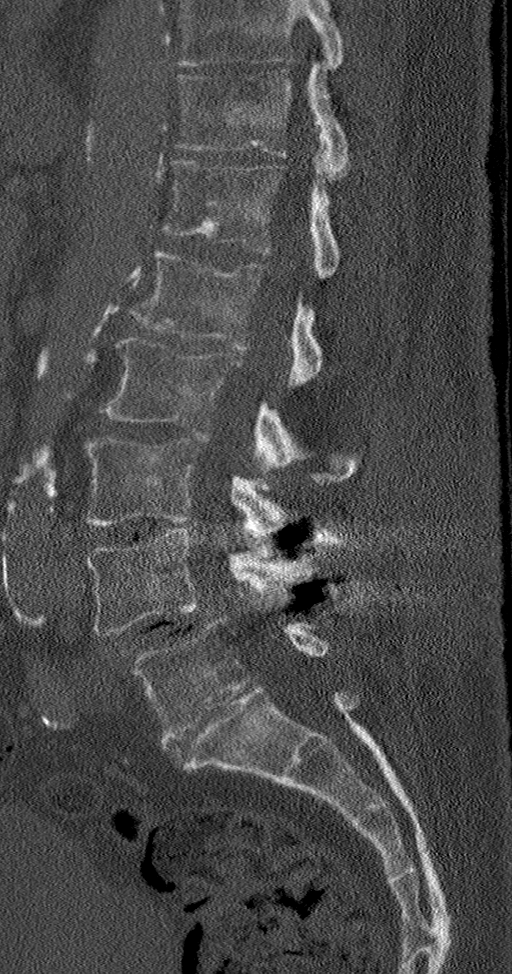
[im 49/84  bone]
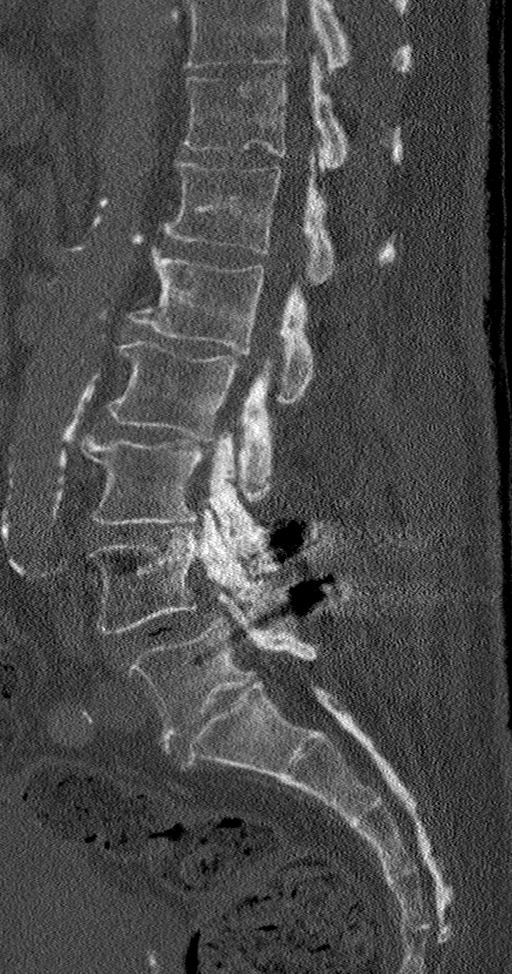
[im 56/84  bone]
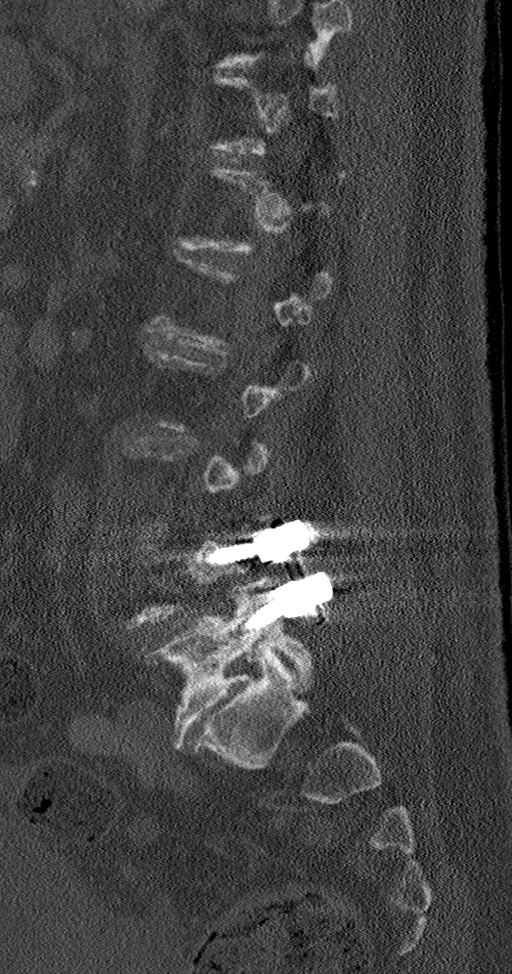

[10 of 33 positions shown; findings below may reference images not displayed]

FINDINGS: Segmentation: 5 non rib bearing segments of the lumbar spine.

Alignment: Posterior lumbar fusion with instrumentation of L4-5 is
been performed with bilateral pedicle screws and posterior bars.
There is grade 1 anterolisthesis of L4 upon L5. Partial resection of
the spinous process of L4 has been performed. Minimal retrolisthesis
of L1 upon L2 and L2 upon L3 is likely degenerative in nature

Vertebrae: No acute fracture or focal pathologic process identified.

Paraspinal and other soft tissues: Negative.

Disc levels: Review of the sagittal reformats demonstrates normal
lumbar lordosis. There is diffuse intervertebral disc space
narrowing and endplate remodeling throughout the lumbar spine, most
severe at L3-4 and L4-5 and L5-S1 with vacuum disc phenomena noted
at these levels. Vertebral body height has been preserved. Review of
the axial images demonstrates:

T12-L1: No canal stenosis or neural foraminal narrowing. No
significant facet arthrosis.

L1-2: Mild facet arthrosis. Posterior disc osteophyte complex
results in mild bilateral neural foraminal narrowing.

L2-3: Mild bilateral facet arthrosis. No significant neural
foraminal narrowing. No significant canal stenosis.

L3-4: Marked bilateral facet arthrosis. No significant canal
stenosis. At least moderate central canal stenosis, not well
evaluated on this non myelographic study.

L4-5: No significant neural foraminal narrowing.  No canal stenosis.

L5-S1: Mild to moderate bilateral neural foraminal narrowing
secondary to posterior disc osteophyte complex. No canal stenosis.
IMPRESSION: No acute fracture or traumatic listhesis.

Status post L4-5 posterior lumbar fusion with instrumentation with
partial resection of the L4 spinous process.

Moderate central canal stenosis at L3-4, not well characterized on
this examination. CT myelography may be helpful to assess the degree
of central canal stenosis.

## 2022-10-19 ENCOUNTER — Telehealth: Payer: Self-pay

## 2022-10-19 NOTE — Patient Outreach (Signed)
  Care Coordination   Initial Visit Note   10/19/2022 Name: Kyle Torres MRN: [id number] DOB: [birth year]  Kyle Torres is a 88 y.o. year old male who sees Cox, Kirsten, MD for primary care. I spoke with  Ricci Barker by phone today.  What matters to the patients health and wellness today?  Placed call to patient today and reviewed [facility] care coordination program.  Patient reports that he is doing well.  Over due to OV.     Goals Addressed               This Visit's Progress     COMPLETED: Need AWV (pt-stated)        Care Coordination Interventions: Patient confirmed that he has not had his AWV. Encouraged patient to call the office and schedule appointment.Marland Kitchen He voiced understanding.         SDOH assessments and interventions completed:  No     Care Coordination Interventions Activated:  Yes  Care Coordination Interventions:  Yes, provided   Follow up plan: No further intervention required.   Encounter Outcome:  Pt. Refused   Tomasa Rand, RN, BSN, CEN [facility] [employer] [phone]

## 2022-10-21 ENCOUNTER — Telehealth: Payer: Self-pay

## 2022-10-21 MED ORDER — AMLODIPINE BESYLATE 5 MG PO TABS: 5.0000 mg | ORAL_TABLET | Freq: Every day | ORAL | 1 refills | Status: DC

## 2022-10-21 NOTE — Telephone Encounter (Signed)
refill 

## 2022-10-23 NOTE — Progress Notes (Unsigned)
Subjective:  Patient ID: Kyle Torres, male    DOB: 1934-02-05  Age: 88 y.o. MRN: [id number]  CC: HTN HLD GERD  HPI  Hypertension, follow-up:  He was last seen for hypertension 1 years ago.  BP at that visit was 110/50. Management includes Norvasc 5 mg QD.  He reports {excellent/good/fair/poor:19665} compliance with treatment. He {is/is not:9024} having side effects. {document side effects if present:1} He is following a {diet:21022986} diet. He {is/is not:9024} exercising. He {does/does not:200015} smoke.  Use of agents associated with hypertension: {bp agents assoc with hypertension:511::"none"}.   Outside blood pressures are {***enter patient reported home BP readings, or 'not being checked':1}.  Pertinent labs: Lab Results  Component Value Date   CHOL 104 05/04/2021   HDL 53 05/04/2021   LDLCALC 38 05/04/2021   TRIG 59 05/04/2021   CHOLHDL 2.0 05/04/2021   Lab Results  Component Value Date   NA 138 05/04/2021   K 4.6 05/04/2021   CREATININE 0.91 05/04/2021   EGFR 83 05/04/2021   GFRNONAA 80 02/03/2021   GLUCOSE 80 05/04/2021     The ASCVD Risk score (Arnett DK, et al., 2019) failed to calculate for the following reasons:   The 2019 ASCVD risk score is only valid for ages 88 to 51    Lipid/Cholesterol, Follow-up  Last lipid panel Other pertinent labs  Lab Results  Component Value Date   CHOL 104 05/04/2021   HDL 53 05/04/2021   LDLCALC 38 05/04/2021   TRIG 59 05/04/2021   CHOLHDL 2.0 05/04/2021   Lab Results  Component Value Date   ALT 22 05/04/2021   AST 24 05/04/2021   PLT 249 05/04/2021   TSH 1.630 02/03/2021     He was last seen for this 1 years ago.  Management includes Crestor 20 mg QD.  He reports {excellent/good/fair/poor:19665} compliance with treatment. He {is/is not:9024} having side effects. {document side effects if present:1}  Current diet: {diet habits:16563} Current exercise: {exercise types:16438}     GERD, Follow  up:  The patient was last seen for GERD 1 years ago. Current treatment consist RX:VQMGQQPYPP 20 mg QD  He reports {excellent/good/fair/poor:19665} compliance with treatment. He {is/is not:21021397} having side effects. ***.  He IS experiencing {Sx; ge reflux:19417}. He is NOT experiencing {Sx; ge reflux:19417:o}  Current Outpatient Medications on File Prior to Visit  Medication Sig Dispense Refill   albuterol (PROVENTIL HFA;VENTOLIN HFA) 108 (90 BASE) MCG/ACT inhaler Inhale 1 puff into the lungs every 6 (six) hours as needed. FOR SHORTNESS OF BREATH     amLODipine (NORVASC) 5 MG tablet Take 1 tablet (5 mg total) by mouth daily. 90 tablet 1   aspirin EC 81 MG tablet Take 1 tablet (81 mg total) by mouth daily. 90 tablet 3   finasteride (PROSCAR) 5 MG tablet Take 5 mg by mouth daily.     fluticasone (FLONASE) 50 MCG/ACT nasal spray Place into both nostrils daily.     mometasone (ASMANEX) 220 MCG/INH inhaler Inhale 1 puff into the lungs 2 (two) times daily.     Multiple Vitamin (MULTIVITAMIN) tablet Take 1 tablet by mouth daily.     omeprazole (PRILOSEC) 20 MG capsule Take 20 mg by mouth 2 (two) times daily.     rosuvastatin (CRESTOR) 20 MG tablet Take 1 tablet (20 mg total) by mouth daily. 90 tablet 0   terazosin (HYTRIN) 5 MG capsule Take 5 mg by mouth 2 (two) times daily.     No current facility-administered medications on file  prior to visit.   Past Medical History:  Diagnosis Date   Arthritis    Asthma    BPH (benign prostatic hypertrophy)    Essential hypertension, malignant 04/22/2020   GERD (gastroesophageal reflux disease)    Hiatal hernia neck pain and back pain    Reflux    [facility]    [place] 02[place]   rt hand has difficulty with fine movements.   Past Surgical History:  Procedure Laterality Date   ANTERIOR CERVICAL DECOMP/DISCECTOMY FUSION  10/19/2011   Procedure: ANTERIOR CERVICAL DECOMPRESSION/DISCECTOMY FUSION 2 LEVELS;   Surgeon: JOphelia Charter  [facility];  Service: Neurosurgery;  Laterality: N/A;  CERVICAL FIVE-SIX, SIX-SEVEN ANTERIOR CERVICAL DECOMPRESSION WITH FUSION AND  INTERBODY PROTHESIS PLATING   CHOLECYSTECTOMY  2010   EYE SURGERY     LUMBAR SPINE SURGERY  05/2020   L4-5 LAMINECTOMY AND FUSION W/ PEDICLE SCREWS, TLIF, POSSIBLE ALLOGRAFT & BONE MARROW ASPIRATION. 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  Tobacco Use   Smoking status: Former    Types: Cigarettes    Quit date: 12/13/1975    Years since quitting: 46.8   Smokeless tobacco: Former    Quit date: 10/10/1983  Substance and Sexual Activity   Alcohol use: No   Drug use: No   Sexual activity: Not on file  Other Topics Concern   Not on file  Social History Narrative   Not on file   Social Determinants of Health   Financial Resource Strain[place] (11/02/2021[place]   Overall Financial Resource Strain (CARDIA)    Difficulty of Paying Living Expenses: Not hard at all  Food Insecurity: [place](11/02/2021)   Hunger Vital Sign    Worried About Running Out of Food in the Last Year: Never true    [place] the Last Year: Never true  Transportation Needs: No Transportation Needs (11/02/2021)   PRAPARE - THydrologist(Medical): No    Lack of Transportation (Non-Medical): No  Physical Activity: Inactive (11/02/2021)   Exercise Vital Sign    Days of Exercise per Week: 0 days     Minutes of Exercise per Session: 0 min  Stress: No Stress Concern Present (11/02/2021)   [place]   Feeling of Stress : Not at all  Social Connections: Socially Isolated (11/02/2021)   Social Connection and Isolation Panel [NHANES]    Frequency of Communication with Friends and Family: More than three times a week    Frequency of Social Gatherings with Friends and Family: More than three times a week    Attends Religious Services: Never    AMarine scientistor Organizations: No    Attends CArchivistMeetings: Never  Marital Status: Widowed    Review of Systems   Objective:  There were no vitals taken for this visit.     11/02/2021    8:36 PM 05/04/2021   10:23 AM 02/22/2021   10:01 AM  BP/Weight  Systolic BP 997 741 423  Diastolic BP 50 60 68  Wt. (Lbs) 211 203 207  BMI 30.28 kg/m2 29.13 kg/m2 29.7 kg/m2    Physical Exam  Diabetic Foot Exam - Simple   No data filed      Lab Results  Component Value Date   WBC 9.3 05/04/2021   HGB 12.1 (L) 05/04/2021   HCT 36.8 (L) 05/04/2021   PLT 249 05/04/2021   GLUCOSE 80 05/04/2021   CHOL 104 05/04/2021   TRIG 59 05/04/2021   HDL 53 05/04/2021   LDLCALC 38 05/04/2021   ALT 22 05/04/2021   AST 24 05/04/2021   NA 138 05/04/2021   K 4.6 05/04/2021   CL 101 05/04/2021   CREATININE 0.91 05/04/2021   BUN 16 05/04/2021   CO2 23 05/04/2021   TSH 1.630 02/03/2021   INR 1.2 10/13/2020      Assessment & Plan:   Problem List Items Addressed This Visit       Cardiovascular and Mediastinum   AAA (abdominal aortic aneurysm) (HCC)     Respiratory   OSA (obstructive sleep apnea)     Genitourinary   Bladder neoplasm   Other Visit Diagnoses     Mixed hyperlipidemia    -  Primary   Essential hypertension       Atherosclerosis of aorta ([place])         .  No orders of the defined types were placed in this encounter.   No orders  of the defined types were placed in this encounter.    Follow-up: No follow-ups on file.  An After Visit Summary was printed and given to the patient.  Rip Harbour, NP [place] [phone]

## 2022-10-24 ENCOUNTER — Encounter: Payer: Self-pay | Admitting: Family Medicine

## 2022-10-24 ENCOUNTER — Ambulatory Visit (INDEPENDENT_AMBULATORY_CARE_PROVIDER_SITE_OTHER): Payer: PPO | Admitting: Family Medicine

## 2022-10-24 VITALS — BP 124/70 | HR 65 | Temp 97.5°F | Ht 70.0 in | Wt 197.0 lb

## 2022-10-24 DIAGNOSIS — I10 Essential (primary) hypertension: Secondary | ICD-10-CM | POA: Diagnosis not present

## 2022-10-24 DIAGNOSIS — R269 Unspecified abnormalities of gait and mobility: Secondary | ICD-10-CM

## 2022-10-24 DIAGNOSIS — G4733 Obstructive sleep apnea (adult) (pediatric): Secondary | ICD-10-CM

## 2022-10-24 DIAGNOSIS — D494 Neoplasm of unspecified behavior of bladder: Secondary | ICD-10-CM | POA: Diagnosis not present

## 2022-10-24 DIAGNOSIS — Z23 Encounter for immunization: Secondary | ICD-10-CM

## 2022-10-24 DIAGNOSIS — I714 Abdominal aortic aneurysm, without rupture, unspecified: Secondary | ICD-10-CM | POA: Diagnosis not present

## 2022-10-24 DIAGNOSIS — N401 Enlarged prostate with lower urinary tract symptoms: Secondary | ICD-10-CM | POA: Diagnosis not present

## 2022-10-24 DIAGNOSIS — I69398 Other sequelae of cerebral infarction: Secondary | ICD-10-CM

## 2022-10-24 DIAGNOSIS — E782 Mixed hyperlipidemia: Secondary | ICD-10-CM | POA: Diagnosis not present

## 2022-10-24 DIAGNOSIS — D6489 Other specified anemias: Secondary | ICD-10-CM | POA: Diagnosis not present

## 2022-10-24 DIAGNOSIS — I700 Atherosclerosis of aorta: Secondary | ICD-10-CM

## 2022-10-24 NOTE — Patient Instructions (Signed)
Recommend shingles vaccine series at the pharmacy.

## 2022-10-24 NOTE — Assessment & Plan Note (Signed)
Well controlled.  No changes to medicines. Amlodipine 5 mg daily. Continue to work on eating a healthy diet and exercise.  Labs drawn today.

## 2022-10-24 NOTE — Assessment & Plan Note (Signed)
The current medical regimen is effective;  continue present plan and medications. Continue proscar and terazosin.

## 2022-10-24 NOTE — Assessment & Plan Note (Signed)
Obtain records to confirm dx.

## 2022-10-24 NOTE — Progress Notes (Signed)
Subjective:  Patient ID: Kyle Torres, male    DOB: Aug 25, 1934  Age: 88 y.o. MRN: [id number]  Chief Complaint  Patient presents with   Hypertension    HPI Patient is a 88 year old white male with history of a stroke, hyperlipidemia, hypertension and GERD.    Hyperlipidemia: Currently on Crestor 20 mg daily. Patient has lost 30 lbs since his wife passed. He is now eating 2 meals per day. Eats sausage gravy biscuit 5 days a week. Eggs, slice of tomato on the 6th day. For supper has beans, potato salad, and slice of fried cornbread. Does not eat a lot of meat. Sometimes eats a frozen meal.  His last lipid panel was at goal.    Hypertension: Currently on amlodipine 5 mg daily. Blood pressure ranges from 130-145/60-80.  Denies chest pain or shortness of breath.  History of a stroke.  Patient has mild ataxia still.  He currently takes aspirin and his statin medicines.  GERD: Currently on omeprazole 20 mg twice daily.  Has not been able to cut it down.   Anemia: Mild.  We will recheck today.  Was normocytic in the past.  Asthma: Currently on Asmanex  one inhalation daily.  Takes albuterol as needed.  Also is on Flonase nasal spray.  BPH: On Proscar and terazosin. No difficulty urinating. Has to get up once at night.   OSA: not using cpap. Just never restarted after moving homes.   Patient has an odd history of bladder cancer that he is unaware of.  It appears that Dr. Nila Nephew did a biopsy during cystoscopy of a lesion.  I do not have those results.  We will request them today.  Current Outpatient Medications on File Prior to Visit  Medication Sig Dispense Refill   albuterol (PROVENTIL HFA;VENTOLIN HFA) 108 (90 BASE) MCG/ACT inhaler Inhale 1 puff into the lungs every 6 (six) hours as needed. FOR SHORTNESS OF BREATH     amLODipine (NORVASC) 5 MG tablet Take 1 tablet (5 mg total) by mouth daily. 90 tablet 1   aspirin EC 81 MG tablet Take 1 tablet (81 mg total) by mouth daily. 90 tablet 3    finasteride (PROSCAR) 5 MG tablet Take 5 mg by mouth daily.     fluticasone (FLONASE) 50 MCG/ACT nasal spray Place into both nostrils daily.     mometasone (ASMANEX) 220 MCG/INH inhaler Inhale 1 puff into the lungs 2 (two) times daily.     Multiple Vitamin (MULTIVITAMIN) tablet Take 1 tablet by mouth daily.     omeprazole (PRILOSEC) 20 MG capsule Take 20 mg by mouth 2 (two) times daily.     rosuvastatin (CRESTOR) 20 MG tablet Take 1 tablet (20 mg total) by mouth daily. 90 tablet 0   terazosin (HYTRIN) 5 MG capsule Take 5 mg by mouth 2 (two) times daily.     No current facility-administered medications on file prior to visit.   Past Medical History:  Diagnosis Date   Arthritis    Asthma    BPH (benign prostatic hypertrophy)    Essential hypertension, malignant 04/22/2020   GERD (gastroesophageal reflux disease)    Hiatal hernia neck pain and back pain    Reflux    Sepsis secondary to UTI ([place]) 10/14/2020   SIRS (systemic inflammatory response syndrome) ([place]) 10/14/2020   [facility]    [place] [place]   rt hand has difficulty with fine movements.   Past Surgical History:  Procedure Laterality Date   ANTERIOR CERVICAL DECOMP/DISCECTOMY FUSION  10/19/2011   Procedure: ANTERIOR CERVICAL DECOMPRESSION/DISCECTOMY FUSION 2 LEVELS;  Surgeon: Ophelia Charter;  Location: [place] [facility];  Service: Neurosurgery;  Laterality: N/A;  CERVICAL FIVE-SIX, SIX-SEVEN ANTERIOR CERVICAL DECOMPRESSION WITH FUSION AND  INTERBODY PROTHESIS PLATING   CHOLECYSTECTOMY  2010   EYE SURGERY     LUMBAR SPINE SURGERY  05/2020   L4-5 LAMINECTOMY AND FUSION W/ PEDICLE SCREWS, TLIF, POSSIBLE ALLOGRAFT & BONE MARROW ASPIRATION. 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  Tobacco Use   Smoking status: Former    Types: Cigarettes    Quit date: 12/13/1975    Years since quitting: 46.8   Smokeless tobacco: Former    Quit date: 10/10/1983  Substance and Sexual Activity   Alcohol use: No   Drug use: No   Sexual activity: Not on file  Other Topics Concern   Not on file  Social History Narrative   Not on file   Social Determinants of Health   Financial Resource Strain: [place]  (11/02/2021)   Overall Financial Resource Strain (CARDIA)    Difficulty of Paying Living Expenses: Not hard at all  Food Insecurity: No [place] (11/02/2021)   Hunger Vital Sign    Worried About Running Out of Food in the Last Year: Never true    [place] in the Last Year: Never true  Transportation Needs: No Transportation Needs (11/02/2021)   PRAPARE - Hydrologist (Medical): No    Lack of Transportation (Non-Medical): No  Physical Activity: Inactive (11/02/2021)   Exercise Vital Sign    Days of Exercise per Week: 0 days    Minutes of Exercise per Session: 0 min  Stress: No Stress Concern Present (11/02/2021)   [place]    Feeling of Stress : Not at all  Social Connections: Socially Isolated (11/02/2021)   Social Connection and Isolation Panel [NHANES]    Frequency of Communication with Friends and Family: More than three times a week    Frequency of Social Gatherings with Friends and Family: More than three times a week    Attends Religious Services: Never    Marine scientist or Organizations: No    Attends Archivist Meetings: Never    Marital  Status: Widowed    Review of Systems  Constitutional:  Negative for chills, fatigue, fever and unexpected weight change.  HENT:  Negative for congestion, ear pain, sinus pain and sore throat.   Respiratory:  Negative for cough and shortness of breath.   Cardiovascular:  Negative for chest pain and palpitations.  Gastrointestinal:  Negative for abdominal pain, blood in stool, constipation, diarrhea, nausea and vomiting.  Endocrine: Negative for polydipsia.  Genitourinary:  Negative for dysuria.  Musculoskeletal:  Negative  for back pain.  Skin:  Negative for rash.  Neurological:  Negative for headaches.       Balance issues since stroke. One fall in the last year. No injuries.      Objective:  BP 124/70 (BP Location: Right Arm, Patient Position: Sitting, Cuff Size: Normal)   Pulse 65   Temp (!) 97.5 F (36.4 C) (Temporal)   Ht '5\' 10"'$  (1.778 m)   Wt 197 lb (89.4 kg)   SpO2 93%   BMI 28.27 kg/m      10/24/2022    9:31 AM 11/02/2021    8:36 PM 05/04/2021   10:23 AM  BP/Weight  Systolic BP 782 956 213  Diastolic BP 70 50 60  Wt. (Lbs) 197 211 203  BMI 28.27 kg/m2 30.28 kg/m2 29.13 kg/m2    Physical Exam Constitutional:      Appearance: Normal appearance. He is normal weight.  Neck:     Vascular: No carotid bruit.  Cardiovascular:     Rate and Rhythm: Normal rate and regular rhythm.     Pulses: Normal pulses.     Heart sounds: Normal heart sounds.  Pulmonary:     Effort: Pulmonary effort is normal.     Breath sounds: Normal breath sounds.  Abdominal:     General: Bowel sounds are normal.     Palpations: Abdomen is soft. There is no mass.     Tenderness: There is no abdominal tenderness.  Neurological:     Mental Status: He is alert and oriented to person, place, and time.     Motor: No weakness.     Gait: Gait abnormal (uses a cane).  Psychiatric:        Mood and Affect: Mood normal.        Behavior: Behavior normal.        Thought Content: Thought content  normal.     Diabetic Foot Exam - Simple   No data filed      Lab Results  Component Value Date   WBC 9.3 05/04/2021   HGB 12.1 (L) 05/04/2021   HCT 36.8 (L) 05/04/2021   PLT 249 05/04/2021   GLUCOSE 80 05/04/2021   CHOL 104 05/04/2021   TRIG 59 05/04/2021   HDL 53 05/04/2021   LDLCALC 38 05/04/2021   ALT 22 05/04/2021   AST 24 05/04/2021   NA 138 05/04/2021   K 4.6 05/04/2021   CL 101 05/04/2021   CREATININE 0.91 05/04/2021   BUN 16 05/04/2021   CO2 23 05/04/2021   TSH 1.630 02/03/2021   INR 1.2 10/13/2020      Assessment & Plan:   Problem List Items Addressed This Visit       Cardiovascular and Mediastinum   Essential hypertension    Well controlled.  No changes to medicines. Amlodipine 5 mg daily. Continue to work on eating a healthy diet and exercise.  Labs drawn today.        Relevant Orders   CBC with Differential/Platelet   Comprehensive metabolic panel   AAA (abdominal aortic aneurysm) (HCC)    3.0 cm fusiform infrarenal abdominal aortic aneurysm Recommend follow-up ultrasound every 3 years. Due for recheck in 2024.      Relevant Orders   CBC with Differential/Platelet   Comprehensive metabolic panel   Atherosclerosis of aorta (HCC)    Continue asprin 81 mg daily and crestor 20 mg before bed.       Relevant Orders   CBC with Differential/Platelet   Comprehensive  metabolic panel   Lipid panel     Respiratory   OSA (obstructive sleep apnea)    Noncompliant with cpap. Has lost a significant amount of weight.  Repeat home sleep study.       Relevant Orders   Home sleep test     Genitourinary   Bladder neoplasm    Obtain records to confirm dx.       Benign prostatic hyperplasia with lower urinary tract symptoms    The current medical regimen is effective;  continue present plan and medications. Continue proscar and terazosin.      Relevant Orders   CBC with Differential/Platelet   Comprehensive metabolic panel   PSA      Other   Encounter for immunization   Relevant Orders   Pfizer Fall 2023 Covid-19 Vaccine 84yr and older (Completed)   Mixed hyperlipidemia - Primary    Well controlled.  No changes to medicines. Continue crestor 40 mg before bed. Continue to work on eating a healthy diet and exercise.  Labs drawn today.        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 Control hypertension with amlodipine.      .  No orders of the defined types were placed in this encounter.   Orders Placed This Encounter  Procedures   Flu Vaccine QUAD High Dose(Fluad)   Pfizer Fall 2023 Covid-19 Vaccine 134yrand older   CBC with Differential/Platelet   Comprehensive metabolic panel   Lipid panel   PSA   Home sleep test     Follow-up: Return in about 6 months (around 04/24/2023) for chronic fasting.  An After Visit Summary was printed and given to the patient.  MaThompson Caulacting as a scEducation administratoror KiRochel BromeMD.,have documented all relevant documentation on the behalf of KiRochel BromeMD,as directed by  KiRochel BromeMD while in the presence of KiRochel BromeMD.   KiRochel BromeMD [phone]

## 2022-10-24 NOTE — Assessment & Plan Note (Signed)
Well controlled.  No changes to medicines. Continue crestor 40 mg before bed. Continue to work on eating a healthy diet and exercise.  Labs drawn today.

## 2022-10-24 NOTE — Assessment & Plan Note (Addendum)
Continue asprin 81 mg daily and crestor 20 mg before bed.

## 2022-10-24 NOTE — Assessment & Plan Note (Signed)
Continue aspirin, crestor. Control hypertension with amlodipine.

## 2022-10-24 NOTE — Assessment & Plan Note (Addendum)
3.0 cm fusiform infrarenal abdominal aortic aneurysm Recommend follow-up ultrasound every 3 years. Due for recheck in 2024.

## 2022-10-24 NOTE — Assessment & Plan Note (Addendum)
Noncompliant with cpap. Has lost a significant amount of weight.  Repeat home sleep study.

## 2022-10-25 LAB — CBC WITH DIFFERENTIAL/PLATELET
Basophils Absolute: 0 10*3/uL (ref 0.0–0.2)
Basos: 0 %
EOS (ABSOLUTE): 0.2 10*3/uL (ref 0.0–0.4)
Eos: 3 %
Hematocrit: 38.4 % (ref 37.5–51.0)
Hemoglobin: 12.8 g/dL — ABNORMAL LOW (ref 13.0–17.7)
Immature Grans (Abs): 0 10*3/uL (ref 0.0–0.1)
Immature Granulocytes: 0 %
Lymphocytes Absolute: 2.3 10*3/uL (ref 0.7–3.1)
Lymphs: 31 %
MCH: 31.8 pg (ref 26.6–33.0)
MCHC: 33.3 g/dL (ref 31.5–35.7)
MCV: 96 fL (ref 79–97)
Monocytes Absolute: 0.6 10*3/uL (ref 0.1–0.9)
Monocytes: 9 %
Neutrophils Absolute: 4.3 10*3/uL (ref 1.4–7.0)
Neutrophils: 57 %
Platelets: 248 10*3/uL (ref 150–450)
RBC: 4.02 x10E6/uL — ABNORMAL LOW (ref 4.14–5.80)
RDW: 12.2 % (ref 11.6–15.4)
WBC: 7.5 10*3/uL (ref 3.4–10.8)

## 2022-10-25 LAB — COMPREHENSIVE METABOLIC PANEL
ALT: 21 IU/L (ref 0–44)
AST: 27 IU/L (ref 0–40)
Albumin/Globulin Ratio: 1.3 (ref 1.2–2.2)
Albumin: 3.9 g/dL (ref 3.7–4.7)
Alkaline Phosphatase: 63 IU/L (ref 44–121)
BUN/Creatinine Ratio: 14 (ref 10–24)
BUN: 10 mg/dL (ref 8–27)
Bilirubin Total: 0.5 mg/dL (ref 0.0–1.2)
CO2: 24 mmol/L (ref 20–29)
Calcium: 9.1 mg/dL (ref 8.6–10.2)
Chloride: 103 mmol/L (ref 96–106)
Creatinine, Ser: 0.74 mg/dL — ABNORMAL LOW (ref 0.76–1.27)
Globulin, Total: 3.1 g/dL (ref 1.5–4.5)
Glucose: 96 mg/dL (ref 70–99)
Potassium: 4.5 mmol/L (ref 3.5–5.2)
Sodium: 138 mmol/L (ref 134–144)
Total Protein: 7 g/dL (ref 6.0–8.5)
eGFR: 88 mL/min/{1.73_m2} (ref >59)

## 2022-10-25 LAB — LIPID PANEL
Chol/HDL Ratio: 1.8 ratio (ref 0.0–5.0)
Cholesterol, Total: 114 mg/dL (ref 100–199)
HDL: 63 mg/dL (ref >39)
LDL Chol Calc (NIH): 39 mg/dL (ref 0–99)
Triglycerides: 49 mg/dL (ref 0–149)
VLDL Cholesterol Cal: 12 mg/dL (ref 5–40)

## 2022-10-25 LAB — CARDIOVASCULAR RISK ASSESSMENT

## 2022-10-25 LAB — PSA: Prostate Specific Ag, Serum: 1.5 ng/mL (ref 0.0–4.0)

## 2022-10-28 LAB — B12 AND FOLATE PANEL
Folate: >20 ng/mL (ref >3.0)
Vitamin B-12: 516 pg/mL (ref 232–1245)

## 2022-10-28 LAB — SPECIMEN STATUS REPORT

## 2022-10-30 NOTE — Progress Notes (Signed)
B12 and folate normal.

## 2022-11-10 ENCOUNTER — Ambulatory Visit (INDEPENDENT_AMBULATORY_CARE_PROVIDER_SITE_OTHER): Payer: PPO

## 2022-11-10 VITALS — BP 122/74 | HR 68 | Resp 16 | Ht 70.0 in | Wt 213.6 lb

## 2022-11-10 DIAGNOSIS — Z0000 Encounter for general adult medical examination without abnormal findings: Secondary | ICD-10-CM | POA: Diagnosis not present

## 2022-11-11 NOTE — Progress Notes (Signed)
Subjective:   Kyle Torres is a 88 y.o. male who presents for Medicare Annual/Subsequent preventive examination.  This wellness visit is conducted by a nurse.  The patient's medications were reviewed and reconciled since the patient's last visit.  History details were provided by the patient.  The history appears to be reliable.    Medical History: Patient history and Family history was reviewed  Medications, Allergies, and preventative health maintenance was reviewed and updated.  Cardiac Risk Factors include: advanced age (>48mn, >>8women);male gender;dyslipidemia;hypertension     Objective:    Today's Vitals   11/11/22 1049  BP: 122/74  Pulse: 68  Resp: 16  Weight: 213 lb 9.6 oz (96.9 kg)  Height: '5\' 10"'$  (1.778 m)   Body mass index is 30.65 kg/m.     11/02/2021    9:46 AM 10/14/2020    8:05 AM 10/13/2020   10:02 PM 10/10/2011    1:06 PM  Advanced Directives  Does Patient Have a Medical Advance Directive? Yes Yes No Patient has advance directive, copy not in chart  Type of Advance Directive Healthcare Power of [place] will  [place]  Does patient want to make changes to medical advance directive?  No - Patient declined    Copy of [place] Chart? No - copy requested No - copy requested      Current Medications (verified) Outpatient Encounter Medications as of 11/10/2022  Medication Sig   albuterol (PROVENTIL HFA;VENTOLIN HFA) 108 (90 BASE) MCG/ACT inhaler Inhale 1 puff into the lungs every 6 (six) hours as needed. FOR SHORTNESS OF BREATH   amLODipine (NORVASC) 5 MG tablet Take 1 tablet (5 mg total) by mouth daily.   aspirin EC 81 MG tablet Take 1 tablet (81 mg total) by mouth daily.   finasteride (PROSCAR) 5 MG tablet Take 5 mg by mouth daily.   fluticasone (FLONASE) 50 MCG/ACT nasal spray Place into both nostrils daily.   mometasone (ASMANEX) 220 MCG/INH inhaler Inhale 1  puff into the lungs 2 (two) times daily.   Multiple Vitamin (MULTIVITAMIN) tablet Take 1 tablet by mouth daily.   omeprazole (PRILOSEC) 20 MG capsule Take 20 mg by mouth 2 (two) times daily.   rosuvastatin (CRESTOR) 20 MG tablet Take 1 tablet (20 mg total) by mouth daily.   terazosin (HYTRIN) 5 MG capsule Take 5 mg by mouth 2 (two) times daily.   No facility-administered encounter medications on file as of 11/10/2022.    Allergies (verified) Patient has no known allergies.   History: Past Medical History:  Diagnosis Date   Arthritis    Asthma    BPH (benign prostatic hypertrophy)    Essential hypertension, malignant 04/22/2020   GERD (gastroesophageal reflux disease)    Hiatal hernia neck pain and back pain    Reflux    Sepsis secondary to UTI ([place] 10/14/2020   SIRS (systemic inflammatory response syndrome) ([place] 10/14/2020   [facility] [place] [facility]    Stroke ([facility]   rt hand has difficulty with fine movements.   Past Surgical History:  Procedure Laterality Date   ANTERIOR CERVICAL DECOMP/DISCECTOMY FUSION  10/19/2011   Procedure: ANTERIOR CERVICAL DECOMPRESSION/DISCECTOMY FUSION 2 LEVELS;  Surgeon: JeOphelia Charter [facility];  Service: Neurosurgery;  Laterality: N/A;  CERVICAL FIVE-SIX, SIX-SEVEN ANTERIOR CERVICAL DECOMPRESSION WITH FUSION AND  INTERBODY PROTHESIS PLATING   CHOLECYSTECTOMY  2010   EYE SURGERY  LUMBAR SPINE SURGERY  05/2020   L4-5 LAMINECTOMY AND FUSION W/ PEDICLE SCREWS, TLIF, POSSIBLE ALLOGRAFT & BONE MARROW ASPIRATION. 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  Tobacco Use   Smoking status: Former    Types: Cigarettes    Quit date: 12/13/1975    Years since quitting: 46.9   Smokeless tobacco: Former    Quit date: 10/10/1983  Vaping Use   Vaping Use: Never used  Substance and Sexual Activity   Alcohol use: No   Drug use: No   Sexual activity: Not on file  Other Topics Concern   Not on file  Social History Narrative   Not on file   Social Determinants of Health   Financial Resource Strain: Low Risk  (11/02/2021)   Overall Financial Resource Strain (CARDIA)    Difficulty of Paying Living Expenses: Not hard at all  Food Insecurity: No [place] (11/11/2022)   Hunger Vital Sign    Worried About Running Out of Food in the Last Year: Never true    [place] in the Last Year: Never true  Transportation Needs: No Transportation Needs (11/11/2022)   PRAPARE - Hydrologist (Medical): No    Lack of Transportation (Non-Medical): No  Physical Activity: Inactive (11/11/2022)   Exercise Vital Sign    Days of Exercise per Week: 0 days    Minutes of Exercise per Session: 0 min  Stress: No Stress Concern Present (11/11/2022)   [place]    Feeling of Stress : Not at all  Social Connections: Socially Isolated (11/02/2021)   Social Connection and Isolation Panel [NHANES]    Frequency of Communication with Friends and Family: More than three times a week    Frequency of Social Gatherings with Friends and Family: More than three times a week    Attends Religious Services: Never    Marine scientist or Organizations: No    Attends Archivist Meetings: Never    Marital Status: Widowed    Tobacco Counseling Counseling given: Not Answered   Clinical Intake: Pre-visit  preparation completed: Yes Pain : No/denies pain   BMI - recorded: 30.65 Nutritional Status: BMI > 30  Obese Nutritional Risks: None How often do you need to have someone help you when you read instructions, pamphlets, or other written materials from your doctor or pharmacy?: 1 - Never Interpreter Needed?: No    Activities of Daily Living    11/10/2022    2:44 PM 11/17/2021    8:29 PM  In your present state of health, do you have any difficulty performing the following activities:  Hearing? 1   Vision? 0   Difficulty concentrating or making decisions? 0 0  Walking or climbing stairs? 1   Dressing or bathing? 0   Doing errands,  shopping? 0   Preparing Food and eating ? N N  Using the Toilet? N N  In the past six months, have you accidently leaked urine? N Y  Do you have problems with loss of bowel control? N N  Managing your Medications? N N  Managing your Finances? N N  Housekeeping or managing your Housekeeping? N N    Patient Care Team: CoxElnita Maxwell, MD as PCP - General (Family Medicine)     Assessment:   This is a routine wellness examination for [place].  Hearing/Vision screen No results found.  Dietary issues and exercise activities discussed: Current Exercise Habits: The patient does not participate in regular exercise at present, Exercise limited by: None identified  Depression Screen    11/11/2022   11:03 AM 11/02/2021    8:11 PM 05/04/2021   10:25 AM 02/03/2021   10:25 AM 03/25/2020    1:27 PM  PHQ 2/9 Scores  PHQ - 2 Score 0 0 0 0 0    Fall Risk    11/11/2022   11:06 AM 11/17/2021    8:23 PM 05/04/2021   10:25 AM 02/03/2021   10:25 AM 03/25/2020    1:27 PM  Fall Risk   Falls in the past year? 0 1 0 1 0  Number falls in past yr: 0 1 0 1 0  Injury with Fall? 0 0 0 0 0  Risk for fall due to : History of fall(s) History of fall(s);Impaired balance/gait  History of fall(s);Impaired balance/gait   Follow up Falls evaluation completed;Falls prevention  discussed Falls evaluation completed;Education provided  Falls evaluation completed;Falls prevention discussed     FALL RISK PREVENTION PERTAINING TO THE HOME:  Any stairs in or around the home? Yes  If so, are there any without handrails? No  Home free of loose throw rugs in walkways, pet beds, electrical cords, etc? Yes  Adequate lighting in your home to reduce risk of falls? Yes   ASSISTIVE DEVICES UTILIZED TO PREVENT FALLS:  Use of a cane, walker or w/c? Yes  Grab bars in the bathroom? Yes  Shower chair or bench in shower? No  Elevated toilet seat or a handicapped toilet? No   Gait slow and steady with assistive device  Cognitive Function:    10/24/2022   12:01 PM  MMSE - Mini Mental State Exam  Orientation to time 5  Orientation to Place 5  Registration 3  Attention/ Calculation 5  Recall 3  Language- name 2 objects 2  Language- repeat 1  Language- follow 3 step command 3  Language- read & follow direction 1  Write a sentence 1  Copy design 1  Total score 30        Immunizations Immunization History  Administered Date(s) Administered   COVID-19, mRNA, vaccine(Comirnaty)12 years and older 10/24/2022   Fluad Quad(high Dose 65+) 08/24/2020, 10/04/2021, 10/24/2022   Influenza Split 08/12/2014   Influenza, High Dose Seasonal PF 08/27/2010, 08/26/2011, 08/27/2012, 09/11/2016, 09/11/2017, 09/11/2018   Influenza-Unspecified 01/12/2003, 10/03/2003, 09/11/2005, 08/18/2006, 08/13/2007, 08/12/2008, 08/12/2009, 09/13/2013, 09/12/2019, 08/12/2020   Moderna Covid-19 Vaccine Bivalent Booster 74yr & up 10/04/2021   Moderna SARS-COV2 Booster Vaccination 11/10/2020, 05/04/2021   Moderna Sars-Covid-2 Vaccination 02/06/2020, 03/09/2020, 11/10/2020, 02/05/2021   Pneumococcal Conjugate-13 03/12/2014   Pneumococcal Polysaccharide-23 08/24/2020   Pneumococcal-Unspecified 01/12/2003   Tdap 06/29/2016    TDAP status: Up to date  Flu Vaccine status: Up to date  Pneumococcal  vaccine status: Up to date  Covid-19 vaccine status: Completed vaccines  Qualifies  for Shingles Vaccine? Yes   Zostavax completed No   Shingrix Completed?: No.    Education has been provided regarding the importance of this vaccine. Patient has been advised to call insurance company to determine out of pocket expense if they have not yet received this vaccine. Advised may also receive vaccine at local pharmacy or Health Dept. Verbalized acceptance and understanding.  Screening Tests Health Maintenance  Topic Date Due   Zoster Vaccines- Shingrix (1 of 2) Never done   COVID-19 Vaccine (5 - 2023-24 season) 08/12/2022   Medicare Annual Wellness (AWV)  11/02/2022   DTaP/Tdap/Td (2 - Td or Tdap) 06/29/2026   Pneumonia Vaccine 55+ Years old  Completed   INFLUENZA VACCINE  Completed   HPV VACCINES  Aged Out    Health Maintenance  Health Maintenance Due  Topic Date Due   Zoster Vaccines- Shingrix (1 of 2) Never done   COVID-19 Vaccine (5 - 2023-24 season) 08/12/2022   Medicare Annual Wellness (AWV)  11/02/2022    Colorectal cancer screening: No longer required.   Lung Cancer Screening: (Low Dose CT Chest recommended if Age 62-80 years, 30 pack-year currently smoking OR have quit w/in 15years.) does not qualify.   Lung Cancer Screening Referral: N/A  Additional Screening:  Vision Screening: Recommended annual ophthalmology exams for early detection of glaucoma and other disorders of the eye. Is the patient up to date with their annual eye exam?  Yes   Dental Screening: Recommended annual dental exams for proper oral hygiene  Community Resource Referral / Chronic Care Management: CRR required this visit?  No   CCM required this visit?  No      Plan:     I have personally reviewed and noted the following in the patient's chart:   Medical and social history Use of alcohol, tobacco or illicit drugs  Current medications and supplements including opioid prescriptions.   Functional ability and status Nutritional status Physical activity Advanced directives List of other physicians Hospitalizations, surgeries, and ER visits in previous 12 months Vitals Screenings to include cognitive, depression, and falls Referrals and appointments  In addition, I have reviewed and discussed with patient certain preventive protocols, quality metrics, and best practice recommendations. A written personalized care plan for preventive services as well as general preventive health recommendations were provided to patient.     Erie Noe, LPN   [id number]/02/[id number]

## 2022-11-15 ENCOUNTER — Other Ambulatory Visit: Payer: Self-pay

## 2022-11-15 MED ORDER — ROSUVASTATIN CALCIUM 20 MG PO TABS: 20.0000 mg | ORAL_TABLET | Freq: Every day | ORAL | 0 refills | Status: DC

## 2023-02-10 ENCOUNTER — Other Ambulatory Visit: Payer: Self-pay | Admitting: Family Medicine

## 2023-03-22 ENCOUNTER — Telehealth: Payer: Self-pay

## 2023-03-22 NOTE — Patient Outreach (Signed)
  Care Coordination   03/22/2023 Name: Kyle Torres MRN: [id number] DOB: [birth year]   Care Coordination Outreach Attempts:  An unsuccessful telephone outreach was attempted today to offer the patient information about available care coordination services as a benefit of their health plan.   Follow Up Plan:  Additional outreach attempts will be made to offer the patient care coordination information and services.   Encounter Outcome:  No Answer   Care Coordination Interventions:  No, not indicated    Rowe Pavy, RN, BSN, [facility] [employer] [phone]

## 2023-03-30 ENCOUNTER — Telehealth: Payer: Self-pay

## 2023-03-30 NOTE — Patient Outreach (Signed)
  Care Coordination   Initial Visit Note   03/30/2023 Name: Kyle Torres MRN: [id number] DOB: [birth year]  Kyle Torres is a 89 y.o. year old male who sees Cox, Kirsten, MD for primary care. I spoke with  Kyle Torres by phone today.  What matters to the patients health and wellness today?  Placed call to patient today to review and offer [facility] care coordination program..  Patient reports that he gets most of his care at the [place].  Denies any medication or transportation concerns.  Denies any needs today.       SDOH assessments and interventions completed:  No     Care Coordination Interventions:  No, not indicated   Follow up plan: No further intervention required.   Encounter Outcome:  Pt. Refused   Kyle Pavy, RN, BSN, CEN [facility] [employer] [phone]

## 2023-04-30 NOTE — Progress Notes (Signed)
Subjective:  Patient ID: Kyle Torres, male    DOB: [birth year]  Age: 89 y.o. MRN: [id number]  Chief Complaint  Patient presents with   Medical Management of Chronic Issues    HPI   Hyperlipidemia: Currently on Crestor 20 mg daily.   Hypertension: Currently on amlodipine 5 mg daily. Blood pressure ranges from 130-145/60-80.  Denies chest pain or shortness of breath.  History of a stroke.  Patient has mild ataxia still.  He currently takes aspirin and his statin medicines.  GERD: Currently on omeprazole 20 mg twice daily.  Has not been able to cut it down.   Anemia: Mild.  We will recheck today.  Was normocytic in the past.  Asthma: Currently on Asmanex  one inhalation daily.  Takes albuterol as needed.  Also is on Flonase nasal spray.  BPH: On Proscar and terazosin. No difficulty urinating. Has to get up once at night.   OSA: not using cpap. Just never restarted after moving homes.      05/01/2023    8:12 AM 11/11/2022   11:03 AM 11/02/2021    8:11 PM 05/04/2021   10:25 AM 02/03/2021   10:25 AM  Depression screen PHQ 2/9  Decreased Interest 0 0 0 0 0  Down, Depressed, Hopeless 0 0 0 0 0  PHQ - 2 Score 0 0 0 0 0        05/01/2023    8:11 AM  Fall Risk   Falls in the past year? 0  Number falls in past yr: 0  Injury with Fall? 0  Risk for fall due to : History of fall(s)  Follow up Falls evaluation completed;Falls prevention discussed    Patient Care Team: Dierks Wach, Fritzi Mandes, MD as PCP - General (Family Medicine)   Review of Systems  Constitutional:  Negative for chills and fever.  HENT:  Negative for congestion, rhinorrhea and sore throat.   Respiratory:  Negative for cough and shortness of breath.   Cardiovascular:  Negative for chest pain and palpitations.  Gastrointestinal:  Negative for abdominal pain, constipation (takes stool softener prn), diarrhea, nausea and vomiting.  Genitourinary:  Negative for dysuria and urgency.  Musculoskeletal:  Positive for  arthralgias (right knee pain). Negative for back pain and myalgias.  Neurological:  Negative for dizziness and headaches.  Psychiatric/Behavioral:  Negative for dysphoric mood. The patient is not nervous/anxious.     Current Outpatient Medications on File Prior to Visit  Medication Sig Dispense Refill   albuterol (PROVENTIL HFA;VENTOLIN HFA) 108 (90 BASE) MCG/ACT inhaler Inhale 1 puff into the lungs every 6 (six) hours as needed. FOR SHORTNESS OF BREATH     amLODipine (NORVASC) 5 MG tablet TAKE 1 TABLET(5 MG) BY MOUTH DAILY 90 tablet 1   aspirin EC 81 MG tablet Take 1 tablet (81 mg total) by mouth daily. 90 tablet 3   cholecalciferol (VITAMIN D3) 25 MCG (1000 UNIT) tablet Take 1,000 Units by mouth daily.     finasteride (PROSCAR) 5 MG tablet Take 5 mg by mouth daily.     fluticasone (FLONASE) 50 MCG/ACT nasal spray Place into both nostrils daily.     mometasone (ASMANEX) 220 MCG/INH inhaler Inhale 1 puff into the lungs 2 (two) times daily.     Multiple Vitamin (MULTIVITAMIN) tablet Take 1 tablet by mouth daily.     omeprazole (PRILOSEC) 20 MG capsule Take 20 mg by mouth 2 (two) times daily.     rosuvastatin (CRESTOR) 20 MG tablet TAKE 1 TABLET(20 MG)  BY MOUTH DAILY 90 tablet 1   terazosin (HYTRIN) 5 MG capsule Take 5 mg by mouth 2 (two) times daily.     No current facility-administered medications on file prior to visit.   Past Medical History:  Diagnosis Date   Arthritis    Asthma    BPH (benign prostatic hypertrophy)    Essential hypertension, malignant 04/22/2020   GERD (gastroesophageal reflux disease)    Hiatal hernia neck pain and back pain    Reflux    Sepsis secondary to UTI (HCC) 10/14/2020   SIRS (systemic inflammatory response syndrome) (HCC) 10/14/2020   [facility]    Stroke (HCC) 02/2018   rt hand has difficulty with fine movements.   Past Surgical History:  Procedure Laterality Date   ANTERIOR CERVICAL DECOMP/DISCECTOMY FUSION   10/19/2011   Procedure: ANTERIOR CERVICAL DECOMPRESSION/DISCECTOMY FUSION 2 LEVELS;  Surgeon: Cristi Loron;  [facility];  Service: Neurosurgery;  Laterality: N/A;  CERVICAL FIVE-SIX, SIX-SEVEN ANTERIOR CERVICAL DECOMPRESSION WITH FUSION AND  INTERBODY PROTHESIS PLATING   CHOLECYSTECTOMY  2010   EYE SURGERY     LUMBAR SPINE SURGERY  05/2020   L4-5 LAMINECTOMY AND FUSION W/ PEDICLE SCREWS, TLIF, POSSIBLE ALLOGRAFT & BONE MARROW ASPIRATION. 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  Tobacco Use   Smoking status: Former    Types: Cigarettes    Quit date: 12/13/1975    Years since quitting: 47.4   Smokeless tobacco: Former    Quit date: 10/10/1983  Vaping Use   Vaping Use: Never used  Substance and Sexual Activity   Alcohol use: No   Drug use: No   Sexual activity: Not on file  Other Topics Concern   Not on file  Social History Narrative   Not on file   Social Determinants of Health   Financial Resource Strain: Low Risk  (11/02/2021)   Overall Financial Resource Strain (CARDIA)    Difficulty of Paying Living Expenses: Not hard at all  Food Insecurity: No Food Insecurity (11/11/2022)   Hunger Vital Sign    Worried About Running Out of Food in the Last Year: Never true    Ran Out of Food in the Last Year: Never true  Transportation Needs: No Transportation Needs (11/11/2022)   PRAPARE - Administrator, Civil Service (Medical): No    Lack of Transportation  (Non-Medical): No  Physical Activity: Inactive (11/11/2022)   Exercise Vital Sign    Days of Exercise per Week: 0 days    Minutes of Exercise per Session: 0 min  Stress: No Stress Concern Present (11/11/2022)   [employer] of Occupational Health - Occupational Stress Questionnaire    Feeling of Stress : Not at all  Social Connections: Socially Isolated (11/02/2021)   Social Connection and Isolation Panel [NHANES]    Frequency  of Communication with Friends and Family: More than three times a week    Frequency of Social Gatherings with Friends and Family: More than three times a week    Attends Religious Services: Never    Database administrator or Organizations: No    Attends Banker Meetings: Never    Marital Status: Widowed    Objective:  BP 120/60   Pulse 88   Temp (!) 96.9 F (36.1 C)   Resp 18   Ht 5\' 10"  (1.778 m)   Wt 209 lb (94.8 kg)   BMI 29.99 kg/m      05/01/2023    7:57 AM 11/11/2022   10:49 AM 10/24/2022    9:31 AM  BP/Weight  Systolic BP 120 122 124  Diastolic BP 60 74 70  Wt. (Lbs) 209 213.6 197  BMI 29.99 kg/m2 30.65 kg/m2 28.27 kg/m2    Physical Exam Constitutional:      Appearance: Normal appearance.  HENT:     Right Ear: Tympanic membrane, ear canal and external ear normal.     Left Ear: Tympanic membrane, ear canal and external ear normal.     Nose: Nose normal. No congestion or rhinorrhea.     Mouth/Throat:     Mouth: Mucous membranes are moist.     Pharynx: No oropharyngeal exudate or posterior oropharyngeal erythema.  Cardiovascular:     Rate and Rhythm: Normal rate and regular rhythm.     Heart sounds: Normal heart sounds.  Pulmonary:     Effort: Pulmonary effort is normal. No respiratory distress.     Breath sounds: Normal breath sounds. No wheezing, rhonchi or rales.  Abdominal:     General: Bowel sounds are normal.     Palpations: Abdomen is soft.     Tenderness: There is no abdominal tenderness.  Lymphadenopathy:      Cervical: No cervical adenopathy.  Neurological:     Mental Status: He is alert.  Psychiatric:        Mood and Affect: Mood normal.        Behavior: Behavior normal.     Diabetic Foot Exam - Simple   No data filed      Lab Results  Component Value Date   WBC 7.9 05/01/2023   HGB 12.6 (L) 05/01/2023   HCT 37.6 05/01/2023   PLT 263 05/01/2023   GLUCOSE 88 05/01/2023   CHOL 112 05/01/2023   TRIG 44 05/01/2023   HDL 61 05/01/2023   LDLCALC 40 05/01/2023   ALT 23 05/01/2023   AST 28 05/01/2023   NA 139 05/01/2023   K 4.2 05/01/2023   CL 102 05/01/2023   CREATININE 0.84 05/01/2023   BUN 11 05/01/2023   CO2 25 05/01/2023   TSH 1.630 02/03/2021   INR 1.2 10/13/2020      Assessment & Plan:    Essential hypertension Assessment & Plan: Well controlled.  No changes to medicines. Amlodipine 5 mg daily. Continue to work on eating a healthy diet and exercise.  Labs drawn today.    Orders: -     CBC with Differential/Platelet -     Comprehensive metabolic panel  Mixed hyperlipidemia Assessment & Plan: Well controlled.  No changes to medicines. Continue crestor 20 mg before bed. Continue to work on eating a healthy diet and exercise.  Labs drawn today.    Orders: -     Lipid panel  OSA (obstructive sleep apnea) Assessment & Plan: Patient in not interested in restarting  CPAP.     Mild intermittent asthma without complication Assessment & Plan: Asmanex 1 puff twice daily. Albuterol inhaler as needed.   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 Control hypertension with amlodipine.    Other orders -     Cardiovascular Risk Assessment     No orders of the defined types were placed in this encounter.   Orders Placed This Encounter  Procedures   CBC with Differential/Platelet   Comprehensive metabolic panel    Lipid panel   Cardiovascular Risk Assessment     Follow-up: Return in about 6 months (around 11/01/2023).   Clayborn Bigness I Leal-Borjas,acting as a scribe for Blane Ohara, MD.,have documented all relevant documentation on the behalf of Blane Ohara, MD,as directed by  Blane Ohara, MD while in the presence of Blane Ohara, MD.   I attest that I have reviewed this visit and agree with the plan scribed by my staff.   Blane Ohara, MD [facility] [phone]

## 2023-04-30 NOTE — Assessment & Plan Note (Signed)
Well controlled.  No changes to medicines. Amlodipine 5 mg daily. Continue to work on eating a healthy diet and exercise.  Labs drawn today.  

## 2023-04-30 NOTE — Assessment & Plan Note (Signed)
Well controlled.  No changes to medicines. Continue crestor 20 mg before bed.  Continue to work on eating a healthy diet and exercise.  Labs drawn today.   

## 2023-05-01 ENCOUNTER — Ambulatory Visit (INDEPENDENT_AMBULATORY_CARE_PROVIDER_SITE_OTHER): Payer: PPO | Admitting: Family Medicine

## 2023-05-01 ENCOUNTER — Encounter: Payer: Self-pay | Admitting: Family Medicine

## 2023-05-01 VITALS — BP 120/60 | HR 88 | Temp 96.9°F | Resp 18 | Ht 70.0 in | Wt 209.0 lb

## 2023-05-01 DIAGNOSIS — J4520 Mild intermittent asthma, uncomplicated: Secondary | ICD-10-CM

## 2023-05-01 DIAGNOSIS — R269 Unspecified abnormalities of gait and mobility: Secondary | ICD-10-CM | POA: Diagnosis not present

## 2023-05-01 DIAGNOSIS — I10 Essential (primary) hypertension: Secondary | ICD-10-CM

## 2023-05-01 DIAGNOSIS — E782 Mixed hyperlipidemia: Secondary | ICD-10-CM

## 2023-05-01 DIAGNOSIS — Z8673 Personal history of transient ischemic attack (TIA), and cerebral infarction without residual deficits: Secondary | ICD-10-CM | POA: Diagnosis not present

## 2023-05-01 DIAGNOSIS — I69398 Other sequelae of cerebral infarction: Secondary | ICD-10-CM

## 2023-05-01 DIAGNOSIS — G4733 Obstructive sleep apnea (adult) (pediatric): Secondary | ICD-10-CM | POA: Diagnosis not present

## 2023-05-01 NOTE — Assessment & Plan Note (Signed)
Patient in not interested in restarting CPAP.

## 2023-05-01 NOTE — Assessment & Plan Note (Signed)
Asmanex 1 puff twice daily. Albuterol inhaler as needed.

## 2023-05-02 LAB — COMPREHENSIVE METABOLIC PANEL
ALT: 23 IU/L (ref 0–44)
AST: 28 IU/L (ref 0–40)
Albumin/Globulin Ratio: 1.4 (ref 1.2–2.2)
Albumin: 4 g/dL (ref 3.7–4.7)
Alkaline Phosphatase: 66 IU/L (ref 44–121)
BUN/Creatinine Ratio: 13 (ref 10–24)
BUN: 11 mg/dL (ref 8–27)
Bilirubin Total: 0.4 mg/dL (ref 0.0–1.2)
CO2: 25 mmol/L (ref 20–29)
Calcium: 9.2 mg/dL (ref 8.6–10.2)
Chloride: 102 mmol/L (ref 96–106)
Creatinine, Ser: 0.84 mg/dL (ref 0.76–1.27)
Globulin, Total: 2.9 g/dL (ref 1.5–4.5)
Glucose: 88 mg/dL (ref 70–99)
Potassium: 4.2 mmol/L (ref 3.5–5.2)
Sodium: 139 mmol/L (ref 134–144)
Total Protein: 6.9 g/dL (ref 6.0–8.5)
eGFR: 85 mL/min/{1.73_m2} (ref >59)

## 2023-05-02 LAB — LIPID PANEL
Chol/HDL Ratio: 1.8 ratio (ref 0.0–5.0)
Cholesterol, Total: 112 mg/dL (ref 100–199)
HDL: 61 mg/dL (ref >39)
LDL Chol Calc (NIH): 40 mg/dL (ref 0–99)
Triglycerides: 44 mg/dL (ref 0–149)
VLDL Cholesterol Cal: 11 mg/dL (ref 5–40)

## 2023-05-02 LAB — CBC WITH DIFFERENTIAL/PLATELET
Basophils Absolute: 0 10*3/uL (ref 0.0–0.2)
Basos: 0 %
EOS (ABSOLUTE): 0.3 10*3/uL (ref 0.0–0.4)
Eos: 3 %
Hematocrit: 37.6 % (ref 37.5–51.0)
Hemoglobin: 12.6 g/dL — ABNORMAL LOW (ref 13.0–17.7)
Immature Grans (Abs): 0 10*3/uL (ref 0.0–0.1)
Immature Granulocytes: 0 %
Lymphocytes Absolute: 2.6 10*3/uL (ref 0.7–3.1)
Lymphs: 33 %
MCH: 31.9 pg (ref 26.6–33.0)
MCHC: 33.5 g/dL (ref 31.5–35.7)
MCV: 95 fL (ref 79–97)
Monocytes Absolute: 0.7 10*3/uL (ref 0.1–0.9)
Monocytes: 8 %
Neutrophils Absolute: 4.3 10*3/uL (ref 1.4–7.0)
Neutrophils: 56 %
Platelets: 263 10*3/uL (ref 150–450)
RBC: 3.95 x10E6/uL — ABNORMAL LOW (ref 4.14–5.80)
RDW: 12.9 % (ref 11.6–15.4)
WBC: 7.9 10*3/uL (ref 3.4–10.8)

## 2023-05-02 LAB — CARDIOVASCULAR RISK ASSESSMENT

## 2023-05-02 NOTE — Assessment & Plan Note (Signed)
Stable

## 2023-05-02 NOTE — Assessment & Plan Note (Signed)
Continue aspirin, crestor. Control hypertension with amlodipine.  

## 2023-05-07 ENCOUNTER — Encounter: Payer: Self-pay | Admitting: Family Medicine

## 2023-05-15 DIAGNOSIS — R2689 Other abnormalities of gait and mobility: Secondary | ICD-10-CM | POA: Diagnosis not present

## 2023-05-15 DIAGNOSIS — M6281 Muscle weakness (generalized): Secondary | ICD-10-CM | POA: Diagnosis not present

## 2023-05-22 DIAGNOSIS — M6281 Muscle weakness (generalized): Secondary | ICD-10-CM | POA: Diagnosis not present

## 2023-05-22 DIAGNOSIS — R2689 Other abnormalities of gait and mobility: Secondary | ICD-10-CM | POA: Diagnosis not present

## 2023-05-24 DIAGNOSIS — R2689 Other abnormalities of gait and mobility: Secondary | ICD-10-CM | POA: Diagnosis not present

## 2023-05-24 DIAGNOSIS — M6281 Muscle weakness (generalized): Secondary | ICD-10-CM | POA: Diagnosis not present

## 2023-05-30 DIAGNOSIS — R2689 Other abnormalities of gait and mobility: Secondary | ICD-10-CM | POA: Diagnosis not present

## 2023-05-30 DIAGNOSIS — M6281 Muscle weakness (generalized): Secondary | ICD-10-CM | POA: Diagnosis not present

## 2023-06-02 DIAGNOSIS — M6281 Muscle weakness (generalized): Secondary | ICD-10-CM | POA: Diagnosis not present

## 2023-06-02 DIAGNOSIS — R2689 Other abnormalities of gait and mobility: Secondary | ICD-10-CM | POA: Diagnosis not present

## 2023-06-06 DIAGNOSIS — M6281 Muscle weakness (generalized): Secondary | ICD-10-CM | POA: Diagnosis not present

## 2023-06-06 DIAGNOSIS — R2689 Other abnormalities of gait and mobility: Secondary | ICD-10-CM | POA: Diagnosis not present

## 2023-06-08 DIAGNOSIS — R2689 Other abnormalities of gait and mobility: Secondary | ICD-10-CM | POA: Diagnosis not present

## 2023-06-08 DIAGNOSIS — M6281 Muscle weakness (generalized): Secondary | ICD-10-CM | POA: Diagnosis not present

## 2023-06-14 DIAGNOSIS — M6281 Muscle weakness (generalized): Secondary | ICD-10-CM | POA: Diagnosis not present

## 2023-06-14 DIAGNOSIS — R2689 Other abnormalities of gait and mobility: Secondary | ICD-10-CM | POA: Diagnosis not present

## 2023-08-08 ENCOUNTER — Ambulatory Visit (INDEPENDENT_AMBULATORY_CARE_PROVIDER_SITE_OTHER): Payer: PPO

## 2023-08-08 VITALS — BP 128/62 | HR 68 | Temp 97.5°F | Ht 70.0 in | Wt 203.0 lb

## 2023-08-08 DIAGNOSIS — L570 Actinic keratosis: Secondary | ICD-10-CM | POA: Insufficient documentation

## 2023-08-08 NOTE — Assessment & Plan Note (Signed)
The 6-7 mm lesion on scalp appears to be Actinic keratosis.  After obtaining informed consent, the lesion/actinic keratosis is treated with 3 cycles of freeze-thaw cryotherapy using a cryogun. Patient tolerated the procedure well.  Postprocedure instructions given  Advised to return in 2 or 3 months if he needs another session of cryotherapy or biopsy at that point if needed.

## 2023-08-08 NOTE — Progress Notes (Signed)
Acute Office Visit  Subjective:    Patient ID: Kyle Torres, male    DOB: Jun 15, 1934, 89 y.o.   MRN: [id number]  Chief Complaint  Patient presents with   Spot on head    HPI Patient is in today for a spot on top of his head. He states that it has been there for over 2 weeks and it doesn't itch or anything. Patient states that it stays really dry and rough feeling.  Past Medical History:  Diagnosis Date   Arthritis    Asthma    BPH (benign prostatic hypertrophy)    Essential hypertension, malignant 04/22/2020   GERD (gastroesophageal reflux disease)    Hiatal hernia neck pain and back pain    Reflux    Sepsis secondary to UTI (HCC) 10/14/2020   SIRS (systemic inflammatory response syndrome) (HCC) 10/14/2020   Sleep apnea study was [facility]yrs ago at [facility]    Stroke (HCC) 02/2018   rt hand has difficulty with fine movements.    Past Surgical History:  Procedure Laterality Date   ANTERIOR CERVICAL DECOMP/DISCECTOMY FUSION  10/19/2011   Procedure: ANTERIOR CERVICAL DECOMPRESSION/DISCECTOMY FUSION 2 LEVELS;  Surgeon: Cristi Loron;  Location: [facility];  Service: Neurosurgery;  Laterality: N/A;  CERVICAL FIVE-SIX, SIX-SEVEN ANTERIOR CERVICAL DECOMPRESSION WITH FUSION AND  INTERBODY PROTHESIS PLATING   CHOLECYSTECTOMY  2010   EYE SURGERY     LUMBAR SPINE SURGERY  05/2020   L4-5 LAMINECTOMY AND FUSION W/ PEDICLE SCREWS, TLIF, POSSIBLE ALLOGRAFT & BONE MARROW ASPIRATION. 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  Tobacco Use   Smoking status: Former    Current packs/day: 0.00    Types: Cigarettes    Quit date: 12/13/1975    Years since quitting: 47.6   Smokeless tobacco: Former    Quit date: 10/10/1983  Vaping Use   Vaping status: Never Used  Substance and Sexual Activity   Alcohol use: No   Drug use: No   Sexual activity: Not on file  Other Topics Concern   Not on file  Social History Narrative   Not on file   Social Determinants of Health   Financial Resource Strain: Low Risk  (11/02/2021)   Overall Financial Resource Strain (CARDIA)    Difficulty of Paying Living Expenses: Not hard at all  Food Insecurity: No Food Insecurity (11/11/2022)   Hunger Vital Sign    Worried About Running Out of Food in the Last Year: Never true    Ran Out of Food in the Last Year: Never true  Transportation Needs: No Transportation Needs (11/11/2022)   PRAPARE - Administrator, Civil Service (Medical): No    Lack of Transportation (Non-Medical): No  Physical Activity: Inactive (11/11/2022)   Exercise Vital Sign    Days of Exercise per Week: 0 days    Minutes  of Exercise per Session: 0 min  Stress: No Stress Concern Present (11/11/2022)   [employer] of Occupational Health - Occupational Stress Questionnaire    Feeling of Stress : Not at all  Social Connections: Socially Isolated (11/02/2021)   Social Connection and Isolation Panel [NHANES]    Frequency of Communication with Friends and Family: More than three times a week    Frequency of Social Gatherings with Friends and Family: More than three times a week    Attends Religious Services: Never    Database administrator or Organizations: No    Attends Banker Meetings: Never    Marital Status: Widowed  Intimate Partner Violence: Not At Risk (11/11/2022)   Humiliation, Afraid, Rape, and Kick questionnaire    Fear of Current or Ex-Partner: No    Emotionally Abused: No     Physically Abused: No    Sexually Abused: No    Outpatient Medications Prior to Visit  Medication Sig Dispense Refill   albuterol (PROVENTIL HFA;VENTOLIN HFA) 108 (90 BASE) MCG/ACT inhaler Inhale 1 puff into the lungs every 6 (six) hours as needed. FOR SHORTNESS OF BREATH     amLODipine (NORVASC) 5 MG tablet TAKE 1 TABLET(5 MG) BY MOUTH DAILY 90 tablet 1   aspirin EC 81 MG tablet Take 1 tablet (81 mg total) by mouth daily. 90 tablet 3   cholecalciferol (VITAMIN D3) 25 MCG (1000 UNIT) tablet Take 1,000 Units by mouth daily.     finasteride (PROSCAR) 5 MG tablet Take 5 mg by mouth daily.     fluticasone (FLONASE) 50 MCG/ACT nasal spray Place into both nostrils daily.     mometasone (ASMANEX) 220 MCG/INH inhaler Inhale 1 puff into the lungs 2 (two) times daily.     Multiple Vitamin (MULTIVITAMIN) tablet Take 1 tablet by mouth daily.     omeprazole (PRILOSEC) 20 MG capsule Take 20 mg by mouth 2 (two) times daily.     rosuvastatin (CRESTOR) 20 MG tablet TAKE 1 TABLET(20 MG) BY MOUTH DAILY 90 tablet 1   terazosin (HYTRIN) 5 MG capsule Take 5 mg by mouth 2 (two) times daily.     No facility-administered medications prior to visit.    No Known Allergies  Review of Systems  Constitutional:  Negative for appetite change, fatigue and fever.  HENT:  Negative for congestion, ear pain, sinus pressure and sore throat.   Respiratory:  Negative for cough, chest tightness, shortness of breath and wheezing.   Cardiovascular:  Negative for chest pain and palpitations.  Gastrointestinal:  Negative for abdominal pain, constipation, diarrhea, nausea and vomiting.  Genitourinary:  Negative for dysuria and hematuria.  Musculoskeletal:  Negative for arthralgias, back pain, joint swelling and myalgias.  Skin:  Negative for rash.       Place on top of head, states its been there for over 2 weeks.  Neurological:  Negative for dizziness, weakness and headaches.  Psychiatric/Behavioral:  Negative for dysphoric  mood. The patient is not nervous/anxious.        Objective:    Physical Exam HENT:     Head: Normocephalic.     Comments: 6-7 mm AK noted on scalp Cardiovascular:     Rate and Rhythm: Normal rate and regular rhythm.  Pulmonary:     Effort: Pulmonary effort is normal.     Breath sounds: Normal breath sounds.  Musculoskeletal:     Comments: Back brace noted, walking with cane  Neurological:     Mental Status: He is  alert.     BP 128/62 (BP Location: Left Arm, Patient Position: Sitting)   Pulse 68   Temp (!) 97.5 F (36.4 C) (Temporal)   Ht 5\' 10"  (1.778 m)   Wt 203 lb (92.1 kg)   SpO2 95%   BMI 29.13 kg/m  Wt Readings from Last 3 Encounters:  08/08/23 203 lb (92.1 kg)  05/01/23 209 lb (94.8 kg)  11/11/22 213 lb 9.6 oz (96.9 kg)    Health Maintenance Due  Topic Date Due   Zoster Vaccines- Shingrix (1 of 2) Never done   COVID-19 Vaccine (6 - 2023-24 season) 12/19/2022   INFLUENZA VACCINE  07/13/2023    There are no preventive care reminders to display for this patient.   Lab Results  Component Value Date   TSH 1.630 02/03/2021   Lab Results  Component Value Date   WBC 7.9 05/01/2023   HGB 12.6 (L) 05/01/2023   HCT 37.6 05/01/2023   MCV 95 05/01/2023   PLT 263 05/01/2023   Lab Results  Component Value Date   NA 139 05/01/2023   K 4.2 05/01/2023   CO2 25 05/01/2023   GLUCOSE 88 05/01/2023   BUN 11 05/01/2023   CREATININE 0.84 05/01/2023   BILITOT 0.4 05/01/2023   ALKPHOS 66 05/01/2023   AST 28 05/01/2023   ALT 23 05/01/2023   PROT 6.9 05/01/2023   ALBUMIN 4.0 05/01/2023   CALCIUM 9.2 05/01/2023   ANIONGAP 9 10/16/2020   EGFR 85 05/01/2023   Lab Results  Component Value Date   CHOL 112 05/01/2023   Lab Results  Component Value Date   HDL 61 05/01/2023   Lab Results  Component Value Date   LDLCALC 40 05/01/2023   Lab Results  Component Value Date   TRIG 44 05/01/2023   Lab Results  Component Value Date   CHOLHDL 1.8 05/01/2023    No results found for: "HGBA1C"       Assessment & Plan:   Actinic keratosis of scalp Assessment & Plan: The 6-7 mm lesion on scalp appears to be Actinic keratosis.  After obtaining informed consent, the lesion/actinic keratosis is treated with 3 cycles of freeze-thaw cryotherapy using a cryogun. Patient tolerated the procedure well.  Postprocedure instructions given  Advised to return in 2 or 3 months if he needs another session of cryotherapy or biopsy at that point if needed.         I,Lauren M Auman,acting as a Neurosurgeon for [employer], MD.,have documented all relevant documentation on the behalf of Windell Moment, MD,as directed by  Windell Moment, MD while in the presence of Windell Moment, MD.   Windell Moment, MD

## 2023-08-08 NOTE — Patient Instructions (Signed)
Cryotherapy done on the spot on your scalp It may look a little inflamed Come back If it does not go away completely

## 2023-08-09 ENCOUNTER — Other Ambulatory Visit: Payer: Self-pay | Admitting: Family Medicine

## 2023-10-17 ENCOUNTER — Other Ambulatory Visit: Payer: Self-pay | Admitting: Family Medicine

## 2023-10-22 NOTE — Assessment & Plan Note (Signed)
Well controlled.  No changes to medicines. Continue crestor 20 mg before bed.  Continue to work on eating a healthy diet and exercise.  Labs drawn today.   

## 2023-10-22 NOTE — Progress Notes (Unsigned)
Subjective:  Patient ID: Kyle Torres, male    DOB: 07/14/1934  Age: 89 y.o. MRN: [id number]  No chief complaint on file.   HPI   Hyperlipidemia: Currently on Crestor 20 mg daily.   Hypertension: Currently on amlodipine 5 mg daily. Blood pressure ranges from 130-145/60-80.  Denies chest pain or shortness of breath.  History of a stroke.  Patient has mild ataxia still.  He currently takes aspirin and his statin medicines.  GERD: Currently on omeprazole 20 mg twice daily.  Has not been able to cut it down.   Anemia: Mild.  We will recheck today.  Was normocytic in the past.  Asthma: Currently on Asmanex  one inhalation daily.  Takes albuterol as needed.  Also is on Flonase nasal spray.  BPH: On Proscar and terazosin. No difficulty urinating. Has to get up once at night.   OSA: not using cpap. Just never restarted after moving homes.      05/01/2023    8:12 AM 11/11/2022   11:03 AM 11/02/2021    8:11 PM 05/04/2021   10:25 AM 02/03/2021   10:25 AM  Depression screen PHQ 2/9  Decreased Interest 0 0 0 0 0  Down, Depressed, Hopeless 0 0 0 0 0  PHQ - 2 Score 0 0 0 0 0        05/01/2023    8:11 AM  Fall Risk   Falls in the past year? 0  Number falls in past yr: 0  Injury with Fall? 0  Risk for fall due to : History of fall(s)  Follow up Falls evaluation completed;Falls prevention discussed    Patient Care Team: Anelis Hrivnak, Fritzi Mandes, MD as PCP - General (Family Medicine)   Review of Systems  Current Outpatient Medications on File Prior to Visit  Medication Sig Dispense Refill   albuterol (PROVENTIL HFA;VENTOLIN HFA) 108 (90 BASE) MCG/ACT inhaler Inhale 1 puff into the lungs every 6 (six) hours as needed. FOR SHORTNESS OF BREATH     amLODipine (NORVASC) 5 MG tablet TAKE 1 TABLET(5 MG) BY MOUTH DAILY 90 tablet 0   aspirin EC 81 MG tablet Take 1 tablet (81 mg total) by mouth daily. 90 tablet 3   cholecalciferol (VITAMIN D3) 25 MCG (1000 UNIT) tablet Take 1,000 Units by mouth  daily.     finasteride (PROSCAR) 5 MG tablet Take 5 mg by mouth daily.     fluticasone (FLONASE) 50 MCG/ACT nasal spray Place into both nostrils daily.     mometasone (ASMANEX) 220 MCG/INH inhaler Inhale 1 puff into the lungs 2 (two) times daily.     Multiple Vitamin (MULTIVITAMIN) tablet Take 1 tablet by mouth daily.     omeprazole (PRILOSEC) 20 MG capsule Take 20 mg by mouth 2 (two) times daily.     rosuvastatin (CRESTOR) 20 MG tablet TAKE 1 TABLET(20 MG) BY MOUTH DAILY 90 tablet 0   terazosin (HYTRIN) 5 MG capsule Take 5 mg by mouth 2 (two) times daily.     No current facility-administered medications on file prior to visit.   Past Medical History:  Diagnosis Date   Arthritis    Asthma    BPH (benign prostatic hypertrophy)    Essential hypertension, malignant 04/22/2020   GERD (gastroesophageal reflux disease)    Hiatal hernia neck pain and back pain    Reflux    Sepsis secondary to UTI (HCC) 10/14/2020   SIRS (systemic inflammatory response syndrome) (HCC) 10/14/2020   Sleep apnea study was 70yrs ago  at [facility]    Stroke [facility]) 02/2018   rt hand has difficulty with fine movements.   Past Surgical History:  Procedure Laterality Date   ANTERIOR CERVICAL DECOMP/DISCECTOMY FUSION  10/19/2011   Procedure: ANTERIOR CERVICAL DECOMPRESSION/DISCECTOMY FUSION 2 LEVELS;  Surgeon: Cristi Loron;  Location: [facility];  Service: Neurosurgery;  Laterality: N/A;  CERVICAL FIVE-SIX, SIX-SEVEN ANTERIOR CERVICAL DECOMPRESSION WITH FUSION AND  INTERBODY PROTHESIS PLATING   CHOLECYSTECTOMY  2010   EYE SURGERY     LUMBAR SPINE SURGERY  05/2020   L4-5 LAMINECTOMY AND FUSION W/ PEDICLE SCREWS, TLIF, POSSIBLE ALLOGRAFT & BONE MARROW ASPIRATION. 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  Tobacco Use   Smoking status: Former    Current packs/day: 0.00    Types: Cigarettes    Quit date: 12/13/1975    Years since quitting: 47.8   Smokeless tobacco: Former    Quit date: 10/10/1983  Vaping Use   Vaping status: Never Used  Substance and Sexual Activity   Alcohol use: No   Drug use: No   Sexual activity: Not on file  Other Topics Concern   Not on file  Social History Narrative   Not on file   Social Determinants of Health   Financial Resource Strain: Low Risk  (11/02/2021)   Overall Financial Resource Strain (CARDIA)    Difficulty of Paying Living Expenses: Not hard at all  Food Insecurity: No Food Insecurity (11/11/2022)   Hunger Vital Sign    Worried About Running Out of Food in the Last Year: Never true    Ran Out of Food in the Last Year: Never true  Transportation Needs: No Transportation Needs (11/11/2022)   PRAPARE - Administrator, Civil Service (Medical): No    Lack of Transportation (Non-Medical): No  Physical Activity: Inactive (11/11/2022)   Exercise Vital Sign    Days of Exercise per Week: 0 days    Minutes of Exercise per Session: 0 min  Stress: No Stress Concern Present (11/11/2022)   [employer] of Occupational Health - Occupational Stress Questionnaire    Feeling of Stress : Not at all  Social Connections: Socially Isolated (11/02/2021)   Social Connection and Isolation Panel [NHANES]    Frequency of Communication with Friends and Family: More than three times a week    Frequency of Social Gatherings with Friends and Family: More than three times a week    Attends Religious Services: Never    Database administrator or  Organizations: No    Attends Banker Meetings: Never    Marital Status: Widowed    Objective:  There were no vitals taken for this visit.     08/08/2023    9:19 AM 05/01/2023    7:57 AM 11/11/2022   10:49 AM  BP/Weight  Systolic BP 128 120 122  Diastolic BP 62 60 74  Wt. (Lbs) 203 209 213.6  BMI 29.13 kg/m2 29.99 kg/m2 30.65 kg/m2    Physical Exam  Diabetic Foot Exam - Simple   No data filed      Lab Results  Component Value Date   WBC 7.9 05/01/2023   HGB 12.6 (L) 05/01/2023   HCT 37.6 05/01/2023   PLT 263 05/01/2023   GLUCOSE 88 05/01/2023   CHOL 112 05/01/2023   TRIG 44 05/01/2023   HDL 61 05/01/2023   LDLCALC 40 05/01/2023   ALT 23 05/01/2023   AST 28 05/01/2023   NA 139 05/01/2023   K 4.2 05/01/2023   CL 102 05/01/2023   CREATININE 0.84 05/01/2023   BUN 11 05/01/2023   CO2 25 05/01/2023   TSH 1.630 02/03/2021   INR 1.2 10/13/2020      Assessment & Plan:    Essential hypertension Assessment & Plan: Well controlled.  No changes to medicines. Amlodipine 5 mg daily. Continue to work on eating a healthy diet and exercise.  Labs drawn today.     Mixed hyperlipidemia Assessment & Plan: Well controlled.  No changes to medicines. Continue crestor 20 mg before bed. Continue to work on eating a healthy diet and exercise.  Labs drawn today.        No orders of the defined types were placed in this encounter.   No orders of the defined types were placed in this encounter.    Follow-up: No follow-ups on file.   I,Marla I Leal-Borjas,acting as a scribe for Blane Ohara, MD.,have documented all relevant documentation on the behalf of Blane Ohara, MD,as directed by  Blane Ohara, MD while in the presence of Blane Ohara, MD.   An After Visit Summary was printed and given to the patient.  Blane Ohara, MD [facility] [phone]

## 2023-10-22 NOTE — Assessment & Plan Note (Signed)
Well controlled.  No changes to medicines. Amlodipine 5 mg daily. Continue to work on eating a healthy diet and exercise.  Labs drawn today.  

## 2023-10-23 ENCOUNTER — Telehealth: Payer: Self-pay

## 2023-10-23 ENCOUNTER — Ambulatory Visit (INDEPENDENT_AMBULATORY_CARE_PROVIDER_SITE_OTHER): Payer: PPO | Admitting: Family Medicine

## 2023-10-23 ENCOUNTER — Other Ambulatory Visit: Payer: Self-pay

## 2023-10-23 ENCOUNTER — Encounter: Payer: Self-pay | Admitting: Family Medicine

## 2023-10-23 VITALS — BP 136/78 | HR 81 | Temp 97.2°F | Ht 70.0 in | Wt 196.0 lb

## 2023-10-23 DIAGNOSIS — I700 Atherosclerosis of aorta: Secondary | ICD-10-CM | POA: Diagnosis not present

## 2023-10-23 DIAGNOSIS — E782 Mixed hyperlipidemia: Secondary | ICD-10-CM

## 2023-10-23 DIAGNOSIS — R270 Ataxia, unspecified: Secondary | ICD-10-CM | POA: Diagnosis not present

## 2023-10-23 DIAGNOSIS — I499 Cardiac arrhythmia, unspecified: Secondary | ICD-10-CM

## 2023-10-23 DIAGNOSIS — I10 Essential (primary) hypertension: Secondary | ICD-10-CM

## 2023-10-23 DIAGNOSIS — R2689 Other abnormalities of gait and mobility: Secondary | ICD-10-CM | POA: Diagnosis not present

## 2023-10-23 MED ORDER — CLOPIDOGREL BISULFATE 75 MG PO TABS: 75.0000 mg | ORAL_TABLET | Freq: Every day | ORAL | 0 refills | Status: DC

## 2023-10-23 NOTE — Telephone Encounter (Signed)
Patient informed of CT head results per Dr. Sedalia Muta results showed no new stroke, no bleed in brain, recommends MRI and patient to start plavix 75 mg daily. Patient informed of results. Rx sent to pharmacy. MRI to be ordered.

## 2023-10-24 DIAGNOSIS — I499 Cardiac arrhythmia, unspecified: Secondary | ICD-10-CM | POA: Insufficient documentation

## 2023-10-24 DIAGNOSIS — R270 Ataxia, unspecified: Secondary | ICD-10-CM | POA: Insufficient documentation

## 2023-10-24 LAB — COMPREHENSIVE METABOLIC PANEL
ALT: 19 [IU]/L (ref 0–44)
AST: 26 [IU]/L (ref 0–40)
Albumin: 4 g/dL (ref 3.7–4.7)
Alkaline Phosphatase: 63 [IU]/L (ref 44–121)
BUN/Creatinine Ratio: 13 (ref 10–24)
BUN: 12 mg/dL (ref 8–27)
Bilirubin Total: 0.4 mg/dL (ref 0.0–1.2)
CO2: 23 mmol/L (ref 20–29)
Calcium: 9 mg/dL (ref 8.6–10.2)
Chloride: 101 mmol/L (ref 96–106)
Creatinine, Ser: 0.93 mg/dL (ref 0.76–1.27)
Globulin, Total: 2.9 g/dL (ref 1.5–4.5)
Glucose: 82 mg/dL (ref 70–99)
Potassium: 4.7 mmol/L (ref 3.5–5.2)
Sodium: 139 mmol/L (ref 134–144)
Total Protein: 6.9 g/dL (ref 6.0–8.5)
eGFR: 79 mL/min/{1.73_m2} (ref >59)

## 2023-10-24 LAB — CBC WITH DIFFERENTIAL/PLATELET
Basophils Absolute: 0 10*3/uL (ref 0.0–0.2)
Basos: 1 %
EOS (ABSOLUTE): 0.2 10*3/uL (ref 0.0–0.4)
Eos: 3 %
Hematocrit: 39.5 % (ref 37.5–51.0)
Hemoglobin: 12.9 g/dL — ABNORMAL LOW (ref 13.0–17.7)
Immature Grans (Abs): 0 10*3/uL (ref 0.0–0.1)
Immature Granulocytes: 0 %
Lymphocytes Absolute: 2.3 10*3/uL (ref 0.7–3.1)
Lymphs: 31 %
MCH: 32.6 pg (ref 26.6–33.0)
MCHC: 32.7 g/dL (ref 31.5–35.7)
MCV: 100 fL — ABNORMAL HIGH (ref 79–97)
Monocytes Absolute: 0.7 10*3/uL (ref 0.1–0.9)
Monocytes: 10 %
Neutrophils Absolute: 4.1 10*3/uL (ref 1.4–7.0)
Neutrophils: 55 %
Platelets: 261 10*3/uL (ref 150–450)
RBC: 3.96 x10E6/uL — ABNORMAL LOW (ref 4.14–5.80)
RDW: 12.6 % (ref 11.6–15.4)
WBC: 7.3 10*3/uL (ref 3.4–10.8)

## 2023-10-24 LAB — LIPID PANEL
Chol/HDL Ratio: 1.8 ratio (ref 0.0–5.0)
Cholesterol, Total: 117 mg/dL (ref 100–199)
HDL: 65 mg/dL (ref >39)
LDL Chol Calc (NIH): 41 mg/dL (ref 0–99)
Triglycerides: 42 mg/dL (ref 0–149)
VLDL Cholesterol Cal: 11 mg/dL (ref 5–40)

## 2023-10-24 LAB — TSH: TSH: 1.82 u[IU]/mL (ref 0.450–4.500)

## 2023-10-24 NOTE — Assessment & Plan Note (Signed)
Order ct scan of brain: results normal. Old infarctions. No new strokes. Order brain mri. Start plavix 75 mg daily and add to aspirin 81 mg daily.

## 2023-10-24 NOTE — Assessment & Plan Note (Signed)
Continue aspirin 81 mg daily and crestor 20 mg before bed

## 2023-10-31 DIAGNOSIS — R270 Ataxia, unspecified: Secondary | ICD-10-CM | POA: Diagnosis not present

## 2023-11-01 ENCOUNTER — Encounter: Payer: Self-pay | Admitting: Family Medicine

## 2023-11-07 ENCOUNTER — Other Ambulatory Visit: Payer: Self-pay | Admitting: Family Medicine

## 2023-11-15 ENCOUNTER — Ambulatory Visit: Payer: PPO

## 2023-11-15 VITALS — BP 112/68 | HR 98 | Temp 97.3°F | Ht 70.0 in | Wt 202.0 lb

## 2023-11-15 DIAGNOSIS — Z0000 Encounter for general adult medical examination without abnormal findings: Secondary | ICD-10-CM

## 2023-11-15 NOTE — Progress Notes (Signed)
Subjective:   Kyle Torres is a 89 y.o. male who presents for Medicare Annual/Subsequent preventive examination.  Visit Complete: In person  Patient Medicare AWV questionnaire was completed by the patient on 11/15/2023; I have confirmed that all information answered by patient is correct and no changes since this date.  Cardiac Risk Factors include: advanced age (>13men, >64 women)     Objective:    Today's Vitals   11/15/23 0852 11/15/23 0905  Pulse: 98   Temp: (!) 97.3 F (36.3 C)   TempSrc: Temporal   SpO2: 96%   Weight: 202 lb (91.6 kg)   Height: 5\' 10"  (1.778 m)   PainSc: 0-No pain 0-No pain   Body mass index is 28.98 kg/m.     11/15/2023    8:57 AM 11/02/2021    9:46 AM 10/14/2020    8:05 AM 10/13/2020   10:02 PM 10/10/2011    1:06 PM  Advanced Directives  Does Patient Have a Medical Advance Directive? Yes Yes Yes No Patient has advance directive, copy not in chart  Type of Advance Directive Healthcare Power of [place];Living will Healthcare Power of [employer] of [place];Living will  Healthcare Power of [place];Living will  Does patient want to make changes to medical advance directive? No - Patient declined  No - Patient declined    Copy of Healthcare Power of Attorney in Chart?  No - copy requested No - copy requested      Current Medications (verified) Outpatient Encounter Medications as of 11/15/2023  Medication Sig   albuterol (PROVENTIL HFA;VENTOLIN HFA) 108 (90 BASE) MCG/ACT inhaler Inhale 1 puff into the lungs every 6 (six) hours as needed. FOR SHORTNESS OF BREATH   amLODipine (NORVASC) 5 MG tablet TAKE 1 TABLET(5 MG) BY MOUTH DAILY   aspirin EC 81 MG tablet Take 1 tablet (81 mg total) by mouth daily.   cholecalciferol (VITAMIN D3) 25 MCG (1000 UNIT) tablet Take 1,000 Units by mouth daily.   clopidogrel (PLAVIX) 75 MG tablet Take 1 tablet (75 mg total) by mouth daily.   finasteride (PROSCAR) 5 MG tablet Take 5 mg by mouth daily.    fluticasone (FLONASE) 50 MCG/ACT nasal spray Place into both nostrils daily.   mometasone (ASMANEX) 220 MCG/INH inhaler Inhale 1 puff into the lungs 2 (two) times daily.   Multiple Vitamin (MULTIVITAMIN) tablet Take 1 tablet by mouth daily.   omeprazole (PRILOSEC) 20 MG capsule Take 20 mg by mouth 2 (two) times daily.   rosuvastatin (CRESTOR) 20 MG tablet TAKE 1 TABLET(20 MG) BY MOUTH DAILY   terazosin (HYTRIN) 5 MG capsule Take 5 mg by mouth 2 (two) times daily.   No facility-administered encounter medications on file as of 11/15/2023.    Allergies (verified) Patient has no known allergies.   History: Past Medical History:  Diagnosis Date   Arthritis    Asthma    BPH (benign prostatic hypertrophy)    Essential hypertension, malignant 04/22/2020   GERD (gastroesophageal reflux disease)    Hiatal hernia neck pain and back pain    Reflux    Sepsis secondary to UTI (HCC) 10/14/2020   SIRS (systemic inflammatory response syndrome) (HCC) 10/14/2020   [facility] ap[facility]    Stroke (HCC) 02/2018   rt hand has difficulty with fine movements.   Past Surgical History:  Procedure Laterality Date   ANTERIOR CERVICAL DECOMP/DISCECTOMY FUSION  10/19/2011   Procedure: ANTERIOR CERVICAL DECOMPRESSION/DISCECTOMY FUSION 2 LEVELS;  Surgeon: Tinnie Gens  Roselyn Meier;  Location: [facility];  Service: Neurosurgery;  Laterality: N/A;  CERVICAL FIVE-SIX, SIX-SEVEN ANTERIOR CERVICAL DECOMPRESSION WITH FUSION AND  INTERBODY PROTHESIS PLATING   CHOLECYSTECTOMY  2010   EYE SURGERY     LUMBAR SPINE SURGERY  05/2020   L4-5 LAMINECTOMY AND FUSION W/ PEDICLE SCREWS, TLIF, POSSIBLE ALLOGRAFT & BONE MARROW ASPIRATION. 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  Tobacco Use   Smoking status: Former    Current packs/day: 0.00    Types: Cigarettes    Quit date: 12/13/1975    Years since quitting: 47.9   Smokeless tobacco: Former    Quit date: 10/10/1983  Vaping Use   Vaping status: Never Used  Substance and Sexual Activity   Alcohol use: No   Drug use: No   Sexual activity: Not on file  Other Topics Concern   Not on file  Social History Narrative   Not on file   Social Determinants of Health   Financial Resource Strain: Low Risk  (10/23/2023)   Overall Financial Resource Strain (CARDIA)    Difficulty of Paying Living Expenses: Not hard at all  Food Insecurity: No Food Insecurity (11/15/2023)   Hunger Vital Sign    Worried About Running Out of Food in the Last Year: Never true    Ran Out of Food in the Last Year: Never true  Transportation Needs: No Transportation Needs (11/15/2023)   PRAPARE - Administrator, Civil Service (Medical): No    Lack of Transportation (Non-Medical): No  Physical Activity: Inactive (11/15/2023)   Exercise Vital Sign    Days of Exercise per Week: 0 days    Minutes of Exercise per Session: 0 min  Stress: No Stress Concern Present (11/15/2023)   [employer] of Occupational Health - Occupational Stress Questionnaire    Feeling of Stress : Not at all  Social Connections: Socially Isolated (11/15/2023)   Social Connection and Isolation Panel [NHANES]    Frequency of Communication with Friends and Family: More than three times a week    Frequency of Social Gatherings with Friends and Family: Twice a week    Attends Religious Services: Never    Database administrator or Organizations: No    Attends Tax inspector Meetings: Never    Marital Status: Widowed    Tobacco Counseling Counseling given: Not Answered   Clinical Intake:     Pain : No/denies pain Pain Score: 0-No pain     BMI - recorded: 28.98 Nutritional Status: BMI 25 -29 Overweight Nutritional Risks: None Diabetes: No  How often do you need to have someone help you when you read instructions, pamphlets, or other written materials from your doctor or pharmacy?: 1 - Never What is the last grade level you completed in school?: Some College  Interpreter Needed?: No      Activities  of Daily Living    11/15/2023    8:56 AM  In your present state of health, do you have any difficulty performing the following activities:  Hearing? 1  Vision? 0  Difficulty concentrating or making decisions? 0  Walking or climbing stairs? 1  Dressing or bathing? 0  Doing errands, shopping? 0  Preparing Food and eating ? N  Using the Toilet? N  In the past six months, have you accidently leaked urine? N  Do you have problems with loss of bowel control? N  Managing your Medications? N  Managing your Finances? N  Housekeeping or managing your Housekeeping? N    Patient Care Team: CoxFritzi Mandes, MD as PCP - General (Family Medicine)  Indicate any recent Medical Services you may have received from other than [facility] providers in the past year (date may be approximate).     Assessment:   This is a routine wellness examination for [place].  Hearing/Vision screen No results found.   Goals Addressed   None   Depression Screen    11/15/2023    8:55 AM 05/01/2023    8:12 AM 11/11/2022   11:03 AM 11/02/2021    8:11 PM 05/04/2021   10:25 AM 02/03/2021   10:25 AM 03/25/2020    1:27 PM  PHQ 2/9 Scores  PHQ - 2 Score 0 0 0 0 0 0 0    Fall Risk    11/15/2023    8:55 AM 05/01/2023    8:11 AM 11/11/2022   11:06 AM 11/17/2021    8:23 PM 05/04/2021   10:25 AM  Fall Risk   Falls in the past year? 0 0 0 1 0  Number falls in past yr:  0 0 0 1 0  Injury with Fall? 0 0 0 0 0  Risk for fall due to : History of fall(s) History of fall(s) History of fall(s) History of fall(s);Impaired balance/gait   Follow up Falls evaluation completed Falls evaluation completed;Falls prevention discussed Falls evaluation completed;Falls prevention discussed Falls evaluation completed;Education provided     MEDICARE RISK AT HOME: Medicare Risk at Home Any stairs in or around the home?: Yes If so, are there any without handrails?: Yes Home free of loose throw rugs in walkways, pet beds, electrical cords, etc?: Yes Adequate lighting in your home to reduce risk of falls?: Yes Life alert?: No Use of a cane, walker or w/c?: Yes Grab bars in the bathroom?: Yes Shower chair or bench in shower?: Yes Elevated toilet seat or a handicapped toilet?: No  TIMED UP AND GO:  Was the test performed?  Yes  Length of time to ambulate 10 feet: 15 sec Gait slow and steady with assistive device    Cognitive Function:    10/24/2022   12:01 PM  MMSE - Mini Mental State Exam  Orientation to time 5  Orientation to Place 5  Registration 3  Attention/ Calculation 5  Recall 3  Language- name 2 objects 2  Language- repeat 1  Language- follow 3 step command 3  Language- read & follow direction 1  Write a sentence 1  Copy design 1  Total score 30        11/15/2023    9:03 AM  6CIT Screen  What Year? 0 points  What month? 0 points  What time? 0 points  Count back from 20 0 points  Months in reverse 0 points  Repeat phrase 0 points  Total Score 0 points    Immunizations Immunization  History  Administered Date(s) Administered   Fluad Quad(high Dose 65+) 08/24/2020, 10/04/2021, 10/24/2022   Influenza Split 08/12/2014   Influenza, High Dose Seasonal PF 08/27/2010, 08/26/2011, 08/27/2012, 09/11/2016, 09/11/2017, 09/11/2018   Influenza-Unspecified 01/12/2003, 10/03/2003, 09/11/2005, 08/18/2006, 08/13/2007, 08/12/2008, 08/12/2009, 09/13/2013,  09/12/2019, 08/12/2020   Moderna Covid-19 Vaccine Bivalent Booster 9yrs & up 10/04/2021   Moderna SARS-COV2 Booster Vaccination 11/10/2020, 05/04/2021   Moderna Sars-Covid-2 Vaccination 02/06/2020, 03/09/2020, 11/10/2020, 02/05/2021   Pfizer(Comirnaty)Fall Seasonal Vaccine 12 years and older 10/24/2022   Pneumococcal Conjugate-13 03/12/2014   Pneumococcal Polysaccharide-23 08/24/2020   Pneumococcal-Unspecified 01/12/2003   Rsv, Bivalent, Protein Subunit Rsvpref,pf Verdis Frederickson) 12/22/2022   Tdap 06/29/2016    TDAP status: Up to date  Flu Vaccine status: Declined, Education has been provided regarding the importance of this vaccine but patient still declined. Advised may receive this vaccine at local pharmacy or Health Dept. Aware to provide a copy of the vaccination record if obtained from local pharmacy or Health Dept. Verbalized acceptance and understanding.  Pneumococcal vaccine status: Up to date  Covid-19 vaccine status: Completed vaccines  Qualifies for Shingles Vaccine? Yes   Zostavax completed No   Shingrix Completed?: No.    Education has been provided regarding the importance of this vaccine. Patient has been advised to call insurance company to determine out of pocket expense if they have not yet received this vaccine. Advised may also receive vaccine at local pharmacy or Health Dept. Verbalized acceptance and understanding.  Screening Tests Health Maintenance  Topic Date Due   Zoster Vaccines- Shingrix (1 of 2) Never done   COVID-19 Vaccine (6 - 2023-24 season) 08/13/2023   INFLUENZA VACCINE  03/11/2024 (Originally 07/13/2023)   Medicare Annual Wellness (AWV)  11/14/2024   DTaP/Tdap/Td (2 - Td or Tdap) 06/29/2026   Pneumonia Vaccine 106+ Years old  Completed   HPV VACCINES  Aged Out    Health Maintenance  Health Maintenance Due  Topic Date Due   Zoster Vaccines- Shingrix (1 of 2) Never done   COVID-19 Vaccine (6 - 2023-24 season) 08/13/2023    Colorectal cancer  screening: No longer required.   Lung Cancer Screening: (Low Dose CT Chest recommended if Age 44-80 years, 20 pack-year currently smoking OR have quit w/in 15years.) does not qualify.     Additional Screening:  Hepatitis C Screening: does not qualify;   Vision Screening: Recommended annual ophthalmology exams for early detection of glaucoma and other disorders of the eye. Is the patient up to date with their annual eye exam?  Yes  Who is the provider or what is the name of the office in which the patient attends annual eye exams? [place] If pt is not established with a provider, would they like to be referred to a provider to establish care? No .   Dental Screening: Recommended annual dental exams for proper oral hygiene    Community Resource Referral / Chronic Care Management: CRR required this visit?  No   CCM required this visit?  No     Plan:     I have personally reviewed and noted the following in the patient's chart:   Medical and social history Use of alcohol, tobacco or illicit drugs  Current medications and supplements including opioid prescriptions. Patient is not currently taking opioid prescriptions. Functional ability and status Nutritional status Physical activity Advanced directives List of other physicians Hospitalizations, surgeries, and ER visits in previous 12 months Vitals Screenings to include cognitive, depression, and falls Referrals and appointments  In addition, I have reviewed  and discussed with patient certain preventive protocols, quality metrics, and best practice recommendations. A written personalized care plan for preventive services as well as general preventive health recommendations were provided to patient.     Jomarie Longs, CMA   11/15/2023     Nurse Notes: [facility] and he will get his FLU and Shingrix vaccine.

## 2024-01-12 ENCOUNTER — Other Ambulatory Visit: Payer: Self-pay | Admitting: Family Medicine

## 2024-01-12 DIAGNOSIS — R270 Ataxia, unspecified: Secondary | ICD-10-CM

## 2024-01-18 ENCOUNTER — Other Ambulatory Visit: Payer: Self-pay | Admitting: Family Medicine

## 2024-04-13 ENCOUNTER — Other Ambulatory Visit: Payer: Self-pay | Admitting: Family Medicine

## 2024-04-13 DIAGNOSIS — R270 Ataxia, unspecified: Secondary | ICD-10-CM

## 2024-04-21 NOTE — Progress Notes (Signed)
 Subjective:  Patient ID: Kyle Torres, male    DOB: [birth year]  Age: 89 y.o. MRN: [id number]  Chief Complaint  Patient presents with   Medical Management of Chronic Issues    HPI: Hyperlipidemia: Currently on Crestor  20 mg daily.   Hypertension: Currently on amlodipine  5 mg daily.  History of a stroke.  Patient has mild ataxia still.  He currently takes aspirin , plavix , and his statin medicines. Right sided leg weakness with difficulty controlling. Balance is off. Exercises 4 times daily with walker.   GERD: Currently on omeprazole 20 mg twice daily.  Has not been able to cut it down.   Anemia: Mild.  We will recheck today.  Anemia is macrocytic.  Asthma: Currently on Asmanex   one inhalation daily.  Takes albuterol  as needed.  Also is on Flonase  nasal spray.  Began with cough and runny nose yesterday. No fevers, chills, or sweats. No sore throat.   BPH: On Proscar  and terazosin . No difficulty urinating. Has to get up once at night.   OSA: not using cpap.      04/22/2024    8:40 AM 11/15/2023    8:55 AM 05/01/2023    8:12 AM 11/11/2022   11:03 AM 11/02/2021    8:11 PM  Depression screen PHQ 2/9  Decreased Interest 0 0 0 0 0  Down, Depressed, Hopeless 1 0 0 0 0  PHQ - 2 Score 1 0 0 0 0        04/22/2024    8:39 AM  Fall Risk   Risk for fall due to : Impaired balance/gait    Patient Care Team: Mercy Stall, MD as PCP - General (Family Medicine)   Review of Systems  Constitutional:  Negative for chills, fatigue and fever.  HENT:  Positive for congestion, rhinorrhea and sneezing. Negative for ear pain, postnasal drip, sinus pressure, sinus pain and sore throat.   Respiratory:  Positive for cough. Negative for shortness of breath.   Cardiovascular:  Negative for chest pain.  Gastrointestinal:  Negative for diarrhea, nausea and vomiting.  Neurological:  Negative for dizziness and headaches.    Current Outpatient Medications on File Prior to Visit  Medication Sig  Dispense Refill   albuterol  (PROVENTIL  HFA;VENTOLIN  HFA) 108 (90 BASE) MCG/ACT inhaler Inhale 1 puff into the lungs every 6 (six) hours as needed. FOR SHORTNESS OF BREATH     amLODipine  (NORVASC ) 5 MG tablet TAKE 1 TABLET(5 MG) BY MOUTH DAILY 90 tablet 0   aspirin  EC 81 MG tablet Take 1 tablet (81 mg total) by mouth daily. 90 tablet 3   cholecalciferol (VITAMIN D3) 25 MCG (1000 UNIT) tablet Take 1,000 Units by mouth daily.     clopidogrel  (PLAVIX ) 75 MG tablet TAKE 1 TABLET(75 MG) BY MOUTH DAILY 90 tablet 0   finasteride  (PROSCAR ) 5 MG tablet Take 5 mg by mouth daily.     fluticasone  (FLONASE ) 50 MCG/ACT nasal spray Place into both nostrils daily.     mometasone  (ASMANEX ) 220 MCG/INH inhaler Inhale 1 puff into the lungs 2 (two) times daily.     Multiple Vitamin (MULTIVITAMIN) tablet Take 1 tablet by mouth daily.     omeprazole (PRILOSEC) 20 MG capsule Take 20 mg by mouth 2 (two) times daily.     rosuvastatin  (CRESTOR ) 20 MG tablet TAKE 1 TABLET(20 MG) BY MOUTH DAILY 90 tablet 2   terazosin  (HYTRIN ) 5 MG capsule Take 5 mg by mouth 2 (two) times daily.     No current  facility-administered medications on file prior to visit.   Past Medical History:  Diagnosis Date   Arthritis    Asthma    BPH (benign prostatic hypertrophy)    Essential hypertension, malignant 04/22/2020   GERD (gastroesophageal reflux disease)    Hiatal hernia neck pain and back pain    Reflux    Sepsis secondary to UTI (HCC) 10/14/2020   SIRS (systemic inflammatory response syndrome) (HCC) 10/14/2020   Sleep apnea study was [facility]yrs [facility] at [facility]    Stroke (HCC) 02/2018   rt hand has difficulty with fine movements.   Past Surgical History:  Procedure Laterality Date   ANTERIOR CERVICAL DECOMP/DISCECTOMY FUSION  10/19/2011   Procedure: ANTERIOR CERVICAL DECOMPRESSION/DISCECTOMY FUSION 2 LEVELS;  Surgeon: Elder Greening;  Location: [facility];  Service: Neurosurgery;  Laterality: N/A;  CERVICAL FIVE-SIX,  SIX-SEVEN ANTERIOR CERVICAL DECOMPRESSION WITH FUSION AND  INTERBODY PROTHESIS PLATING   CHOLECYSTECTOMY  2010   EYE SURGERY     LUMBAR SPINE SURGERY  05/2020   L4-5 LAMINECTOMY AND FUSION W/ PEDICLE SCREWS, TLIF, POSSIBLE ALLOGRAFT & BONE MARROW ASPIRATION. 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  Tobacco Use   Smoking status: Former    Current packs/day: 0.00    Types: Cigarettes    Quit date: 12/13/1975    Years since quitting: 48.4   Smokeless tobacco: Former    Quit date: 10/10/1983  Vaping Use   Vaping status: Never Used  Substance and Sexual Activity   Alcohol use: No   Drug use: No   Sexual activity: Not on file  Other Topics Concern   Not on file  Social History Narrative   Not on file   Social Drivers of Health   Financial Resource Strain: Low Risk  (11/15/2023)   Overall Financial Resource Strain (CARDIA)    Difficulty of Paying Living Expenses: Not hard at all  Food Insecurity: No Food Insecurity (11/15/2023)   Hunger Vital Sign    Worried About Running Out of Food in the Last Year: Never true    Ran Out of Food in the Last Year: Never true  Transportation Needs: No Transportation Needs (11/15/2023)   PRAPARE - Administrator, Civil Service (Medical): No    Lack of Transportation (Non-Medical): No  Physical Activity: Inactive (11/15/2023)   Exercise Vital Sign    Days of Exercise per Week: 0 days    Minutes of Exercise per Session: 0 min  Stress: No  Stress Concern Present (11/15/2023)   [employer] of Occupational Health - Occupational Stress Questionnaire    Feeling of Stress : Not at all  Social Connections: Socially Isolated (11/15/2023)   Social Connection and Isolation Panel [NHANES]    Frequency of Communication with Friends and Family: More than three times a week    Frequency of Social Gatherings with Friends and  Family: Twice a week    Attends Religious Services: Never    Active Member of Clubs or Organizations: No    Attends Banker Meetings: Never    Marital Status: Widowed    Objective:  BP 130/64   Pulse 98   Temp 98.3 F (36.8 C)   Ht 5\' 10"  (1.778 m)   Wt 201 lb (91.2 kg)   SpO2 95%   BMI 28.84 kg/m      04/22/2024    8:21 AM 11/15/2023    8:52 AM 10/23/2023    8:33 AM  BP/Weight  Systolic BP 130 112 136  Diastolic BP 64 68 78  Wt. (Lbs) 201 202 196  BMI 28.84 kg/m2 28.98 kg/m2 28.12 kg/m2    Physical Exam Vitals reviewed.  Constitutional:      Appearance: Normal appearance.  HENT:     Right Ear: Tympanic membrane normal.     Left Ear: Tympanic membrane normal.     Nose: Congestion and rhinorrhea present.     Right Turbinates: Enlarged, swollen and pale.     Left Turbinates: Enlarged, swollen and pale.     Mouth/Throat:     Pharynx: No oropharyngeal exudate or posterior oropharyngeal erythema.  Neck:     Vascular: No carotid bruit.  Cardiovascular:     Rate and Rhythm: Normal rate and regular rhythm.     Pulses: Normal pulses.     Heart sounds: Normal heart sounds.  Pulmonary:     Effort: Pulmonary effort is normal.     Breath sounds: Normal breath sounds. No wheezing, rhonchi or rales.  Abdominal:     General: Bowel sounds are normal.     Palpations: Abdomen is soft.     Tenderness: There is no abdominal tenderness.  Neurological:     Mental Status: He is alert and oriented to person, place, and time.     Gait: Gait abnormal (uses a cane.).  Psychiatric:        Mood  and Affect: Mood normal.        Behavior: Behavior normal.     Diabetic Foot Exam - Simple   No data filed      Lab Results  Component Value Date   WBC 6.9 04/22/2024   HGB 12.3 (L) 04/22/2024   HCT 36.9 (L) 04/22/2024   PLT 218 04/22/2024   GLUCOSE 90 04/22/2024   CHOL 104 04/22/2024   TRIG 43 04/22/2024   HDL 57 04/22/2024   LDLCALC 36 04/22/2024   ALT 21 04/22/2024   AST 24 04/22/2024   NA 142 04/22/2024   K 3.9 04/22/2024   CL 104 04/22/2024   CREATININE 0.82 04/22/2024   BUN 17 04/22/2024   CO2 21 04/22/2024   TSH 1.820 10/23/2023   INR 1.2 10/13/2020      Assessment & Plan:  Essential hypertension Assessment & Plan: Well controlled.  No changes to medicines. Amlodipine  5 mg daily. Continue to work on eating a healthy diet and exercise.  Labs drawn today.    Orders: -     CBC with Differential/Platelet -     Comprehensive metabolic panel with GFR  Mixed hyperlipidemia Assessment & Plan: Well controlled.  No changes to medicines. Continue crestor  20 mg before bed. Continue to work on eating a healthy diet and exercise.  Labs drawn today.    Orders: -     Lipid panel  Ataxia Assessment & Plan: Referring to Physical Therapy.  Orders: -  Ambulatory referral to Physical Therapy  Mild intermittent asthma without complication Assessment & Plan: Asmanex  1 puff twice daily. Albuterol  inhaler as needed.   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  Orders: -     Triamcinolone  Acetonide     Meds ordered this encounter  Medications   triamcinolone  acetonide (KENALOG -40) injection 80 mg    Orders Placed This Encounter   Procedures   MR Brain Wo Contrast   CBC with Differential/Platelet   Comprehensive metabolic panel with GFR   Lipid panel   Ambulatory referral to Physical Therapy     Follow-up: Return in about 3 months (around 07/23/2024) for chronic follow up.   I,Marla I Leal-Borjas,acting as a scribe for Mercy Stall, MD.,have documented all relevant documentation on the behalf of Mercy Stall, MD,as directed by  Mercy Stall, MD while in the presence of Mercy Stall, MD.   An After Visit Summary was printed and given to the patient.  I attest that I have reviewed this visit and agree with the plan scribed by my staff.   Mercy Stall, MD [facility] [phone]

## 2024-04-22 ENCOUNTER — Ambulatory Visit (INDEPENDENT_AMBULATORY_CARE_PROVIDER_SITE_OTHER): Payer: PPO | Admitting: Family Medicine

## 2024-04-22 ENCOUNTER — Encounter: Payer: Self-pay | Admitting: Family Medicine

## 2024-04-22 VITALS — BP 130/64 | HR 98 | Temp 98.3°F | Ht 70.0 in | Wt 201.0 lb

## 2024-04-22 DIAGNOSIS — I10 Essential (primary) hypertension: Secondary | ICD-10-CM | POA: Diagnosis not present

## 2024-04-22 DIAGNOSIS — E782 Mixed hyperlipidemia: Secondary | ICD-10-CM

## 2024-04-22 DIAGNOSIS — J301 Allergic rhinitis due to pollen: Secondary | ICD-10-CM | POA: Insufficient documentation

## 2024-04-22 DIAGNOSIS — R270 Ataxia, unspecified: Secondary | ICD-10-CM

## 2024-04-22 DIAGNOSIS — J4520 Mild intermittent asthma, uncomplicated: Secondary | ICD-10-CM | POA: Diagnosis not present

## 2024-04-22 DIAGNOSIS — I69351 Hemiplegia and hemiparesis following cerebral infarction affecting right dominant side: Secondary | ICD-10-CM | POA: Diagnosis not present

## 2024-04-22 MED ORDER — TRIAMCINOLONE ACETONIDE 40 MG/ML IJ SUSP
80.0000 mg | Freq: Once | INTRAMUSCULAR | Status: AC
  Administered 2024-04-22: 80 mg via INTRAMUSCULAR

## 2024-04-23 LAB — CBC WITH DIFFERENTIAL/PLATELET
Basophils Absolute: 0 10*3/uL (ref 0.0–0.2)
Basos: 0 %
EOS (ABSOLUTE): 0.2 10*3/uL (ref 0.0–0.4)
Eos: 3 %
Hematocrit: 36.9 % — ABNORMAL LOW (ref 37.5–51.0)
Hemoglobin: 12.3 g/dL — ABNORMAL LOW (ref 13.0–17.7)
Immature Grans (Abs): 0 10*3/uL (ref 0.0–0.1)
Immature Granulocytes: 0 %
Lymphocytes Absolute: 1.5 10*3/uL (ref 0.7–3.1)
Lymphs: 22 %
MCH: 32.8 pg (ref 26.6–33.0)
MCHC: 33.3 g/dL (ref 31.5–35.7)
MCV: 98 fL — ABNORMAL HIGH (ref 79–97)
Monocytes Absolute: 0.8 10*3/uL (ref 0.1–0.9)
Monocytes: 12 %
Neutrophils Absolute: 4.3 10*3/uL (ref 1.4–7.0)
Neutrophils: 63 %
Platelets: 218 10*3/uL (ref 150–450)
RBC: 3.75 x10E6/uL — ABNORMAL LOW (ref 4.14–5.80)
RDW: 12.6 % (ref 11.6–15.4)
WBC: 6.9 10*3/uL (ref 3.4–10.8)

## 2024-04-23 LAB — COMPREHENSIVE METABOLIC PANEL WITH GFR
ALT: 21 IU/L (ref 0–44)
AST: 24 IU/L (ref 0–40)
Albumin: 3.8 g/dL (ref 3.7–4.7)
Alkaline Phosphatase: 65 IU/L (ref 44–121)
BUN/Creatinine Ratio: 21 (ref 10–24)
BUN: 17 mg/dL (ref 8–27)
Bilirubin Total: 0.4 mg/dL (ref 0.0–1.2)
CO2: 21 mmol/L (ref 20–29)
Calcium: 8.8 mg/dL (ref 8.6–10.2)
Chloride: 104 mmol/L (ref 96–106)
Creatinine, Ser: 0.82 mg/dL (ref 0.76–1.27)
Globulin, Total: 2.7 g/dL (ref 1.5–4.5)
Glucose: 90 mg/dL (ref 70–99)
Potassium: 3.9 mmol/L (ref 3.5–5.2)
Sodium: 142 mmol/L (ref 134–144)
Total Protein: 6.5 g/dL (ref 6.0–8.5)
eGFR: 85 mL/min/{1.73_m2} (ref >59)

## 2024-04-23 LAB — LIPID PANEL
Chol/HDL Ratio: 1.8 ratio (ref 0.0–5.0)
Cholesterol, Total: 104 mg/dL (ref 100–199)
HDL: 57 mg/dL (ref >39)
LDL Chol Calc (NIH): 36 mg/dL (ref 0–99)
Triglycerides: 43 mg/dL (ref 0–149)
VLDL Cholesterol Cal: 11 mg/dL (ref 5–40)

## 2024-04-24 ENCOUNTER — Ambulatory Visit: Payer: Self-pay | Admitting: Family Medicine

## 2024-04-24 DIAGNOSIS — L02811 Cutaneous abscess of head [any part, except face]: Secondary | ICD-10-CM | POA: Diagnosis not present

## 2024-04-25 DIAGNOSIS — C4442 Squamous cell carcinoma of skin of scalp and neck: Secondary | ICD-10-CM | POA: Diagnosis not present

## 2024-04-25 DIAGNOSIS — L814 Other melanin hyperpigmentation: Secondary | ICD-10-CM | POA: Diagnosis not present

## 2024-04-25 DIAGNOSIS — L578 Other skin changes due to chronic exposure to nonionizing radiation: Secondary | ICD-10-CM | POA: Diagnosis not present

## 2024-04-25 DIAGNOSIS — L570 Actinic keratosis: Secondary | ICD-10-CM | POA: Diagnosis not present

## 2024-04-27 NOTE — Assessment & Plan Note (Signed)
 Kenalog  80 mg IM #1 was given at the office.

## 2024-04-27 NOTE — Assessment & Plan Note (Signed)
 Referring to Physical Therapy.

## 2024-04-27 NOTE — Assessment & Plan Note (Signed)
Well controlled.  No changes to medicines. Amlodipine 5 mg daily. Continue to work on eating a healthy diet and exercise.  Labs drawn today.  

## 2024-04-27 NOTE — Assessment & Plan Note (Signed)
Well controlled.  No changes to medicines. Continue crestor 20 mg before bed.  Continue to work on eating a healthy diet and exercise.  Labs drawn today.   

## 2024-04-27 NOTE — Assessment & Plan Note (Signed)
 Referring to physical therapy Ordering MRI brain w/o contrast

## 2024-04-27 NOTE — Assessment & Plan Note (Signed)
Asmanex 1 puff twice daily. Albuterol inhaler as needed.

## 2024-04-28 ENCOUNTER — Other Ambulatory Visit: Payer: Self-pay | Admitting: Family Medicine

## 2024-05-02 ENCOUNTER — Ambulatory Visit (HOSPITAL_BASED_OUTPATIENT_CLINIC_OR_DEPARTMENT_OTHER)
Admission: RE | Admit: 2024-05-02 | Discharge: 2024-05-02 | Disposition: A | Discharge status: Home / Self Care | Source: Ambulatory Visit | Attending: Family Medicine | Admitting: Family Medicine

## 2024-05-02 DIAGNOSIS — I639 Cerebral infarction, unspecified: Secondary | ICD-10-CM | POA: Diagnosis not present

## 2024-05-02 DIAGNOSIS — I69351 Hemiplegia and hemiparesis following cerebral infarction affecting right dominant side: Secondary | ICD-10-CM

## 2024-05-02 DIAGNOSIS — I6782 Cerebral ischemia: Secondary | ICD-10-CM | POA: Diagnosis not present

## 2024-05-02 DIAGNOSIS — C4442 Squamous cell carcinoma of skin of scalp and neck: Secondary | ICD-10-CM | POA: Diagnosis not present

## 2024-05-02 DIAGNOSIS — Z8673 Personal history of transient ischemic attack (TIA), and cerebral infarction without residual deficits: Secondary | ICD-10-CM

## 2024-05-02 DIAGNOSIS — J329 Chronic sinusitis, unspecified: Secondary | ICD-10-CM | POA: Diagnosis not present

## 2024-05-02 DIAGNOSIS — R9089 Other abnormal findings on diagnostic imaging of central nervous system: Secondary | ICD-10-CM | POA: Diagnosis not present

## 2024-05-13 DIAGNOSIS — M6281 Muscle weakness (generalized): Secondary | ICD-10-CM | POA: Diagnosis not present

## 2024-05-13 MED ORDER — SULFAMETHOXAZOLE-TRIMETHOPRIM 800-160 MG PO TABS: 1.0000 | ORAL_TABLET | Freq: Two times a day (BID) | ORAL | 0 refills | Status: DC

## 2024-05-14 ENCOUNTER — Telehealth: Payer: Self-pay | Admitting: Family Medicine

## 2024-05-14 NOTE — Telephone Encounter (Signed)
 [facility] Visit Report for 05/13/24.

## 2024-05-17 DIAGNOSIS — M6281 Muscle weakness (generalized): Secondary | ICD-10-CM | POA: Diagnosis not present

## 2024-05-21 DIAGNOSIS — M6281 Muscle weakness (generalized): Secondary | ICD-10-CM | POA: Diagnosis not present

## 2024-05-23 DIAGNOSIS — M6281 Muscle weakness (generalized): Secondary | ICD-10-CM | POA: Diagnosis not present

## 2024-07-02 ENCOUNTER — Telehealth: Payer: Self-pay

## 2024-07-02 NOTE — Telephone Encounter (Signed)
 Patient expressed frustration with bruising easily due to blood thinner and requested to d/c medication. Patient was advised not to stop plavix  due to his hx of stroke. Per Dr. Sherre, patient can d/c ASA 81mg  to see if the bruising improves. Patient claims, I look like a clown with all my bruising. Patient agreed and verbalized understanding, advised that if no improvement with bruising after this change to discuss this at his follow up 07/25/24.   Copied from CRM #[id number]. Topic: Clinical - Medical Advice >> Jul 02, 2024  2:03 PM Sasha H wrote: Reason for CRM: Pt is wanting to stop clopidogrel  (PLAVIX ) 75 MG tablet and is wanting to speak with someone about it.

## 2024-07-03 DIAGNOSIS — Z8673 Personal history of transient ischemic attack (TIA), and cerebral infarction without residual deficits: Secondary | ICD-10-CM | POA: Diagnosis not present

## 2024-07-03 DIAGNOSIS — R2689 Other abnormalities of gait and mobility: Secondary | ICD-10-CM | POA: Diagnosis not present

## 2024-07-03 DIAGNOSIS — R42 Dizziness and giddiness: Secondary | ICD-10-CM | POA: Diagnosis not present

## 2024-07-03 DIAGNOSIS — Z7902 Long term (current) use of antithrombotics/antiplatelets: Secondary | ICD-10-CM | POA: Diagnosis not present

## 2024-07-03 DIAGNOSIS — M1990 Unspecified osteoarthritis, unspecified site: Secondary | ICD-10-CM | POA: Diagnosis not present

## 2024-07-03 DIAGNOSIS — R269 Unspecified abnormalities of gait and mobility: Secondary | ICD-10-CM | POA: Diagnosis not present

## 2024-07-03 DIAGNOSIS — N390 Urinary tract infection, site not specified: Secondary | ICD-10-CM | POA: Diagnosis not present

## 2024-07-03 DIAGNOSIS — Z8551 Personal history of malignant neoplasm of bladder: Secondary | ICD-10-CM | POA: Diagnosis not present

## 2024-07-03 DIAGNOSIS — J45909 Unspecified asthma, uncomplicated: Secondary | ICD-10-CM | POA: Diagnosis not present

## 2024-07-03 DIAGNOSIS — I252 Old myocardial infarction: Secondary | ICD-10-CM | POA: Diagnosis not present

## 2024-07-03 DIAGNOSIS — Z79899 Other long term (current) drug therapy: Secondary | ICD-10-CM | POA: Diagnosis not present

## 2024-07-03 DIAGNOSIS — Z87891 Personal history of nicotine dependence: Secondary | ICD-10-CM | POA: Diagnosis not present

## 2024-07-03 DIAGNOSIS — R531 Weakness: Secondary | ICD-10-CM | POA: Diagnosis not present

## 2024-07-03 DIAGNOSIS — K219 Gastro-esophageal reflux disease without esophagitis: Secondary | ICD-10-CM | POA: Diagnosis not present

## 2024-07-03 DIAGNOSIS — E785 Hyperlipidemia, unspecified: Secondary | ICD-10-CM | POA: Diagnosis not present

## 2024-07-03 DIAGNOSIS — N400 Enlarged prostate without lower urinary tract symptoms: Secondary | ICD-10-CM | POA: Diagnosis not present

## 2024-07-03 DIAGNOSIS — F32A Depression, unspecified: Secondary | ICD-10-CM | POA: Diagnosis not present

## 2024-07-03 DIAGNOSIS — I10 Essential (primary) hypertension: Secondary | ICD-10-CM | POA: Diagnosis not present

## 2024-07-03 LAB — LAB REPORT - SCANNED: POC INR: 1

## 2024-07-04 DIAGNOSIS — N390 Urinary tract infection, site not specified: Secondary | ICD-10-CM | POA: Diagnosis not present

## 2024-07-04 DIAGNOSIS — J45909 Unspecified asthma, uncomplicated: Secondary | ICD-10-CM | POA: Diagnosis not present

## 2024-07-04 DIAGNOSIS — R9082 White matter disease, unspecified: Secondary | ICD-10-CM | POA: Diagnosis not present

## 2024-07-04 DIAGNOSIS — I10 Essential (primary) hypertension: Secondary | ICD-10-CM | POA: Diagnosis not present

## 2024-07-04 DIAGNOSIS — I517 Cardiomegaly: Secondary | ICD-10-CM | POA: Diagnosis not present

## 2024-07-04 LAB — LAB REPORT - SCANNED: TSH: 1.29 (ref 0.41–5.90)

## 2024-07-04 LAB — BASIC METABOLIC PANEL (CC13): EGFR: 89

## 2024-07-05 ENCOUNTER — Telehealth: Payer: Self-pay

## 2024-07-05 NOTE — Transitions of Care (Post Inpatient/ED Visit) (Signed)
   07/05/2024  Name: Kyle Torres MRN: [id number] DOB: [birth year]  Today's TOC FU Call Status: Today's TOC FU Call Status:: Successful TOC FU Call Completed TOC FU Call Complete Date: 07/05/24 (spoke with patient who was reluctant to verify dob but finally did, and states he sees Dr Sherre and [facility] and does not feel he needs the calls) Patient's Name and Date of Birth confirmed.  Shona Prow RN, CCM [place]  [facility] RN Care Manager [phone]

## 2024-07-05 NOTE — Transitions of Care (Post Inpatient/ED Visit) (Signed)
   07/05/2024  Name: Kyle Torres MRN: [id number] DOB: 04-May-1934  Today's TOC FU Call Status: Today's TOC FU Call Status:: Unsuccessful Call (1st Attempt) Unsuccessful Call (1st Attempt) Date: 07/05/24  Attempted to reach the patient regarding the most recent Inpatient/ED visit.  Follow Up Plan: Additional outreach attempts will be made to reach the patient to complete the Transitions of Care (Post Inpatient/ED visit) call.   Shona Prow RN, CCM [place]  [facility]Population Health RN Care Manager [phone]

## 2024-07-09 ENCOUNTER — Telehealth: Payer: Self-pay

## 2024-07-09 ENCOUNTER — Ambulatory Visit (INDEPENDENT_AMBULATORY_CARE_PROVIDER_SITE_OTHER): Admitting: Family Medicine

## 2024-07-09 VITALS — BP 134/68 | HR 82 | Temp 98.1°F | Ht 70.0 in | Wt 197.0 lb

## 2024-07-09 DIAGNOSIS — N3000 Acute cystitis without hematuria: Secondary | ICD-10-CM | POA: Diagnosis not present

## 2024-07-09 DIAGNOSIS — I10 Essential (primary) hypertension: Secondary | ICD-10-CM

## 2024-07-09 DIAGNOSIS — Z8673 Personal history of transient ischemic attack (TIA), and cerebral infarction without residual deficits: Secondary | ICD-10-CM | POA: Diagnosis not present

## 2024-07-09 DIAGNOSIS — R270 Ataxia, unspecified: Secondary | ICD-10-CM

## 2024-07-09 DIAGNOSIS — N3001 Acute cystitis with hematuria: Secondary | ICD-10-CM

## 2024-07-09 DIAGNOSIS — E782 Mixed hyperlipidemia: Secondary | ICD-10-CM | POA: Diagnosis not present

## 2024-07-09 MED ORDER — AMLODIPINE BESYLATE 10 MG PO TABS
10.0000 mg | ORAL_TABLET | Freq: Every day | ORAL | 0 refills | Status: DC
Start: 2024-07-09 — End: 2024-08-19

## 2024-07-09 NOTE — Progress Notes (Signed)
 Subjective:  Patient ID: Kyle Torres, male    DOB: 06/18/1934  Age: 89 y.o. MRN: [id number]  No chief complaint on file.   HPI:     04/22/2024    8:40 AM 11/15/2023    8:55 AM 05/01/2023    8:12 AM 11/11/2022   11:03 AM 11/02/2021    8:11 PM  Depression screen PHQ 2/9  Decreased Interest 0 0 0 0 0  Down, Depressed, Hopeless 1 0 0 0 0  PHQ - 2 Score 1 0 0 0 0        04/22/2024    8:39 AM  Fall Risk   Risk for fall due to : Impaired balance/gait    Patient Care Team: Sherre Clapper, MD as PCP - General (Family Medicine)   Review of Systems  Constitutional:  Negative for chills, diaphoresis, fatigue and fever.  HENT:  Negative for congestion, ear pain and sore throat.   Respiratory:  Negative for cough and shortness of breath.   Cardiovascular:  Negative for chest pain and leg swelling.  Gastrointestinal:  Negative for abdominal pain, constipation, diarrhea, nausea and vomiting.  Genitourinary:  Negative for dysuria and urgency.  Musculoskeletal:  Negative for arthralgias and myalgias.  Neurological:  Negative for dizziness and headaches.  Psychiatric/Behavioral:  Negative for dysphoric mood.     Current Outpatient Medications on File Prior to Visit  Medication Sig Dispense Refill   albuterol  (PROVENTIL  HFA;VENTOLIN  HFA) 108 (90 BASE) MCG/ACT inhaler Inhale 1 puff into the lungs every 6 (six) hours as needed. FOR SHORTNESS OF BREATH     amLODipine  (NORVASC ) 5 MG tablet TAKE 1 TABLET(5 MG) BY MOUTH DAILY 90 tablet 0   cholecalciferol (VITAMIN D3) 25 MCG (1000 UNIT) tablet Take 1,000 Units by mouth daily.     clopidogrel  (PLAVIX ) 75 MG tablet TAKE 1 TABLET(75 MG) BY MOUTH DAILY 90 tablet 0   finasteride  (PROSCAR ) 5 MG tablet Take 5 mg by mouth daily.     fluticasone  (FLONASE ) 50 MCG/ACT nasal spray Place into both nostrils daily.     mometasone  (ASMANEX ) 220 MCG/INH inhaler Inhale 1 puff into the lungs 2 (two) times daily.     Multiple Vitamin (MULTIVITAMIN) tablet Take  1 tablet by mouth daily.     omeprazole (PRILOSEC) 20 MG capsule Take 20 mg by mouth 2 (two) times daily.     rosuvastatin  (CRESTOR ) 20 MG tablet TAKE 1 TABLET(20 MG) BY MOUTH DAILY 90 tablet 2   sulfamethoxazole -trimethoprim  (BACTRIM  DS) 800-160 MG tablet Take 1 tablet by mouth 2 (two) times daily. 60 tablet 0   terazosin  (HYTRIN ) 5 MG capsule Take 5 mg by mouth 2 (two) times daily.     No current facility-administered medications on file prior to visit.   Past Medical History:  Diagnosis Date   Arthritis    Asthma    BPH (benign prostatic hypertrophy)    Essential hypertension, malignant 04/22/2020   GERD (gastroesophageal reflux disease)    Hiatal hernia neck pain and back pain    Reflux    Sepsis secondary to UTI (HCC) 10/14/2020   SIRS (systemic inflammatory response syndrome) (HCC) 10/14/2020   [facility] ap[facility]yrs [facility]    Stroke (HCC) 02/2018   rt hand has difficulty with fine movements.   Past Surgical History:  Procedure Laterality Date   ANTERIOR CERVICAL DECOMP/DISCECTOMY FUSION  10/19/2011   Procedure: ANTERIOR CERVICAL DECOMPRESSION/DISCECTOMY FUSION 2 LEVELS;  Surgeon: Reyes JONETTA Budge;  Location: [facility];  Service: Neurosurgery;  Laterality: N/A;  CERVICAL FIVE-SIX, SIX-SEVEN ANTERIOR CERVICAL DECOMPRESSION WITH FUSION AND  INTERBODY PROTHESIS PLATING   CHOLECYSTECTOMY  2010   EYE SURGERY     LUMBAR SPINE SURGERY  05/2020   L4-5 LAMINECTOMY AND FUSION W/ PEDICLE SCREWS, TLIF, POSSIBLE ALLOGRAFT & BONE MARROW ASPIRATION. 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  Tobacco Use   Smoking status: Former    Current packs/day: 0.00    Types: Cigarettes    Quit date: 12/13/1975    Years since quitting: 48.6   Smokeless tobacco: Former    Quit date: 10/10/1983  Vaping Use   Vaping status: Never Used  Substance and Sexual Activity   Alcohol use: No   Drug use: No   Sexual activity: Not on file  Other Topics Concern   Not on file  Social History Narrative   Not on file   Social Drivers of Health   Financial Resource Strain: Low Risk  (11/15/2023)   Overall Financial Resource Strain (CARDIA)    Difficulty of Paying Living Expenses: Not hard at all  Food Insecurity: No Food Insecurity (11/15/2023)   Hunger Vital Sign    Worried About Running Out of Food in the Last Year: Never true    Ran Out of Food in the Last Year: Never true  Transportation Needs: No Transportation Needs (11/15/2023)   PRAPARE - Administrator, Civil Service (Medical): No    Lack of Transportation (Non-Medical): No  Physical Activity: Inactive (11/15/2023)   Exercise Vital Sign    Days of Exercise per Week: 0 days    Minutes of Exercise per Session: 0 min  Stress: No Stress Concern Present (11/15/2023)   [employer] of Occupational Health - Occupational Stress Questionnaire    Feeling of Stress : Not at all  Social Connections: Socially Isolated (11/15/2023)   Social Connection and Isolation Panel    Frequency of Communication with Friends and Family: More than three times a week    Frequency of Social Gatherings with Friends and Family: Twice a week    Attends Religious Services: Never    Database administrator or Organizations: No    Attends Banker Meetings: Never    Marital Status: Widowed    Objective:  There were no vitals taken for this visit.     04/22/2024    8:21 AM 11/15/2023    8:52 AM 10/23/2023    8:33  AM  BP/Weight  Systolic BP 130 112 136  Diastolic BP 64 68 78  Wt. (Lbs) 201 202 196  BMI 28.84 kg/m2 28.98 kg/m2 28.12 kg/m2    Physical Exam      Lab Results  Component Value Date   WBC 6.9 04/22/2024   HGB 12.3 (L) 04/22/2024   HCT 36.9 (L) 04/22/2024   PLT 218  04/22/2024   GLUCOSE 90 04/22/2024   CHOL 104 04/22/2024   TRIG 43 04/22/2024   HDL 57 04/22/2024   LDLCALC 36 04/22/2024   ALT 21 04/22/2024   AST 24 04/22/2024   NA 142 04/22/2024   K 3.9 04/22/2024   CL 104 04/22/2024   CREATININE 0.82 04/22/2024   BUN 17 04/22/2024   CO2 21 04/22/2024   TSH 1.820 10/23/2023   INR 1.2 10/13/2020      Assessment & Plan:  There are no diagnoses linked to this encounter.   No orders of the defined types were placed in this encounter.   No orders of the defined types were placed in this encounter.    Follow-up: No follow-ups on file.   I,Dashay Giesler A Adysen Raphael,acting as a scribe for Abigail Free, MD.,have documented all relevant documentation on the behalf of Abigail Free, MD,as directed by  Abigail Free, MD while in the presence of Abigail Free, MD.   An After Visit Summary was printed and given to the patient.  Abigail Free, MD [facility] [phone]

## 2024-07-09 NOTE — Telephone Encounter (Signed)
 Leeroy from [facility] called to verify that Dr.Cox is PCP of patient and would sign home health orders. Verbalized Provider is PCP of patient and orders can be sent to provider.   Copied from CRM #[id number]. Topic: Clinical - Medical Advice >> Jul 05, 2024  9:46 AM Everette C wrote: Reason for CRM: Ann with [facility] would like to please be contacted at [phone] when available to discuss a potential submission of orders for home health services/PT

## 2024-07-09 NOTE — Patient Instructions (Signed)
 VISIT SUMMARY:  During your visit, we discussed your recent dizziness and balance issues, hypertension, and urinary tract infection. We also reviewed your current medications and overall health maintenance.  YOUR PLAN:  DIZZINESS AND BALANCE IMPAIRMENT: You experienced sudden dizziness that improved with sitting but recurred when standing or walking. Your balance has been better since your recent hospital visit, but it is still not perfect. -We will refer you to [facility] for a physical therapy evaluation and treatment. -If your insurance allows, we will consider home health physical therapy.  HYPERTENSION: Your blood pressure was high during your hospital visit, and you are currently taking Norvasc  for control. -We will increase your amlodipine  dose to 10 mg daily. You should take two 5 mg tablets each day. -We will send the prescription to the [facility] pharmacy in [place], [place] .  URINARY TRACT INFECTION: You were diagnosed with a urinary tract infection and have completed 7 out of 10 doses of your antibiotics. Your urine is now clear and regular. -Continue and complete the remaining doses of your antibiotics.  GENERAL HEALTH MAINTENANCE: Your cholesterol levels are good, your memory is intact, and there are no family concerns. -Continue with your current health maintenance routine.  FOLLOW-UP: We need to monitor your blood pressure after the medication adjustment. -Schedule a follow-up appointment in 3-4 weeks to assess your blood pressure. -Consider seeing new providers Dr. Teresia, Nola Angles, or Reda Cedar if your primary provider is unavailable.

## 2024-07-09 NOTE — Progress Notes (Signed)
 Acute Office Visit  Subjective:    Patient ID: Kyle Torres, male    DOB: Jan 08, 1934, 89 y.o.   MRN: [id number]  No chief complaint on file.  Discussed the use of AI scribe software for clinical note transcription with the patient, who gave verbal consent to proceed.  Patient is in today for hospital follow up.  Admission: 07/03/2024 Discharge  07/04/2024 Dx: UTI. Ruled out stroke.  Prescribed: cefdinir  300 mg twice daily for 5 days following discharge.   History of Present Illness   Kyle Torres is an 89 year old male who presents with dizziness and balance issues.  Dizziness and balance disturbance - Sudden onset of dizziness at 6:15 AM, initially improved after eating breakfast but recurred with ambulation - Dizziness absent while sitting, present upon standing and walking - Unusual sleep episode while watching the news - Balance issues present for the past 2-3 weeks, improved since recent hospital visit - One fall at home while attempting to sit, resulting in the walker falling on top of him - Uses a walker for mobility at home - No recent falls except for the single incident at home - No memory concerns  Hyperlipidemia - rosuvastatin  20 mg before bed.   GERD:  - on omeprazole 20 mg twice daily.   Hypertension - History of hypertension - Blood pressure measured at 183/77 mmHg during hospital visit - Currently taking Norvasc  10 mg daily for blood pressure control; medication was initially held and later restarted  Anticoagulation therapy and side effects - Currently taking Plavix  - Experiencing bruising as a side effect of Plavix   Urinary tract infection - Diagnosed with urinary tract infection - Completed 7 out of 10 doses of prescribed antibiotics - Urine currently clear and regular  Chronic sinus symptoms - Uses inhaler and nasal spray twice daily for chronic sinus issues  Medication management and functional status - Manages own medications,  including inhaler, nasal spray, and three medications from the [facility] - Receives medications by mail from the [facility] in [place], [place]  - Lives independently and drives himself       Past Medical History:  Diagnosis Date   Arthritis    Asthma    BPH (benign prostatic hypertrophy)    Essential hypertension, malignant 04/22/2020   GERD (gastroesophageal reflux disease)    Hiatal hernia neck pain and back pain    Reflux    Sepsis secondary to UTI (HCC) 10/14/2020   SIRS (systemic inflammatory response syndrome) (HCC) 10/14/2020   Sleep apnea study was [facility]yrs ago at [facility]    Stroke (HCC) 02/2018   rt hand has difficulty with fine movements.    Past Surgical History:  Procedure Laterality Date   ANTERIOR CERVICAL DECOMP/DISCECTOMY FUSION  10/19/2011   Procedure: ANTERIOR CERVICAL DECOMPRESSION/DISCECTOMY FUSION 2 LEVELS;  Surgeon: Reyes JONETTA Budge;  Location: [facility];  Service: Neurosurgery;  Laterality: N/A;  CERVICAL FIVE-SIX, SIX-SEVEN ANTERIOR CERVICAL DECOMPRESSION WITH FUSION AND  INTERBODY PROTHESIS PLATING   CHOLECYSTECTOMY  2010   EYE SURGERY     LUMBAR SPINE SURGERY  05/2020   L4-5 LAMINECTOMY AND FUSION W/ PEDICLE SCREWS, TLIF, POSSIBLE ALLOGRAFT & BONE MARROW ASPIRATION. 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  Tobacco Use   Smoking status: Former    Current packs/day: 0.00    Types: Cigarettes    Quit date: 12/13/1975    Years since  quitting: 48.6   Smokeless tobacco: Former    Quit date: 10/10/1983  Vaping Use   Vaping status: Never Used  Substance and Sexual Activity   Alcohol use: No   Drug use: No   Sexual activity: Not on file  Other Topics Concern   Not on file  Social History Narrative   Not on file   Social Drivers of Health   Financial Resource Strain: Low Risk  (11/15/2023)   Overall Financial Resource Strain (CARDIA)    Difficulty of Paying Living Expenses: Not hard at all  Food Insecurity: No Food Insecurity (11/15/2023)   Hunger Vital Sign    Worried About Running Out of Food in the Last Year: Never true    Ran Out of Food in the Last Year: Never true  Transportation Needs: No Transportation Needs (11/15/2023)   PRAPARE - Administrator, Civil Service (Medical): No    Lack of Transportation (Non-Medical): No  Physical Activity: Inactive (11/15/2023)   Exercise Vital Sign    Days of Exercise per Week: 0 days    Minutes of Exercise per Session: 0 min  Stress: No Stress Concern Present (11/15/2023)   [employer] of Occupational Health - Occupational Stress Questionnaire    Feeling of Stress : Not at all  Social Connections: Socially Isolated (11/15/2023)   Social Connection and Isolation Panel    Frequency of Communication with Friends and Family: More than three times a week    Frequency of Social Gatherings with Friends and Family: Twice a week    Attends Religious Services: Never    Database administrator or Organizations: No    Attends Banker Meetings: Never    Marital Status: Widowed  Intimate Partner Violence: Not At Risk (11/15/2023)   Humiliation, Afraid, Rape, and Kick questionnaire    Fear of Current or Ex-Partner: No    Emotionally Abused: No    Physically Abused: No    Sexually Abused: No    Outpatient Medications Prior to Visit  Medication Sig Dispense Refill   albuterol  (PROVENTIL  HFA;VENTOLIN  HFA) 108 (90 BASE) MCG/ACT inhaler Inhale 1 puff into  the lungs every 6 (six) hours as needed. FOR SHORTNESS OF BREATH     cholecalciferol (VITAMIN D3) 25 MCG (1000 UNIT) tablet Take 1,000 Units by mouth daily.     clopidogrel  (PLAVIX ) 75 MG tablet TAKE 1 TABLET(75 MG) BY MOUTH DAILY 90 tablet 0   finasteride  (PROSCAR ) 5 MG tablet Take 5 mg by mouth daily.     fluticasone  (FLONASE ) 50 MCG/ACT nasal spray Place into both nostrils daily.     mometasone  (ASMANEX ) 220 MCG/INH inhaler Inhale 1 puff into the lungs 2 (two) times daily.     Multiple Vitamin (MULTIVITAMIN) tablet Take 1 tablet by mouth daily.     omeprazole (PRILOSEC) 20 MG capsule Take 20 mg by mouth 2 (two) times daily.     rosuvastatin  (CRESTOR ) 20  MG tablet TAKE 1 TABLET(20 MG) BY MOUTH DAILY 90 tablet 2   terazosin  (HYTRIN ) 5 MG capsule Take 5 mg by mouth 2 (two) times daily.     amLODipine  (NORVASC ) 5 MG tablet TAKE 1 TABLET(5 MG) BY MOUTH DAILY 90 tablet 0   sulfamethoxazole -trimethoprim  (BACTRIM  DS) 800-160 MG tablet Take 1 tablet by mouth 2 (two) times daily. 60 tablet 0   No facility-administered medications prior to visit.    No Known Allergies  Review of Systems     Objective:        07/09/2024   11:14 AM 04/22/2024    8:21 AM 11/15/2023    8:52 AM  Vitals with BMI  Height 5' 10 5' 10 5' 10  Weight 197 lbs 201 lbs 202 lbs  BMI 28.27 28.84 28.98  Systolic 134 130 887  Diastolic 68 64 68  Pulse 82 98 98    No data found.   Physical Exam Vitals reviewed.  Constitutional:      Appearance: Normal appearance.  Neck:     Vascular: No carotid bruit.  Cardiovascular:     Rate and Rhythm: Normal rate and regular rhythm.     Heart sounds: Normal heart sounds.  Pulmonary:     Effort: Pulmonary effort is normal.     Breath sounds: Normal breath sounds. No wheezing, rhonchi or rales.  Abdominal:     General: Bowel sounds are normal.     Palpations: Abdomen is soft.     Tenderness: There is no abdominal tenderness.  Neurological:     Mental Status: He is  alert and oriented to person, place, and time.     Cranial Nerves: No cranial nerve deficit.     Coordination: Romberg sign positive. Coordination abnormal.  Psychiatric:        Mood and Affect: Mood normal.        Behavior: Behavior normal.     Health Maintenance Due  Topic Date Due   Zoster Vaccines- Shingrix (1 of 2) Never done   INFLUENZA VACCINE  07/12/2024    There are no preventive care reminders to display for this patient.   Lab Results  Component Value Date   TSH 1.820 10/23/2023   Lab Results  Component Value Date   WBC 6.9 04/22/2024   HGB 12.3 (L) 04/22/2024   HCT 36.9 (L) 04/22/2024   MCV 98 (H) 04/22/2024   PLT 218 04/22/2024   Lab Results  Component Value Date   NA 142 04/22/2024   K 3.9 04/22/2024   CO2 21 04/22/2024   GLUCOSE 90 04/22/2024   BUN 17 04/22/2024   CREATININE 0.82 04/22/2024   BILITOT 0.4 04/22/2024   ALKPHOS 65 04/22/2024   AST 24 04/22/2024   ALT 21 04/22/2024   PROT 6.5 04/22/2024   ALBUMIN 3.8 04/22/2024   CALCIUM  8.8 04/22/2024   ANIONGAP 9 10/16/2020   EGFR 85 04/22/2024   Lab Results  Component Value Date   CHOL 104 04/22/2024   Lab Results  Component Value Date   HDL 57 04/22/2024   Lab Results  Component Value Date   LDLCALC 36 04/22/2024   Lab Results  Component Value Date   TRIG 43 04/22/2024   Lab Results  Component Value Date   CHOLHDL 1.8 04/22/2024   No results found for: HGBA1C     Assessment & Plan:  Essential hypertension Assessment & Plan: Well controlled.  No changes to medicines. Amlodipine  5 mg daily. Continue to work on eating  a healthy diet and exercise.  Labs drawn today.    Orders: -     amLODIPine  Besylate; Take 1 tablet (10 mg total) by mouth daily.  Dispense: 90 tablet; Refill: 0  Mixed hyperlipidemia Assessment & Plan: Well controlled.  No changes to medicines. Continue crestor  20 mg before bed. Continue to work on eating a healthy diet and exercise.  Labs drawn today.      Ataxia Assessment & Plan: Referring to Physical Therapy.  Orders: -     Ambulatory referral to Physical Therapy  Acute cystitis without hematuria Assessment & Plan: Recommend complete cefdinir .    History of CVA (cerebrovascular accident) Assessment & Plan: Continue plavix  and aspirin .      Meds ordered this encounter  Medications   amLODipine  (NORVASC ) 10 MG tablet    Sig: Take 1 tablet (10 mg total) by mouth daily.    Dispense:  90 tablet    Refill:  0    Orders Placed This Encounter  Procedures   Ambulatory referral to Physical Therapy    Assessment and Plan    Dizziness and balance impairment Sudden dizziness resolved with sitting, recurred on standing. No stroke on MRI. Balance improved post-hospital visit but still impaired. - Refer to [facility] for physical therapy evaluation and treatment. - Consider home health physical therapy if insurance allows.  Hypertension Blood pressure 183/77 at admission. On Norvasc , dose adjustment needed post-stroke concern. - Increase amlodipine  to 10 mg daily, taking two 5 mg tablets. - Send prescription to [facility] pharmacy in [place], [place] .  Urinary tract infection UTI diagnosed during hospital visit, on antibiotics with 7 of 10 doses completed. Urine clear and regular.  General Health Maintenance Cholesterol levels good, memory intact, no family concerns.  Follow-up Follow-up needed for blood pressure control post-medication adjustment. Encouraged to establish care with new providers. - Schedule follow-up appointment in 3-4 weeks to assess blood pressure. - Consider seeing new providers Dr. Teresia, Nola Angles, or Reda Cedar if primary provider is unavailable.  Recording duration: 17 minutes       Follow-up: Return in about 3 weeks (around 07/30/2024) for brady, Dina for htn followup.  An After Visit Summary was printed and given to the patient.  I attest that I have reviewed this visit and agree  with the plan scribed by my staff.   Abigail Free, MD [facility] [phone]

## 2024-07-10 ENCOUNTER — Encounter: Payer: Self-pay | Admitting: Family Medicine

## 2024-07-10 DIAGNOSIS — N400 Enlarged prostate without lower urinary tract symptoms: Secondary | ICD-10-CM | POA: Diagnosis not present

## 2024-07-10 DIAGNOSIS — I69351 Hemiplegia and hemiparesis following cerebral infarction affecting right dominant side: Secondary | ICD-10-CM | POA: Diagnosis not present

## 2024-07-10 DIAGNOSIS — I10 Essential (primary) hypertension: Secondary | ICD-10-CM | POA: Diagnosis not present

## 2024-07-10 DIAGNOSIS — Z8551 Personal history of malignant neoplasm of bladder: Secondary | ICD-10-CM | POA: Diagnosis not present

## 2024-07-10 DIAGNOSIS — Z7902 Long term (current) use of antithrombotics/antiplatelets: Secondary | ICD-10-CM | POA: Diagnosis not present

## 2024-07-10 DIAGNOSIS — K5790 Diverticulosis of intestine, part unspecified, without perforation or abscess without bleeding: Secondary | ICD-10-CM | POA: Diagnosis not present

## 2024-07-10 DIAGNOSIS — E782 Mixed hyperlipidemia: Secondary | ICD-10-CM | POA: Diagnosis not present

## 2024-07-10 DIAGNOSIS — Z556 Problems related to health literacy: Secondary | ICD-10-CM | POA: Diagnosis not present

## 2024-07-10 DIAGNOSIS — I252 Old myocardial infarction: Secondary | ICD-10-CM | POA: Diagnosis not present

## 2024-07-10 DIAGNOSIS — J4520 Mild intermittent asthma, uncomplicated: Secondary | ICD-10-CM | POA: Diagnosis not present

## 2024-07-10 DIAGNOSIS — Z87891 Personal history of nicotine dependence: Secondary | ICD-10-CM | POA: Diagnosis not present

## 2024-07-10 DIAGNOSIS — I440 Atrioventricular block, first degree: Secondary | ICD-10-CM | POA: Diagnosis not present

## 2024-07-10 DIAGNOSIS — D539 Nutritional anemia, unspecified: Secondary | ICD-10-CM | POA: Diagnosis not present

## 2024-07-10 DIAGNOSIS — M1990 Unspecified osteoarthritis, unspecified site: Secondary | ICD-10-CM | POA: Diagnosis not present

## 2024-07-10 DIAGNOSIS — N390 Urinary tract infection, site not specified: Secondary | ICD-10-CM | POA: Diagnosis not present

## 2024-07-10 DIAGNOSIS — G4733 Obstructive sleep apnea (adult) (pediatric): Secondary | ICD-10-CM | POA: Diagnosis not present

## 2024-07-10 DIAGNOSIS — F32A Depression, unspecified: Secondary | ICD-10-CM | POA: Diagnosis not present

## 2024-07-10 DIAGNOSIS — Z9181 History of falling: Secondary | ICD-10-CM | POA: Diagnosis not present

## 2024-07-10 DIAGNOSIS — K219 Gastro-esophageal reflux disease without esophagitis: Secondary | ICD-10-CM | POA: Diagnosis not present

## 2024-07-14 NOTE — Assessment & Plan Note (Signed)
Well controlled.  No changes to medicines. Amlodipine 5 mg daily. Continue to work on eating a healthy diet and exercise.  Labs drawn today.  

## 2024-07-14 NOTE — Assessment & Plan Note (Signed)
 Referring to Physical Therapy.

## 2024-07-14 NOTE — Assessment & Plan Note (Signed)
 Continue plavix and aspirin.

## 2024-07-14 NOTE — Assessment & Plan Note (Signed)
 Recommend complete cefdinir .

## 2024-07-14 NOTE — Assessment & Plan Note (Signed)
Well controlled.  No changes to medicines. Continue crestor 20 mg before bed.  Continue to work on eating a healthy diet and exercise.  Labs drawn today.   

## 2024-07-22 DIAGNOSIS — K219 Gastro-esophageal reflux disease without esophagitis: Secondary | ICD-10-CM | POA: Diagnosis not present

## 2024-07-22 DIAGNOSIS — E782 Mixed hyperlipidemia: Secondary | ICD-10-CM | POA: Diagnosis not present

## 2024-07-22 DIAGNOSIS — I10 Essential (primary) hypertension: Secondary | ICD-10-CM | POA: Diagnosis not present

## 2024-07-22 DIAGNOSIS — J4520 Mild intermittent asthma, uncomplicated: Secondary | ICD-10-CM | POA: Diagnosis not present

## 2024-07-22 DIAGNOSIS — N400 Enlarged prostate without lower urinary tract symptoms: Secondary | ICD-10-CM | POA: Diagnosis not present

## 2024-07-22 DIAGNOSIS — G4733 Obstructive sleep apnea (adult) (pediatric): Secondary | ICD-10-CM | POA: Diagnosis not present

## 2024-07-22 DIAGNOSIS — M1990 Unspecified osteoarthritis, unspecified site: Secondary | ICD-10-CM | POA: Diagnosis not present

## 2024-07-22 DIAGNOSIS — I440 Atrioventricular block, first degree: Secondary | ICD-10-CM | POA: Diagnosis not present

## 2024-07-22 DIAGNOSIS — Z8551 Personal history of malignant neoplasm of bladder: Secondary | ICD-10-CM | POA: Diagnosis not present

## 2024-07-22 DIAGNOSIS — K5790 Diverticulosis of intestine, part unspecified, without perforation or abscess without bleeding: Secondary | ICD-10-CM | POA: Diagnosis not present

## 2024-07-22 DIAGNOSIS — I252 Old myocardial infarction: Secondary | ICD-10-CM | POA: Diagnosis not present

## 2024-07-22 DIAGNOSIS — D539 Nutritional anemia, unspecified: Secondary | ICD-10-CM | POA: Diagnosis not present

## 2024-07-23 ENCOUNTER — Encounter: Payer: Self-pay | Admitting: Internal Medicine

## 2024-07-25 ENCOUNTER — Encounter: Payer: Self-pay | Admitting: Family Medicine

## 2024-07-25 ENCOUNTER — Ambulatory Visit (INDEPENDENT_AMBULATORY_CARE_PROVIDER_SITE_OTHER): Admitting: Family Medicine

## 2024-07-25 ENCOUNTER — Other Ambulatory Visit: Payer: Self-pay | Admitting: Family Medicine

## 2024-07-25 ENCOUNTER — Telehealth: Payer: Self-pay

## 2024-07-25 VITALS — BP 122/72 | HR 88 | Temp 97.8°F | Ht 70.0 in | Wt 197.0 lb

## 2024-07-25 DIAGNOSIS — E782 Mixed hyperlipidemia: Secondary | ICD-10-CM

## 2024-07-25 DIAGNOSIS — I69351 Hemiplegia and hemiparesis following cerebral infarction affecting right dominant side: Secondary | ICD-10-CM | POA: Diagnosis not present

## 2024-07-25 DIAGNOSIS — I10 Essential (primary) hypertension: Secondary | ICD-10-CM

## 2024-07-25 DIAGNOSIS — J4520 Mild intermittent asthma, uncomplicated: Secondary | ICD-10-CM

## 2024-07-25 DIAGNOSIS — Z8551 Personal history of malignant neoplasm of bladder: Secondary | ICD-10-CM | POA: Diagnosis not present

## 2024-07-25 DIAGNOSIS — N401 Enlarged prostate with lower urinary tract symptoms: Secondary | ICD-10-CM | POA: Diagnosis not present

## 2024-07-25 DIAGNOSIS — R270 Ataxia, unspecified: Secondary | ICD-10-CM

## 2024-07-25 DIAGNOSIS — J301 Allergic rhinitis due to pollen: Secondary | ICD-10-CM | POA: Diagnosis not present

## 2024-07-25 DIAGNOSIS — R3911 Hesitancy of micturition: Secondary | ICD-10-CM

## 2024-07-25 NOTE — Progress Notes (Signed)
 Subjective:  Patient ID: Kyle Torres, male    DOB: 07-Aug-1934  Age: 89 y.o. MRN: [id number]  Chief Complaint  Patient presents with   Medical Management of Chronic Issues    HPI: Hyperlipidemia: Currently on Crestor  20 mg daily.   Hypertension: Currently on amlodipine  10 mg daily.  History of a stroke.  Patient has mild ataxia still.  He currently takes aspirin , plavix , and his statin medicines. Right sided leg weakness with difficulty controlling. Balance is off. Exercises 4 times daily with walker. Had physical therapy coming out, but he released them as they did not show him any new exercises. He was already doing several daily exercises.  GERD: Currently on omeprazole 20 mg twice daily.  Has not been able to cut it down.   Asthma: Currently on Asmanex   one inhalation daily.  Takes albuterol  as needed.  Also is on Flonase  nasal spray.   No fevers, chills, or sweats. No sore throat.   BPH: On Proscar  and terazosin . Occasional difficulty with urination - No other urinary issues reported  Recent upper respiratory infection - Recent upper respiratory infection - Improving     04/22/2024    8:40 AM 11/15/2023    8:55 AM 05/01/2023    8:12 AM 11/11/2022   11:03 AM 11/02/2021    8:11 PM  Depression screen PHQ 2/9  Decreased Interest 0 0 0 0 0  Down, Depressed, Hopeless 1 0 0 0 0  PHQ - 2 Score 1 0 0 0 0        04/22/2024    8:39 AM  Fall Risk   Risk for fall due to : Impaired balance/gait    Patient Care Team: Sherre Clapper, MD as PCP - General (Family Medicine)   Review of Systems  Constitutional:  Negative for chills, fatigue and fever.  HENT:  Negative for congestion, ear pain and sore throat.   Respiratory:  Negative for cough and shortness of breath.   Cardiovascular:  Negative for chest pain.  Gastrointestinal:  Negative for abdominal pain, constipation, diarrhea, nausea and vomiting.  Endocrine: Negative for polydipsia, polyphagia and polyuria.   Genitourinary:  Negative for dysuria and frequency.  Musculoskeletal:  Negative for arthralgias and myalgias.  Neurological:  Negative for dizziness and headaches.  Psychiatric/Behavioral:  Negative for dysphoric mood.        No dysphoria    Current Outpatient Medications on File Prior to Visit  Medication Sig Dispense Refill   albuterol  (PROVENTIL  HFA;VENTOLIN  HFA) 108 (90 BASE) MCG/ACT inhaler Inhale 1 puff into the lungs every 6 (six) hours as needed. FOR SHORTNESS OF BREATH     amLODipine  (NORVASC ) 10 MG tablet Take 1 tablet (10 mg total) by mouth daily. 90 tablet 0   cholecalciferol (VITAMIN D3) 25 MCG (1000 UNIT) tablet Take 1,000 Units by mouth daily.     finasteride  (PROSCAR ) 5 MG tablet Take 5 mg by mouth daily.     fluticasone  (FLONASE ) 50 MCG/ACT nasal spray Place into both nostrils daily.     mometasone  (ASMANEX ) 220 MCG/INH inhaler Inhale 1 puff into the lungs 2 (two) times daily.     Multiple Vitamin (MULTIVITAMIN) tablet Take 1 tablet by mouth daily.     omeprazole (PRILOSEC) 20 MG capsule Take 20 mg by mouth 2 (two) times daily.     rosuvastatin  (CRESTOR ) 20 MG tablet TAKE 1 TABLET(20 MG) BY MOUTH DAILY 90 tablet 2   terazosin  (HYTRIN ) 5 MG capsule Take 5 mg by mouth 2 (two)  times daily.     No current facility-administered medications on file prior to visit.   Past Medical History:  Diagnosis Date   Arthritis    Asthma    BPH (benign prostatic hypertrophy)    Essential hypertension, malignant 04/22/2020   GERD (gastroesophageal reflux disease)    Hiatal hernia neck pain and back pain    Reflux    Sepsis secondary to UTI (HCC) 10/14/2020   SIRS (systemic inflammatory response syndrome) (HCC) 10/14/2020   Sleep apnea study was [facility]yrs ago at [facility]    Stroke (HCC) 02/2018   rt hand has difficulty with fine movements.   Past Surgical History:  Procedure Laterality Date   ANTERIOR CERVICAL DECOMP/DISCECTOMY FUSION  10/19/2011   Procedure: ANTERIOR CERVICAL  DECOMPRESSION/DISCECTOMY FUSION 2 LEVELS;  Surgeon: Reyes JONETTA Budge;  Location: [facility];  Service: Neurosurgery;  Laterality: N/A;  CERVICAL FIVE-SIX, SIX-SEVEN ANTERIOR CERVICAL DECOMPRESSION WITH FUSION AND  INTERBODY PROTHESIS PLATING   CHOLECYSTECTOMY  2010   EYE SURGERY     LUMBAR SPINE SURGERY  05/2020   L4-5 LAMINECTOMY AND FUSION W/ PEDICLE SCREWS, TLIF, POSSIBLE ALLOGRAFT & BONE MARROW ASPIRATION. 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  Tobacco Use   Smoking status: Former    Current packs/day: 0.00    Types: Cigarettes    Quit date: 12/13/1975    Years since quitting: 48.6   Smokeless tobacco: Former    Quit date: 10/10/1983  Vaping Use   Vaping status: Never Used  Substance and Sexual Activity   Alcohol use: No   Drug use: No   Sexual activity: Not on file  Other Topics Concern   Not on file  Social History Narrative   Not on file   Social Drivers of Health   Financial Resource Strain: Low Risk  (11/15/2023)   Overall Financial Resource Strain (CARDIA)    Difficulty of Paying Living Expenses: Not hard at all  Food Insecurity: No Food Insecurity (11/15/2023)   Hunger Vital Sign    Worried About Running Out of Food in the Last Year: Never true    Ran Out of Food in the Last Year: Never true  Transportation Needs: No Transportation Needs (11/15/2023)   PRAPARE - Administrator, Civil Service (Medical): No    Lack of Transportation (Non-Medical): No   Physical Activity: Inactive (11/15/2023)   Exercise Vital Sign    Days of Exercise per Week: 0 days    Minutes of Exercise per Session: 0 min  Stress: No Stress Concern Present (11/15/2023)   [employer] of Occupational Health - Occupational Stress Questionnaire    Feeling of Stress : Not at all  Social Connections: Socially Isolated (11/15/2023)   Social Connection and Isolation Panel    Frequency of Communication with Friends and Family: More than three times a week  Frequency of Social Gatherings with Friends and Family: Twice a week    Attends Religious Services: Never    Database administrator or Organizations: No    Attends Banker Meetings: Never    Marital Status: Widowed    Objective:  BP 122/72 (BP Location: Left Arm, Patient Position: Sitting)   Pulse 88   Temp 97.8 F (36.6 C) (Temporal)   Ht 5' 10 (1.778 m)   Wt 197 lb (89.4 kg)   SpO2 93%   BMI 28.27 kg/m      07/25/2024    8:28 AM 07/09/2024   11:14 AM 04/22/2024    8:21 AM  BP/Weight  Systolic BP 122 134 130  Diastolic BP 72 68 64  Wt. (Lbs) 197 197 201  BMI 28.27 kg/m2 28.27 kg/m2 28.84 kg/m2    Physical Exam Vitals reviewed.  Constitutional:      Appearance: Normal appearance.  Neck:     Vascular: No carotid bruit.  Cardiovascular:     Rate and Rhythm: Normal rate and regular rhythm.     Pulses: Normal pulses.     Heart sounds: Normal heart sounds.  Pulmonary:     Effort: Pulmonary effort is normal.     Breath sounds: Normal breath sounds. No wheezing, rhonchi or rales.  Abdominal:     General: Bowel sounds are normal.     Palpations: Abdomen is soft.     Tenderness: There is no abdominal tenderness.  Neurological:     Mental Status: He is alert.     Motor: Weakness (right sided leg) present.  Psychiatric:        Mood and Affect: Mood normal.        Behavior: Behavior normal.         Lab Results  Component Value Date   WBC 6.9 04/22/2024   HGB 12.3 (L)  04/22/2024   HCT 36.9 (L) 04/22/2024   PLT 218 04/22/2024   GLUCOSE 90 04/22/2024   CHOL 104 04/22/2024   TRIG 43 04/22/2024   HDL 57 04/22/2024   LDLCALC 36 04/22/2024   ALT 21 04/22/2024   AST 24 04/22/2024   NA 142 04/22/2024   K 3.9 04/22/2024   CL 104 04/22/2024   CREATININE 0.82 04/22/2024   BUN 17 04/22/2024   CO2 21 04/22/2024   TSH 1.820 10/23/2023   INR 1.2 10/13/2020      Assessment & Plan:  Essential hypertension Assessment & Plan: Well controlled.  No changes to medicines. Amlodipine  10 mg daily. Continue to work on eating a healthy diet and exercise.  Labs drawn today.     Mixed hyperlipidemia Assessment & Plan: Well controlled.  No changes to medicines. Continue crestor  20 mg before bed. Continue to work on eating a healthy diet and exercise.  Labs drawn today.     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  Reviewed balance exercises he was doing and added some.    Mild intermittent asthma without complication Assessment & Plan: Well controlled.  Asmanex  1 puff twice daily. Albuterol  inhaler as needed.   Hemiparesis of right dominant side as late effect of cerebral infarction [facility]) Assessment & Plan: Continue plavix  and rosuvastatin .   Seasonal allergic rhinitis due to pollen Assessment & Plan: Continue flonase .    Benign prostatic hyperplasia with urinary hesitancy Assessment & Plan: The current medical regimen is effective;  continue present plan and medications. Continue proscar  and terazosin .   History of bladder cancer Assessment & Plan:  Management per specialist.  Dr. Marda.     No orders of the defined types were placed in this encounter.   No orders of the defined types were placed in this encounter.    Follow-up: Return in about 6 months (around 01/25/2025) for chronic follow up.  An After Visit Summary was printed and given to the patient.  Abigail Free, MD [facility] [phone]

## 2024-07-25 NOTE — Telephone Encounter (Signed)
 Copied from CRM [id number]. Topic: General - Other >> Jul 24, 2024  4:45 PM Deleta RAMAN wrote: Reason for CRM: sybil is calling from [facility] notifying office patient denied one of pt visits this on 8/12. Patient state he felt like he did not need therapy.

## 2024-07-25 NOTE — Patient Instructions (Signed)
 Recommend shingles vaccine series. Get at your pharmacy.  Return in September for your flu shot.

## 2024-07-28 DIAGNOSIS — Z8551 Personal history of malignant neoplasm of bladder: Secondary | ICD-10-CM | POA: Insufficient documentation

## 2024-07-28 NOTE — Assessment & Plan Note (Signed)
Continue plavix and rosuvastatin.

## 2024-07-28 NOTE — Assessment & Plan Note (Signed)
Well controlled.  No changes to medicines. Continue crestor 20 mg before bed.  Continue to work on eating a healthy diet and exercise.  Labs drawn today.   

## 2024-07-28 NOTE — Assessment & Plan Note (Signed)
 Management per specialist.  Dr. Marda.

## 2024-07-28 NOTE — Assessment & Plan Note (Signed)
The current medical regimen is effective;  continue present plan and medications. Continue proscar and terazosin.

## 2024-07-28 NOTE — Assessment & Plan Note (Signed)
 Well controlled.  Asmanex  1 puff twice daily. Albuterol  inhaler as needed.

## 2024-07-28 NOTE — Assessment & Plan Note (Signed)
 Secondary to stroke.  Reviewed balance exercises he was doing and added some.

## 2024-07-28 NOTE — Assessment & Plan Note (Signed)
Continue flonase 

## 2024-07-28 NOTE — Assessment & Plan Note (Signed)
Well controlled.  No changes to medicines. Amlodipine 10 mg daily. Continue to work on eating a healthy diet and exercise.  Labs drawn today.

## 2024-07-31 ENCOUNTER — Other Ambulatory Visit: Payer: Self-pay | Admitting: Family Medicine

## 2024-08-01 DIAGNOSIS — M25662 Stiffness of left knee, not elsewhere classified: Secondary | ICD-10-CM | POA: Diagnosis not present

## 2024-08-01 DIAGNOSIS — R270 Ataxia, unspecified: Secondary | ICD-10-CM | POA: Diagnosis not present

## 2024-08-01 DIAGNOSIS — M6281 Muscle weakness (generalized): Secondary | ICD-10-CM | POA: Diagnosis not present

## 2024-08-02 ENCOUNTER — Telehealth: Payer: Self-pay | Admitting: Family Medicine

## 2024-08-02 NOTE — Telephone Encounter (Signed)
 [facility] Plan of Care

## 2024-08-07 ENCOUNTER — Other Ambulatory Visit: Payer: Self-pay | Admitting: Family Medicine

## 2024-08-08 ENCOUNTER — Telehealth: Payer: Self-pay | Admitting: Family Medicine

## 2024-08-08 NOTE — Telephone Encounter (Signed)
 [facility] Initial Evaluation Plan of Care

## 2024-08-14 DIAGNOSIS — M6281 Muscle weakness (generalized): Secondary | ICD-10-CM | POA: Diagnosis not present

## 2024-08-14 DIAGNOSIS — R270 Ataxia, unspecified: Secondary | ICD-10-CM | POA: Diagnosis not present

## 2024-08-14 DIAGNOSIS — M25662 Stiffness of left knee, not elsewhere classified: Secondary | ICD-10-CM | POA: Diagnosis not present

## 2024-08-16 ENCOUNTER — Other Ambulatory Visit: Payer: Self-pay | Admitting: Family Medicine

## 2024-08-19 ENCOUNTER — Ambulatory Visit: Admitting: Physician Assistant

## 2024-08-19 ENCOUNTER — Encounter: Payer: Self-pay | Admitting: Physician Assistant

## 2024-08-19 VITALS — BP 142/78 | HR 81 | Temp 97.2°F | Ht 70.0 in | Wt 202.0 lb

## 2024-08-19 DIAGNOSIS — I10 Essential (primary) hypertension: Secondary | ICD-10-CM | POA: Diagnosis not present

## 2024-08-19 DIAGNOSIS — R350 Frequency of micturition: Secondary | ICD-10-CM | POA: Diagnosis not present

## 2024-08-19 DIAGNOSIS — R270 Ataxia, unspecified: Secondary | ICD-10-CM | POA: Diagnosis not present

## 2024-08-19 DIAGNOSIS — N3001 Acute cystitis with hematuria: Secondary | ICD-10-CM | POA: Diagnosis not present

## 2024-08-19 LAB — POCT URINALYSIS DIP (CLINITEK)
Glucose, UA: NEGATIVE mg/dL
Ketones, POC UA: NEGATIVE mg/dL
Nitrite, UA: NEGATIVE
POC PROTEIN,UA: 30 — AB
Spec Grav, UA: 1.02 (ref 1.010–1.025)
Urobilinogen, UA: NEGATIVE U/dL — AB
pH, UA: 6 (ref 5.0–8.0)

## 2024-08-19 MED ORDER — SULFAMETHOXAZOLE-TRIMETHOPRIM 800-160 MG PO TABS: 1.0000 | ORAL_TABLET | Freq: Two times a day (BID) | ORAL | 0 refills | Status: DC

## 2024-08-19 NOTE — Progress Notes (Signed)
 Acute Office Visit  Subjective:    Patient ID: Kyle Torres, male    DOB: 02-10-1934, 89 y.o.   MRN: [id number]  Chief Complaint  Patient presents with   UTI    HPI: Patient is in today for UTI symptoms   Discussed the use of AI scribe software for clinical note transcription with the patient, who gave verbal consent to proceed.  History of Present Illness [name] is an 89 year old male who presents with recurrent urinary tract infection symptoms.  He has been experiencing urinary symptoms for the past five to six days. This is his second UTI in the last two months, with the first being his first ever at the age of 14. He was previously treated with cefdinir , which resolved most symptoms, but now there is a recurrence of burning during urination. No blood in the urine, testicular pain, side pain, or fever.  He is currently on amlodipine  10 mg and Plavix , both of which were refilled without refills about 90 days ago. He mentions that he ran out of these medications recently and is unsure why they were stopped. He receives 98-day supplies of his medications.  He has a history of dizziness following a stroke a couple of months ago, which led to an emergency room visit. He describes an episode of waking up dizzy and unable to walk, which has improved but still persists. He is undergoing physical therapy to help with balance issues, which he finds challenging but somewhat beneficial.  He lives alone with his dog and has a Museum/gallery conservator who visits regularly. He walks his dog two to three times a day, which he believes helps maintain his mobility. He uses a walker around the house due to difficulty walking.    Past Medical History:  Diagnosis Date   Arthritis    Asthma    BPH (benign prostatic hypertrophy)    Essential hypertension, malignant 04/22/2020   GERD (gastroesophageal reflux disease)    Hiatal hernia neck pain and back pain    Reflux    Sepsis secondary to  UTI (HCC) 10/14/2020   SIRS (systemic inflammatory response syndrome) (HCC) 10/14/2020   Sleep apnea study was [facility]yrs ago at [facility]    Stroke (HCC) 02/2018   rt hand has difficulty with fine movements.    Past Surgical History:  Procedure Laterality Date   ANTERIOR CERVICAL DECOMP/DISCECTOMY FUSION  10/19/2011   Procedure: ANTERIOR CERVICAL DECOMPRESSION/DISCECTOMY FUSION 2 LEVELS;  Surgeon: Reyes JONETTA Budge;  Location: [facility];  Service: Neurosurgery;  Laterality: N/A;  CERVICAL FIVE-SIX, SIX-SEVEN ANTERIOR CERVICAL DECOMPRESSION WITH FUSION AND  INTERBODY PROTHESIS PLATING   CHOLECYSTECTOMY  2010   EYE SURGERY     LUMBAR SPINE SURGERY  05/2020   L4-5 LAMINECTOMY AND FUSION W/ PEDICLE SCREWS, TLIF, POSSIBLE ALLOGRAFT & BONE MARROW ASPIRATION. 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  Tobacco Use   Smoking status: Former    Current packs/day: 0.00    Types: Cigarettes    Quit date: 12/13/1975    Years since quitting: 48.7   Smokeless tobacco: Former    Quit date: 10/10/1983  Vaping Use   Vaping status: Never Used  Substance and Sexual Activity   Alcohol use: No   Drug use: No   Sexual activity: Not on file  Other Topics Concern   Not on file  Social History Narrative   Not on file   Social Drivers of Health   Financial Resource Strain: Low Risk  (11/15/2023)   Overall Financial Resource Strain (CARDIA)    Difficulty of Paying Living Expenses: Not hard at all   Food Insecurity: No Food Insecurity (11/15/2023)   Hunger Vital Sign    Worried About Running Out of Food in the Last Year: Never true    Ran Out of Food in the Last Year: Never true  Transportation Needs: No Transportation Needs (11/15/2023)   PRAPARE - Administrator, Civil Service (Medical): No    Lack of Transportation (Non-Medical): No  Physical Activity: Inactive (11/15/2023)   Exercise Vital Sign    Days of Exercise per Week: 0 days    Minutes of Exercise per Session: 0 min  Stress: No Stress Concern Present (11/15/2023)   [employer] of Occupational Health - Occupational Stress Questionnaire    Feeling of Stress : Not at all  Social Connections: Socially Isolated (11/15/2023)   Social Connection and Isolation Panel    Frequency of Communication with Friends and Family: More than three times a week    Frequency of Social Gatherings with Friends and Family: Twice a week    Attends Religious Services: Never    Database administrator or Organizations: No    Attends Banker Meetings: Never    Marital Status: Widowed  Intimate Partner Violence: Not At Risk (11/15/2023)   Humiliation, Afraid, Rape, and Kick questionnaire    Fear of Current or Ex-Partner: No    Emotionally Abused: No    Physically Abused: No    Sexually Abused: No    Outpatient Medications Prior to Visit  Medication Sig Dispense Refill   albuterol  (PROVENTIL  HFA;VENTOLIN  HFA) 108 (90 BASE) MCG/ACT inhaler Inhale 1 puff into the lungs every 6 (six) hours as needed. FOR SHORTNESS OF BREATH     amLODipine  (NORVASC ) 10 MG tablet Take 1 tablet (10 mg total) by mouth daily. 90 tablet 0   cholecalciferol (VITAMIN D3) 25 MCG (1000 UNIT) tablet Take 1,000 Units by mouth daily.     clopidogrel  (PLAVIX ) 75 MG tablet TAKE 1 TABLET(75 MG) BY MOUTH DAILY 90 tablet 0   finasteride  (PROSCAR ) 5 MG tablet Take 5 mg by mouth daily.     fluticasone  (FLONASE ) 50 MCG/ACT nasal spray Place into both nostrils  daily.     mometasone  (ASMANEX ) 220 MCG/INH inhaler Inhale 1 puff into the lungs 2 (two) times daily.     Multiple Vitamin (MULTIVITAMIN) tablet Take 1 tablet by mouth daily.     omeprazole (PRILOSEC) 20 MG capsule Take 20 mg by mouth 2 (two) times daily.     rosuvastatin  (CRESTOR ) 20 MG tablet TAKE 1 TABLET(20 MG) BY MOUTH DAILY 90 tablet 2   terazosin  (HYTRIN ) 5 MG capsule Take 5 mg by mouth 2 (two) times daily.     No facility-administered medications prior to visit.    No Known  Allergies  Review of Systems  Constitutional:  Negative for appetite change, fatigue and fever.  HENT:  Negative for congestion, ear pain, sinus pressure and sore throat.   Respiratory:  Negative for cough, chest tightness, shortness of breath and wheezing.   Cardiovascular:  Negative for chest pain and palpitations.  Gastrointestinal:  Negative for abdominal pain, constipation, diarrhea, nausea and vomiting.  Genitourinary:  Positive for dysuria and frequency. Negative for hematuria.  Musculoskeletal:  Negative for arthralgias, back pain, joint swelling and myalgias.  Skin:  Negative for rash.  Neurological:  Negative for dizziness, weakness and headaches.  Psychiatric/Behavioral:  Negative for dysphoric mood. The patient is not nervous/anxious.        Objective:        08/19/2024    2:51 PM 07/25/2024    8:28 AM 07/09/2024   11:14 AM  Vitals with BMI  Height 5' 10 5' 10 5' 10  Weight 202 lbs 197 lbs 197 lbs  BMI 28.98 28.27 28.27  Systolic 142 122 865  Diastolic 78 72 68  Pulse 81 88 82    Orthostatic VS for the past 72 hrs (Last 3 readings):  Patient Position BP Location  08/19/24 1451 Sitting Left Arm     Physical Exam Vitals reviewed.  Constitutional:      Appearance: Normal appearance.  Cardiovascular:     Rate and Rhythm: Normal rate and regular rhythm.     Heart sounds: Normal heart sounds.  Pulmonary:     Effort: Pulmonary effort is normal.     Breath sounds: Normal breath  sounds.  Abdominal:     General: Bowel sounds are normal.     Palpations: Abdomen is soft.     Tenderness: There is no abdominal tenderness.  Neurological:     Mental Status: He is alert and oriented to person, place, and time.  Psychiatric:        Mood and Affect: Mood normal.        Behavior: Behavior normal.     Health Maintenance Due  Topic Date Due   Zoster Vaccines- Shingrix (1 of 2) Never done   Influenza Vaccine  07/12/2024    There are no preventive care reminders to display for this patient.   Lab Results  Component Value Date   TSH 1.820 10/23/2023   Lab Results  Component Value Date   WBC 6.9 04/22/2024   HGB 12.3 (L) 04/22/2024   HCT 36.9 (L) 04/22/2024   MCV 98 (H) 04/22/2024   PLT 218 04/22/2024   Lab Results  Component Value Date   NA 142 04/22/2024   K 3.9 04/22/2024   CO2 21 04/22/2024   GLUCOSE 90 04/22/2024   BUN 17 04/22/2024   CREATININE 0.82 04/22/2024   BILITOT 0.4 04/22/2024   ALKPHOS 65 04/22/2024   AST 24 04/22/2024   ALT 21 04/22/2024   PROT 6.5 04/22/2024   ALBUMIN 3.8 04/22/2024   CALCIUM  8.8 04/22/2024   ANIONGAP 9 10/16/2020   EGFR 85 04/22/2024   Lab Results  Component Value Date   CHOL 104 04/22/2024   Lab Results  Component Value Date   HDL 57 04/22/2024   Lab Results  Component Value Date   LDLCALC 36 04/22/2024   Lab Results  Component Value Date   TRIG 43 04/22/2024   Lab Results  Component Value Date   CHOLHDL 1.8 04/22/2024   No results found for: HGBA1C     Assessment & Plan:  There are no diagnoses  linked to this encounter.  Assessment and Plan Assessment & Plan Urinary tract infection with frequency of micturition Recurrent UTI with persistent symptoms. Previous cefdinir  treatment may have been ineffective. Urinalysis shows hematuria, pyuria, and proteinuria. - Send urine sample for culture. - Prescribe Bactrim . - Advise to report if symptoms worsen or persist. - Consider urologist  referral if symptoms persist or recur.  Dizziness and gait instability Persistent dizziness and gait instability post-stroke. Physical therapy provides some benefit. - Continue physical therapy. - Encourage regular walking with his dog.  Essential hypertension Managed with amlodipine . Issues with medication refills due to lack of automatic settings. - Renew prescriptions for amlodipine  and Plavix  with refills. - Ensure medications are sent to pharmacy with appropriate refills.  Recording duration: 21 minutes   No orders of the defined types were placed in this encounter.   No orders of the defined types were placed in this encounter.    Follow-up: No follow-ups on file.  An After Visit Summary was printed and given to the patient.    I,Lauren M Auman,acting as a Neurosurgeon for [employer], [employer].,have documented all relevant documentation on the behalf of Nola Angles, PA,as directed by  Nola Angles, PA while in the presence of Nola Angles, [place].    Nola Angles, [place] [facility] [phone]

## 2024-08-20 DIAGNOSIS — N3001 Acute cystitis with hematuria: Secondary | ICD-10-CM | POA: Diagnosis not present

## 2024-08-21 DIAGNOSIS — R350 Frequency of micturition: Secondary | ICD-10-CM | POA: Insufficient documentation

## 2024-08-21 DIAGNOSIS — N3001 Acute cystitis with hematuria: Secondary | ICD-10-CM | POA: Insufficient documentation

## 2024-08-21 LAB — URINE CULTURE: Organism ID, Bacteria: NO GROWTH

## 2024-08-21 NOTE — Assessment & Plan Note (Signed)
 Persistent dizziness and gait instability post-stroke. Physical therapy provides some benefit. - Continue physical therapy. - Encourage regular walking with his dog.

## 2024-08-21 NOTE — Assessment & Plan Note (Signed)
 Managed with amlodipine . Issues with medication refills due to lack of automatic settings. - Renew prescriptions for amlodipine  and Plavix  with refills. - Ensure medications are sent to pharmacy with appropriate refills.

## 2024-08-21 NOTE — Assessment & Plan Note (Signed)
 Recurrent UTI with persistent symptoms. Previous cefdinir  treatment may have been ineffective. Urinalysis shows hematuria, pyuria, and proteinuria. - Send urine sample for culture. - Prescribe Bactrim . - Advise to report if symptoms worsen or persist. - Consider urologist referral if symptoms persist or recur.

## 2024-08-27 MED ORDER — CLOPIDOGREL BISULFATE 75 MG PO TABS: 75.0000 mg | ORAL_TABLET | Freq: Every day | ORAL | 3 refills | Status: DC

## 2024-08-27 MED ORDER — AMLODIPINE BESYLATE 10 MG PO TABS: 10.0000 mg | ORAL_TABLET | Freq: Every day | ORAL | 3 refills | Status: AC

## 2024-09-04 ENCOUNTER — Other Ambulatory Visit: Payer: Self-pay | Admitting: Family Medicine

## 2024-09-04 ENCOUNTER — Telehealth: Payer: Self-pay | Admitting: Family Medicine

## 2024-09-04 DIAGNOSIS — R350 Frequency of micturition: Secondary | ICD-10-CM

## 2024-09-04 MED ORDER — SULFAMETHOXAZOLE-TRIMETHOPRIM 800-160 MG PO TABS: 1.0000 | ORAL_TABLET | Freq: Two times a day (BID) | ORAL | 0 refills | Status: DC

## 2024-09-04 NOTE — Telephone Encounter (Signed)
 Copied from CRM [id number]. Topic: General - Other >> Sep 04, 2024 12:38 PM Zebedee SAUNDERS wrote: Reason for CRM: Pt came in on 08/19/2024 for UTI was given antibiotics, they helped but UTI has not completely gone. He would like antibiotics sent to [id number] [place], [place] - [address] AT [facility] OF [address] [place] [address] Phone: [phone] Fax: [phone]

## 2024-09-19 ENCOUNTER — Ambulatory Visit: Payer: Self-pay

## 2024-09-19 ENCOUNTER — Ambulatory Visit

## 2024-09-19 VITALS — BP 138/64 | HR 92 | Temp 97.4°F | Resp 18 | Ht 70.0 in | Wt 203.0 lb

## 2024-09-19 DIAGNOSIS — C673 Malignant neoplasm of anterior wall of bladder: Secondary | ICD-10-CM | POA: Diagnosis not present

## 2024-09-19 DIAGNOSIS — R3129 Other microscopic hematuria: Secondary | ICD-10-CM | POA: Insufficient documentation

## 2024-09-19 DIAGNOSIS — Z23 Encounter for immunization: Secondary | ICD-10-CM

## 2024-09-19 DIAGNOSIS — N3001 Acute cystitis with hematuria: Secondary | ICD-10-CM | POA: Diagnosis not present

## 2024-09-19 LAB — POCT URINALYSIS DIP (CLINITEK)
Bilirubin, UA: NEGATIVE
Glucose, UA: NEGATIVE mg/dL
Ketones, POC UA: NEGATIVE mg/dL
Nitrite, UA: NEGATIVE
POC PROTEIN,UA: 30 — AB
Spec Grav, UA: >=1.03 — AB (ref 1.010–1.025)
Urobilinogen, UA: 0.2 U/dL
pH, UA: 6 (ref 5.0–8.0)

## 2024-09-19 NOTE — Patient Instructions (Signed)
  VISIT SUMMARY: You visited us  today due to ongoing urinary symptoms. We discussed your symptoms, reviewed your medical history, and planned further evaluations and treatments.  YOUR PLAN: BLADDER IRRITATION WITH RECURRENT DYSURIA: You have been experiencing recurrent urinary symptoms without bacterial infection, suggesting bladder irritation. -We will refer you to a urologist for further evaluation and a cystoscopy to investigate the cause of your bladder irritation. -We will send your current urine sample for culture to rule out any bacterial infection. -Please seek medical attention if you develop high fevers. -Avoid using antibiotics unless a culture indicates a bacterial infection.  YOU HAD A HISTORY OF BLADDER CANCER, WHICH WAS REMOVED IN [facility] [place] 2022.  I HAVE REFERRED YOU TO UROLOGY AGAIN AT [facility].   GENERAL HEALTH MAINTENANCE: We discussed the importance of receiving the flu vaccine. -You received your flu shot today.                      Contains text generated by Abridge.                                 Contains text generated by Abridge.

## 2024-09-19 NOTE — Telephone Encounter (Signed)
 FYI Only or Action Required?: FYI only for provider.  Patient was last seen in primary care on 08/19/2024 by Milon Cleaves, PA.  Called Nurse Triage reporting Urinary Tract Infection.  Symptoms began yesterday.  Interventions attempted: Prescription medications: states has been on multiple antibiotics.  Symptoms are: gradually worsening.  Triage Disposition: See Physician Within 24 Hours  Patient/caregiver understands and will follow disposition?: Yes, will follow disposition  Copied from CRM #[id number]. Topic: Clinical - Red Word Triage >> Sep 19, 2024  8:24 AM Robinson H wrote: [facility] that prompted transfer to Nurse Triage: UTI, burning completed all medication and still having issues Reason for Disposition  All other males with painful urination  Answer Assessment - Initial Assessment Questions 1. SEVERITY: How bad is the pain?  (e.g., Scale 1-10; mild, moderate, or severe)     Just a little bit, not much 4. ONSET: When did the painful urination start?      This has been ongoing, states that it resolved for a few weeks. Pt states it started again yesterday 5. FEVER: Do you have a fever? If Yes, ask: What is your temperature, how was it measured, and when did it start?     denies 6. PAST UTI: Have you had a urine infection before? If Yes, ask: When was the last time? and What happened that time?      Yes, recently, within last few weeks 7. CAUSE: What do you think is causing the painful urination?      UTI 8. OTHER SYMPTOMS: Do you have any other symptoms? (e.g., flank pain, penis discharge, scrotal pain, blood in urine)     Frequency, burning  Pt is HOH.  Protocols used: Urination Pain - Male-A-AH

## 2024-09-19 NOTE — Assessment & Plan Note (Addendum)
 A CT abdomen from November 2021 showed:    A 13 mm enhancing mural nodule is seen within the left anterior bladder wall most in keeping with a primary urothelial carcinoma.   RECORDS INDICATE HE HAD ANTERIOR BLADDER TUMOR WHICH WAS REMOVED VIA TRANSURETHRAL RESECTION IN [facility] 2022. I COULD NOT FIND THE SURGICAL PATHOLOGY RESULT ON EPIC. - Refer to [facility] urology for further evaluation and cystoscopy to investigate bladder irritation.

## 2024-09-19 NOTE — Assessment & Plan Note (Addendum)
 Orders:    Ambulatory referral to Urology

## 2024-09-19 NOTE — Progress Notes (Signed)
 Acute Office Visit  Subjective:    Patient ID: Kyle Torres, male    DOB: 08/13/1934, 89 y.o.   MRN: [id number]  Chief Complaint  Patient presents with   Dysuria    HPI: Discussed the use of AI scribe software for clinical note transcription with the patient, who gave verbal consent to proceed.  History of Present Illness   Kyle Torres is an 89 year old male who presents with recurrent urinary symptoms despite antibiotic treatment.  Recurrent dysuria and urinary frequency - Several weeks of urinary symptoms, initially asymptomatic - Diagnosed with urinary tract infection after routine urine sample - Initial 10-day course of antibiotics cefdinir  provided temporary resolution - Symptoms recurred after 2-3 weeks, characterized by burning sensation during urination - was Prescribed Bactrim  for 10 days; symptoms persisted - Completed a third course of antibiotics yesterday without symptom resolution - Increased frequency of urination - No back pain, fever, or visible hematuria - Unable to determine urine odor due to anosmia  Urinalysis and microbiological findings - Previous urine tests showed presence of white and red blood cells - Urine cultures did not grow any bacteria   Bladder neoplasm history- THOUGH PATIENT HAD NO RECOLLECTION OF SYMPTOMS OR DIAGNOSIS, HE HAS history of bladder cancer FOR WHICH HE HAD TURBT IN MARCH 2022 - No recollection of prior urology evaluation  Functional status - Resides in [place] - Relies on others for transportation - [place]          Past Medical History:  Diagnosis Date   Arthritis    Asthma    BPH (benign prostatic hypertrophy)    Essential hypertension, malignant 04/22/2020   GERD (gastroesophageal reflux disease)    Hiatal hernia neck pain and back pain    Reflux    Sepsis secondary to UTI (HCC) 10/14/2020   SIRS (systemic inflammatory response syndrome) (HCC) 10/14/2020   Sleep  apnea study was [facility]yrs ago at [facility]    Stroke (HCC) 02/2018   rt hand has difficulty with fine movements.    Past Surgical History:  Procedure Laterality Date   ANTERIOR CERVICAL DECOMP/DISCECTOMY FUSION  10/19/2011   Procedure: ANTERIOR CERVICAL DECOMPRESSION/DISCECTOMY FUSION 2 LEVELS;  Surgeon: Reyes JONETTA Budge;  Location: [facility];  Service: Neurosurgery;  Laterality: N/A;  CERVICAL FIVE-SIX, SIX-SEVEN ANTERIOR CERVICAL DECOMPRESSION WITH FUSION AND  INTERBODY PROTHESIS PLATING   CHOLECYSTECTOMY  2010   EYE SURGERY     LUMBAR SPINE SURGERY  05/2020   L4-5 LAMINECTOMY AND FUSION W/ PEDICLE SCREWS, TLIF, POSSIBLE ALLOGRAFT & BONE MARROW ASPIRATION. 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  Tobacco Use   Smoking status: Former    Current packs/day: 0.00    Types: Cigarettes    Quit date: 12/13/1975    Years since quitting: 48.8   Smokeless tobacco: Former  Quit date: 10/10/1983  Vaping Use   Vaping status: Never Used  Substance and Sexual Activity   Alcohol use: No   Drug use: No   Sexual activity: Not on file  Other Topics Concern   Not on file  Social History Narrative   Not on file   Social Drivers of Health   Financial Resource Strain: Low Risk  (11/15/2023)   Overall Financial Resource Strain (CARDIA)    Difficulty of Paying Living Expenses: Not hard at all  Food Insecurity: No Food Insecurity (11/15/2023)   Hunger Vital Sign    Worried About Running  Out of Food in the Last Year: Never true    Ran Out of Food in the Last Year: Never true  Transportation Needs: No Transportation Needs (11/15/2023)   PRAPARE - Administrator, Civil Service (Medical): No    Lack of Transportation (Non-Medical): No  Physical Activity: Inactive (11/15/2023)   Exercise Vital Sign    Days of Exercise per Week: 0 days    Minutes of Exercise per Session: 0 min  Stress: No Stress Concern Present (11/15/2023)   [employer] of Occupational Health - Occupational Stress Questionnaire    Feeling of Stress : Not at all  Social Connections: Socially Isolated (11/15/2023)   Social Connection and Isolation Panel    Frequency of Communication with Friends and Family: More than three times a week    Frequency of Social Gatherings with Friends and Family: Twice a week    Attends Religious Services: Never    Database administrator or Organizations: No    Attends Banker Meetings: Never    Marital Status: Widowed  Intimate Partner Violence: Not At Risk (11/15/2023)   Humiliation, Afraid, Rape, and Kick questionnaire    Fear of Current or Ex-Partner: No    Emotionally Abused: No    Physically Abused: No    Sexually Abused: No    Outpatient Medications Prior to Visit  Medication Sig Dispense Refill   albuterol  (PROVENTIL  HFA;VENTOLIN  HFA) 108 (90 BASE) MCG/ACT inhaler Inhale 1 puff into the lungs every 6 (six) hours as needed. FOR SHORTNESS OF BREATH     amLODipine  (NORVASC ) 10 MG tablet Take 1 tablet (10 mg total) by mouth daily. 90 tablet 3   cholecalciferol (VITAMIN D3) 25 MCG (1000 UNIT) tablet Take 1,000 Units by mouth daily.     clopidogrel  (PLAVIX ) 75 MG tablet Take 1 tablet (75 mg total) by mouth daily. 90 tablet 3   finasteride  (PROSCAR ) 5 MG tablet Take 5 mg by mouth daily.     fluticasone  (FLONASE ) 50 MCG/ACT nasal spray Place into both nostrils daily.     mometasone  (ASMANEX ) 220 MCG/INH inhaler Inhale 1 puff into the lungs 2 (two)  times daily.     Multiple Vitamin (MULTIVITAMIN) tablet Take 1 tablet by mouth daily.     omeprazole (PRILOSEC) 20 MG capsule Take 20 mg by mouth 2 (two) times daily.     rosuvastatin  (CRESTOR ) 20 MG tablet TAKE 1 TABLET(20 MG) BY MOUTH DAILY 90 tablet 2   terazosin  (HYTRIN ) 5 MG capsule Take 5 mg by mouth 2 (two) times daily.     cefdinir  (OMNICEF ) 300 MG capsule Take 300 mg by mouth 2 (two) times daily.     sulfamethoxazole -trimethoprim  (BACTRIM  DS) 800-160 MG tablet Take 1 tablet by mouth 2 (two) times daily. 28 tablet 0   No facility-administered medications prior to visit.    No Known Allergies  Review  of Systems  Constitutional:  Negative for chills, fatigue, fever and unexpected weight change.  HENT:  Negative for congestion, ear pain, sinus pain and sore throat.   Respiratory:  Negative for cough and shortness of breath.   Cardiovascular:  Negative for chest pain and palpitations.  Gastrointestinal:  Negative for abdominal pain, blood in stool, constipation, diarrhea, nausea and vomiting.  Endocrine: Negative for polydipsia.  Genitourinary:  Positive for dysuria and frequency. Negative for difficulty urinating.  Musculoskeletal:  Negative for back pain.  Skin:  Negative for rash.  Neurological:  Negative for headaches.       Objective:        09/19/2024    9:29 AM 08/19/2024    2:51 PM 07/25/2024    8:28 AM  Vitals with BMI  Height 5' 10 5' 10 5' 10  Weight 203 lbs 202 lbs 197 lbs  BMI 29.13 28.98 28.27  Systolic 138 142 877  Diastolic 64 78 72  Pulse 92 81 88    No data found.   Physical Exam Vitals and nursing note reviewed.  Constitutional:      Appearance: Normal appearance.  HENT:     Head: Normocephalic and atraumatic.  Cardiovascular:     Rate and Rhythm: Normal rate and regular rhythm.  Pulmonary:     Effort: Pulmonary effort is normal.     Breath sounds: Normal breath sounds.  Abdominal:     General: There is no distension.     Palpations:  There is no mass.     Tenderness: There is no abdominal tenderness. There is no right CVA tenderness or left CVA tenderness.  Musculoskeletal:        General: Normal range of motion.  Skin:    General: Skin is warm.  Neurological:     General: No focal deficit present.     Mental Status: He is alert.  Psychiatric:        Mood and Affect: Mood normal.     Health Maintenance Due  Topic Date Due   Zoster Vaccines- Shingrix (1 of 2) Never done   Medicare Annual Wellness (AWV)  11/14/2024    There are no preventive care reminders to display for this patient.   Lab Results  Component Value Date   TSH 1.29 07/03/2024   Lab Results  Component Value Date   WBC 6.9 04/22/2024   HGB 12.3 (L) 04/22/2024   HCT 36.9 (L) 04/22/2024   MCV 98 (H) 04/22/2024   PLT 218 04/22/2024   Lab Results  Component Value Date   NA 142 04/22/2024   K 3.9 04/22/2024   CO2 21 04/22/2024   GLUCOSE 90 04/22/2024   BUN 17 04/22/2024   CREATININE 0.82 04/22/2024   BILITOT 0.4 04/22/2024   ALKPHOS 65 04/22/2024   AST 24 04/22/2024   ALT 21 04/22/2024   PROT 6.5 04/22/2024   ALBUMIN 3.8 04/22/2024   CALCIUM  8.8 04/22/2024   ANIONGAP 9 10/16/2020   EGFR 89.0 07/04/2024   Lab Results  Component Value Date   CHOL 104 04/22/2024   Lab Results  Component Value Date   HDL 57 04/22/2024   Lab Results  Component Value Date   LDLCALC 36 04/22/2024   Lab Results  Component Value Date   TRIG 43 04/22/2024   Lab Results  Component Value Date   CHOLHDL 1.8 04/22/2024   No results found for: HGBA1C      Results for orders placed or performed in visit on 09/19/24  POCT URINALYSIS DIP (CLINITEK)   Collection Time: 09/19/24  9:38 AM  Result Value Ref Range   Color, UA yellow yellow   Clarity, UA clear clear   Glucose, UA negative negative mg/dL   Bilirubin, UA negative negative   Ketones, POC UA negative negative mg/dL   Spec Grav, UA >=8.969 (A) 1.010 - 1.025   Blood, UA  trace-intact (A) negative   pH, UA 6.0 5.0 - 8.0   POC PROTEIN,UA =30 (A) negative, trace   Urobilinogen, UA 0.2 0.2 or 1.0 E.U./dL   Nitrite, UA Negative Negative   Leukocytes, UA Trace (A) Negative     Assessment & Plan:   Assessment & Plan Acute cystitis with hematuria Bladder irritation with recurrent dysuria Recurrent dysuria with no bacterial infection on urine cultures. Previous antibiotics ineffective, suggesting bladder irritation rather than infection.  THERE IS DOCUMENTED history of bladder cancer despite PATIENT NOT REMEMBERING ANYTHING ABOUT THIS.  RECORDS INDICATE HE HAD ANTERIOR BLADDER TUMOR WHICH WAS REMOVED VIA TRANSURETHRAL RESECTION IN [facility] 2022. I COULD NOT FIND THE SURGICAL PATHOLOGY RESULT ON EPIC. - Refer to [facility] urology for further evaluation and cystoscopy to investigate bladder irritation. - Send current urine sample for culture to rule out bacterial infection. - Advise to seek medical attention if high fevers develop. - Avoid unnecessary antibiotic use unless culture indicates bacterial growth. 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   RECORDS INDICATE HE HAD ANTERIOR BLADDER TUMOR WHICH WAS REMOVED VIA TRANSURETHRAL RESECTION IN [facility] 2022. I COULD NOT FIND THE SURGICAL PATHOLOGY RESULT ON EPIC. - Refer to [facility] urology for further evaluation and cystoscopy to investigate bladder irritation.             Severe bilateral white matter disease noted on prior MRI in July 2025. findings indicate moderate to severe  generalized atrophy of the brain and extensive abnormal signal in the white matter, consistent with severe bilateral white matter disease. No new stroke identified.  General Health Maintenance Discussed the importance of receiving the flu vaccine. - Administer flu shot.       Body mass index is 29.13 kg/m.SABRA  Assessment and Plan      No orders of the defined types were placed in this encounter.   Orders Placed This Encounter  Procedures   Urine Culture   Flu vaccine HIGH DOSE PF(Fluzone Trivalent)   Ambulatory referral to Urology   POCT URINALYSIS DIP (CLINITEK)     Follow-up: No follow-ups on file.  An After Visit Summary was printed and given to the patient.  Bernadene Garside, MD [facility] [phone]

## 2024-09-19 NOTE — Assessment & Plan Note (Addendum)
 Bladder irritation with recurrent dysuria Recurrent dysuria with no bacterial infection on urine cultures. Previous antibiotics ineffective, suggesting bladder irritation rather than infection.  THERE IS DOCUMENTED history of bladder cancer despite PATIENT NOT REMEMBERING ANYTHING ABOUT THIS.  RECORDS INDICATE HE HAD ANTERIOR BLADDER TUMOR WHICH WAS REMOVED VIA TRANSURETHRAL RESECTION IN [facility] 2022. I COULD NOT FIND THE SURGICAL PATHOLOGY RESULT ON EPIC. - Refer to [facility] urology for further evaluation and cystoscopy to investigate bladder irritation. - Send current urine sample for culture to rule out bacterial infection. - Advise to seek medical attention if high fevers develop. - Avoid unnecessary antibiotic use unless culture indicates bacterial growth. Orders:   POCT URINALYSIS DIP (CLINITEK)   Urine Culture   Ambulatory referral to Urology

## 2024-09-21 LAB — URINE CULTURE: Organism ID, Bacteria: NO GROWTH

## 2024-09-23 ENCOUNTER — Ambulatory Visit: Payer: Self-pay

## 2024-10-02 DIAGNOSIS — R350 Frequency of micturition: Secondary | ICD-10-CM | POA: Diagnosis not present

## 2024-10-02 DIAGNOSIS — N401 Enlarged prostate with lower urinary tract symptoms: Secondary | ICD-10-CM | POA: Diagnosis not present

## 2024-10-02 DIAGNOSIS — R300 Dysuria: Secondary | ICD-10-CM | POA: Diagnosis not present

## 2024-10-07 ENCOUNTER — Ambulatory Visit: Payer: Self-pay

## 2024-10-07 NOTE — Progress Notes (Unsigned)
 Acute Office Visit  Subjective:    Patient ID: Kyle Torres, male    DOB: 09-01-1934, 89 y.o.   MRN: [id number]  No chief complaint on file.   HPI: Patient is in today for ***  Discussed the use of AI scribe software for clinical note transcription with the patient, who gave verbal consent to proceed.  History of Present Illness     Past Medical History:  Diagnosis Date   Arthritis    Asthma    BPH (benign prostatic hypertrophy)    Essential hypertension, malignant 04/22/2020   GERD (gastroesophageal reflux disease)    Hiatal hernia neck pain and back pain    Reflux    Sepsis secondary to UTI (HCC) 10/14/2020   SIRS (systemic inflammatory response syndrome) (HCC) 10/14/2020   Sleep apnea study was [facility]yrs ago at [facility]    Stroke (HCC) 02/2018   rt hand has difficulty with fine movements.    Past Surgical History:  Procedure Laterality Date   ANTERIOR CERVICAL DECOMP/DISCECTOMY FUSION  10/19/2011   Procedure: ANTERIOR CERVICAL DECOMPRESSION/DISCECTOMY FUSION 2 LEVELS;  Surgeon: Reyes JONETTA Budge;  Location: [facility];  Service: Neurosurgery;  Laterality: N/A;  CERVICAL FIVE-SIX, SIX-SEVEN ANTERIOR CERVICAL DECOMPRESSION WITH FUSION AND  INTERBODY PROTHESIS PLATING   CHOLECYSTECTOMY  2010   EYE SURGERY     LUMBAR SPINE SURGERY  05/2020   L4-5 LAMINECTOMY AND FUSION W/ PEDICLE SCREWS, TLIF, POSSIBLE ALLOGRAFT & BONE MARROW ASPIRATION. 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  Tobacco Use   Smoking status: Former    Current packs/day: 0.00    Types: Cigarettes    Quit date: 12/13/1975    Years since quitting: 48.8   Smokeless tobacco: Former    Quit date: 10/10/1983  Vaping Use   Vaping status: Never Used  Substance and Sexual Activity   Alcohol use: No   Drug use: No   Sexual activity: Not on file  Other Topics Concern   Not on file  Social History Narrative   Not on file   Social Drivers of Health   Financial Resource Strain: Low Risk  (11/15/2023)   Overall Financial Resource Strain (CARDIA)    Difficulty of Paying Living Expenses: Not hard at all  Food Insecurity: No Food Insecurity (11/15/2023)   Hunger Vital Sign    Worried About Running Out of Food in the Last Year: Never true    Ran Out of Food in the Last Year: Never true  Transportation Needs: No Transportation Needs (11/15/2023)   PRAPARE - Administrator, Civil Service (Medical): No    Lack of Transportation (Non-Medical): No  Physical Activity: Inactive (11/15/2023)   Exercise Vital Sign    Days of Exercise per Week: 0 days    Minutes of Exercise per Session: 0 min  Stress: No Stress Concern  Present (11/15/2023)   [employer] of Occupational Health - Occupational Stress Questionnaire    Feeling of Stress : Not at all  Social Connections: Socially Isolated (11/15/2023)   Social Connection and Isolation Panel    Frequency of Communication with Friends and Family: More than three times a week    Frequency of Social Gatherings with Friends and Family: Twice a week    Attends Religious Services: Never    Database Administrator or Organizations: No    Attends Banker Meetings: Never    Marital Status: Widowed  Intimate Partner Violence: Not At Risk (11/15/2023)   Humiliation, Afraid, Rape, and Kick questionnaire    Fear of Current or Ex-Partner: No    Emotionally Abused: No    Physically Abused: No    Sexually Abused: No     Outpatient Medications Prior to Visit  Medication Sig Dispense Refill   albuterol  (PROVENTIL  HFA;VENTOLIN  HFA) 108 (90 BASE) MCG/ACT inhaler Inhale 1 puff into the lungs every 6 (six) hours as needed. FOR SHORTNESS OF BREATH     amLODipine  (NORVASC ) 10 MG tablet Take 1 tablet (10 mg total) by mouth daily. 90 tablet 3   cholecalciferol (VITAMIN D3) 25 MCG (1000 UNIT) tablet Take 1,000 Units by mouth daily.     clopidogrel  (PLAVIX ) 75 MG tablet Take 1 tablet (75 mg total) by mouth daily. 90 tablet 3   finasteride  (PROSCAR ) 5 MG tablet Take 5 mg by mouth daily.     fluticasone  (FLONASE ) 50 MCG/ACT nasal spray Place into both nostrils daily.     mometasone  (ASMANEX ) 220 MCG/INH inhaler Inhale 1 puff into the lungs 2 (two) times daily.     Multiple Vitamin (MULTIVITAMIN) tablet Take 1 tablet by mouth daily.     omeprazole (PRILOSEC) 20 MG capsule Take 20 mg by mouth 2 (two) times daily.     rosuvastatin  (CRESTOR ) 20 MG tablet TAKE 1 TABLET(20 MG) BY MOUTH DAILY 90 tablet 2   terazosin  (HYTRIN ) 5 MG capsule Take 5 mg by mouth 2 (two) times daily.     No facility-administered medications prior to visit.    No Known Allergies  Review of Systems     Objective:        09/19/2024    9:29 AM 08/19/2024    2:51 PM 07/25/2024    8:28 AM  Vitals with BMI  Height 5' 10 5' 10 5' 10  Weight 203 lbs 202 lbs 197 lbs  BMI 29.13 28.98 28.27  Systolic 138 142 877  Diastolic 64 78 72  Pulse 92 81 88    No data found.   Physical Exam  Health Maintenance Due  Topic Date Due   Zoster Vaccines- Shingrix (1 of 2) Never done   Medicare Annual Wellness (AWV)  11/14/2024    There are no preventive care reminders to display for this patient.   Lab Results  Component Value Date   TSH 1.29 07/03/2024   Lab Results  Component Value Date   WBC 6.9 04/22/2024   HGB 12.3 (L) 04/22/2024   HCT 36.9 (L) 04/22/2024   MCV 98 (H) 04/22/2024   PLT 218 04/22/2024   Lab Results  Component Value  Date   NA 142 04/22/2024   K 3.9 04/22/2024   CO2 21 04/22/2024   GLUCOSE 90 04/22/2024   BUN 17 04/22/2024   CREATININE 0.82 04/22/2024   BILITOT 0.4 04/22/2024   ALKPHOS 65 04/22/2024   AST 24 04/22/2024   ALT  21 04/22/2024   PROT 6.5 04/22/2024   ALBUMIN 3.8 04/22/2024   CALCIUM  8.8 04/22/2024   ANIONGAP 9 10/16/2020   EGFR 89.0 07/04/2024   Lab Results  Component Value Date   CHOL 104 04/22/2024   Lab Results  Component Value Date   HDL 57 04/22/2024   Lab Results  Component Value Date   LDLCALC 36 04/22/2024   Lab Results  Component Value Date   TRIG 43 04/22/2024   Lab Results  Component Value Date   CHOLHDL 1.8 04/22/2024   No results found for: HGBA1C      Results for orders placed or performed in visit on 09/19/24  POCT URINALYSIS DIP (CLINITEK)   Collection Time: 09/19/24  9:38 AM  Result Value Ref Range   Color, UA yellow yellow   Clarity, UA clear clear   Glucose, UA negative negative mg/dL   Bilirubin, UA negative negative   Ketones, POC UA negative negative mg/dL   Spec Grav, UA >=8.969 (A) 1.010 - 1.025   Blood, UA trace-intact (A) negative   pH, UA 6.0 5.0 - 8.0   POC PROTEIN,UA =30 (A) negative, trace   Urobilinogen, UA 0.2 0.2 or 1.0 E.U./dL   Nitrite, UA Negative Negative   Leukocytes, UA Trace (A) Negative  Urine Culture   Collection Time: 09/19/24 11:11 AM   Specimen: Urine   UR  Result Value Ref Range   Urine Culture, Routine Final report    Organism ID, Bacteria No growth      Assessment & Plan:   Assessment & Plan     There is no height or weight on file to calculate BMI.SABRA  Assessment and Plan Assessment & Plan      No orders of the defined types were placed in this encounter.   No orders of the defined types were placed in this encounter.    Follow-up: No follow-ups on file.  An After Visit Summary was printed and given to the patient.  Abigail Free, MD [facility] [phone]

## 2024-10-07 NOTE — Telephone Encounter (Signed)
 FYI Only or Action Required?: Action required by provider: request for appointment and update on patient condition.  Patient was last seen in primary care on 09/19/2024 by Sirivol, Mamatha, MD.  Called Nurse Triage reporting No chief complaint on file..  Symptoms began a week ago.  Interventions attempted: Nothing.  Symptoms are: gradually worsening.  Triage Disposition: See Physician Within 24 Hours  Patient/caregiver understands and will follow disposition?: Yes  Copied from CRM [id number]. Topic: Clinical - Medication Question >> Oct 07, 2024 10:48 AM Olam RAMAN wrote: Reason for CRM: Pt step son Lynwood Elbe called to see if dr is able tp prescribe a different medication for His bladder. Stated  sulfamethoxazole -trimethoprim  (BACTRIM  DS) 800-160 MG tablet  Sig: Take 1 tablet by mouth 2 (two) times daily is not working and pt went to [facility] last Wednesday and nurse advised no longer a uti he has an enlarged prostate. Pt not able to get any sleep due to frequent RR breaks in the middle of the night. Pt not uncomfortable, just needs sleep CB [id number] Reason for Disposition  Urinating more frequently than usual (i.e., frequency) OR new-onset of the feeling of an urgent need to urinate (i.e., urgency)  Answer Assessment - Initial Assessment Questions 1. SYMPTOM: What's the main symptom you're concerned about? (e.g., frequency, incontinence)     Frequency  2. ONSET: When did the  symptoms  start?     Last week  3. PAIN: Is there any pain? If Yes, ask: How bad is it? (Scale: 1-10; mild, moderate, severe)     No  4. CAUSE: What do you think is causing the symptoms?     Imaging in the hospital revealed an enlarged prostate  5. OTHER SYMPTOMS: Do you have any other symptoms? (e.g., blood in urine, fever, flank pain, pain with urination)     Frequency  Protocols used: Urinary Symptoms-A-AH

## 2024-10-08 ENCOUNTER — Ambulatory Visit (INDEPENDENT_AMBULATORY_CARE_PROVIDER_SITE_OTHER): Admitting: Family Medicine

## 2024-10-08 VITALS — BP 108/60 | HR 114 | Temp 98.0°F | Ht 70.0 in | Wt 202.0 lb

## 2024-10-08 DIAGNOSIS — N201 Calculus of ureter: Secondary | ICD-10-CM | POA: Diagnosis not present

## 2024-10-08 DIAGNOSIS — R3129 Other microscopic hematuria: Secondary | ICD-10-CM | POA: Diagnosis not present

## 2024-10-08 DIAGNOSIS — N401 Enlarged prostate with lower urinary tract symptoms: Secondary | ICD-10-CM

## 2024-10-08 DIAGNOSIS — R351 Nocturia: Secondary | ICD-10-CM

## 2024-10-08 DIAGNOSIS — Z23 Encounter for immunization: Secondary | ICD-10-CM | POA: Diagnosis not present

## 2024-10-08 LAB — POCT URINALYSIS DIP (CLINITEK)
Bilirubin, UA: NEGATIVE
Glucose, UA: NEGATIVE mg/dL
Ketones, POC UA: NEGATIVE mg/dL
Leukocytes, UA: NEGATIVE
Nitrite, UA: NEGATIVE
Spec Grav, UA: 1.02 (ref 1.010–1.025)
Urobilinogen, UA: 0.2 U/dL
pH, UA: 6 (ref 5.0–8.0)

## 2024-10-08 MED ORDER — TAMSULOSIN HCL 0.4 MG PO CAPS: 0.8000 mg | ORAL_CAPSULE | Freq: Every day | ORAL | 3 refills | Status: AC

## 2024-10-08 NOTE — Assessment & Plan Note (Signed)
  Orders:   POCT URINALYSIS DIP (CLINITEK)   PSA

## 2024-10-08 NOTE — Patient Instructions (Signed)
 Stop terazosin . Start tamsulosin  0.4 mg 2 at bedtime. Continue finasteride .  Start otc sal palmetto supplement.

## 2024-10-09 DIAGNOSIS — N201 Calculus of ureter: Secondary | ICD-10-CM | POA: Insufficient documentation

## 2024-10-09 LAB — PSA: Prostate Specific Ag, Serum: 1.5 ng/mL (ref 0.0–4.0)

## 2024-10-09 NOTE — Assessment & Plan Note (Signed)
  Orders:   Urine Culture

## 2024-10-09 NOTE — Assessment & Plan Note (Signed)
 2 mm left ureteral calculus identified on CT scan near the bladder, not causing significant symptoms or contributing to current urinary issues.

## 2024-10-10 ENCOUNTER — Telehealth: Payer: Self-pay | Admitting: Family Medicine

## 2024-10-10 ENCOUNTER — Ambulatory Visit: Payer: Self-pay | Admitting: Family Medicine

## 2024-10-10 LAB — URINE CULTURE

## 2024-10-10 NOTE — Telephone Encounter (Signed)
 Returned call patient [facility]. Aware to call our office with any other issues or concerns.

## 2024-10-10 NOTE — Telephone Encounter (Unsigned)
 Copied from CRM #[id number]. Topic: Clinical - Lab/Test Results >> Oct 10, 2024  7:38 AM Tiffini S wrote: Reason for CRM: Patient have questions about have PSA labs and asked for a call back at [phone].

## 2024-10-11 ENCOUNTER — Encounter: Payer: Self-pay | Admitting: Family Medicine

## 2024-10-27 ENCOUNTER — Other Ambulatory Visit: Payer: Self-pay | Admitting: Family Medicine

## 2024-10-27 DIAGNOSIS — R270 Ataxia, unspecified: Secondary | ICD-10-CM

## 2024-11-19 ENCOUNTER — Ambulatory Visit: Admitting: Family Medicine

## 2024-12-10 ENCOUNTER — Ambulatory Visit (INDEPENDENT_AMBULATORY_CARE_PROVIDER_SITE_OTHER)

## 2024-12-10 VITALS — BP 110/60 | HR 76 | Temp 97.5°F | Ht 70.0 in | Wt 203.4 lb

## 2024-12-10 DIAGNOSIS — Z0000 Encounter for general adult medical examination without abnormal findings: Secondary | ICD-10-CM

## 2024-12-10 DIAGNOSIS — M1990 Unspecified osteoarthritis, unspecified site: Secondary | ICD-10-CM | POA: Insufficient documentation

## 2024-12-10 NOTE — Patient Instructions (Signed)
 Kyle Torres , Thank you for taking time to come for your Medicare Wellness Visit. I appreciate your ongoing commitment to your health goals. Please review the following plan we discussed and let me know if I can assist you in the future.   These are the goals we discussed:  Goals       Patient Stated (pt-stated)      Patient states he would like to live another year.      Prevent Falls and Injury         - always use handrails on the stairs - keep my cell phone with me always - learn how to get back up if I fall - make an emergency alert plan in case I fall - pick up clutter from the floors - use a cane or walker - use a nightlight in the bathroom - wear my glasses and/or hearing aid    Why is this important?   Most falls happen when it is hard for you to walk safely. Your balance may be off because of an illness. You may have pain in your knees, hip or other joints.  You may be overly tired or taking medicines that make you sleepy. You may not be able to see or hear clearly.  Falls can lead to broken bones, bruises or other injuries.  There are things you can do to help prevent falling.     Notes:         This is a list of the screening recommended for you and due dates:  Health Maintenance  Topic Date Due   Zoster (Shingles) Vaccine (1 of 2) Never done   Medicare Annual Wellness Visit  12/10/2025   DTaP/Tdap/Td vaccine (2 - Td or Tdap) 06/29/2026   Pneumococcal Vaccine for age over 1  Completed   Flu Shot  Completed   Meningitis B Vaccine  Aged Out   COVID-19 Vaccine  Discontinued    Advanced directives: yes  Next appointment: Follow up in one year for your annual wellness visit. Patient would like to wait and make appointment closer to time.  Preventive Care 59 Years and Older, Male  Preventive care refers to lifestyle choices and visits with your health care provider that can promote health and wellness. What does preventive care include? A yearly physical  exam. This is also called an annual well check. Dental exams once or twice a year. Routine eye exams. Ask your health care provider how often you should have your eyes checked. Personal lifestyle choices, including: Daily care of your teeth and gums. Regular physical activity. Eating a healthy diet. Avoiding tobacco and drug use. Limiting alcohol use. Practicing safe sex. Taking low doses of aspirin  every day. Taking vitamin and mineral supplements as recommended by your health care provider. What happens during an annual well check? The services and screenings done by your health care provider during your annual well check will depend on your age, overall health, lifestyle risk factors, and family history of disease. Counseling  Your health care provider may ask you questions about your: Alcohol use. Tobacco use. Drug use. Emotional well-being. Home and relationship well-being. Sexual activity. Eating habits. History of falls. Memory and ability to understand (cognition). Work and work astronomer. Screening  You may have the following tests or measurements: Height, weight, and BMI. Blood pressure. Lipid and cholesterol levels. These may be checked every 5 years, or more frequently if you are over 88 years old. Skin check. Lung cancer screening. You  may have this screening every year starting at age 88 if you have a 30-pack-year history of smoking and currently smoke or have quit within the past 15 years. Fecal occult blood test (FOBT) of the stool. You may have this test every year starting at age 88. Flexible sigmoidoscopy or colonoscopy. You may have a sigmoidoscopy every 5 years or a colonoscopy every 10 years starting at age 88. Prostate cancer screening. Recommendations will vary depending on your family history and other risks. Hepatitis C blood test. Hepatitis B blood test. Sexually transmitted disease (STD) testing. Diabetes screening. This is done by checking your  blood sugar (glucose) after you have not eaten for a while (fasting). You may have this done every 1-3 years. Abdominal aortic aneurysm (AAA) screening. You may need this if you are a current or former smoker. Osteoporosis. You may be screened starting at age 88 if you are at high risk. Talk with your health care provider about your test results, treatment options, and if necessary, the need for more tests. Vaccines  Your health care provider may recommend certain vaccines, such as: Influenza vaccine. This is recommended every year. Tetanus, diphtheria, and acellular pertussis (Tdap, Td) vaccine. You may need a Td booster every 10 years. Zoster vaccine. You may need this after age 88. Pneumococcal 13-valent conjugate (PCV13) vaccine. One dose is recommended after age 88. Pneumococcal polysaccharide (PPSV23) vaccine. One dose is recommended after age 88. Talk to your health care provider about which screenings and vaccines you need and how often you need them. This information is not intended to replace advice given to you by your health care provider. Make sure you discuss any questions you have with your health care provider. Document Released: 12/25/2015 Document Revised: 08/17/2016 Document Reviewed: 09/29/2015 Elsevier Interactive Patient Education  2017 [employer].  Fall Prevention in the Home Falls can cause injuries. They can happen to people of all ages. There are many things you can do to make your home safe and to help prevent falls. What can I do on the outside of my home? Regularly fix the edges of walkways and driveways and fix any cracks. Remove anything that might make you trip as you walk through a door, such as a raised step or threshold. Trim any bushes or trees on the path to your home. Use bright outdoor lighting. Clear any walking paths of anything that might make someone trip, such as rocks or tools. Regularly check to see if handrails are loose or broken. Make sure  that both sides of any steps have handrails. Any raised decks and porches should have guardrails on the edges. Have any leaves, snow, or ice cleared regularly. Use sand or salt on walking paths during winter. Clean up any spills in your garage right away. This includes oil or grease spills. What can I do in the bathroom? Use night lights. Install grab bars by the toilet and in the tub and shower. Do not use towel bars as grab bars. Use non-skid mats or decals in the tub or shower. If you need to sit down in the shower, use a plastic, non-slip stool. Keep the floor dry. Clean up any water that spills on the floor as soon as it happens. Remove soap buildup in the tub or shower regularly. Attach bath mats securely with double-sided non-slip rug tape. Do not have throw rugs and other things on the floor that can make you trip. What can I do in the bedroom? Use night lights. Make  sure that you have a light by your bed that is easy to reach. Do not use any sheets or blankets that are too big for your bed. They should not hang down onto the floor. Have a firm chair that has side arms. You can use this for support while you get dressed. Do not have throw rugs and other things on the floor that can make you trip. What can I do in the kitchen? Clean up any spills right away. Avoid walking on wet floors. Keep items that you use a lot in easy-to-reach places. If you need to reach something above you, use a strong step stool that has a grab bar. Keep electrical cords out of the way. Do not use floor polish or wax that makes floors slippery. If you must use wax, use non-skid floor wax. Do not have throw rugs and other things on the floor that can make you trip. What can I do with my stairs? Do not leave any items on the stairs. Make sure that there are handrails on both sides of the stairs and use them. Fix handrails that are broken or loose. Make sure that handrails are as long as the  stairways. Check any carpeting to make sure that it is firmly attached to the stairs. Fix any carpet that is loose or worn. Avoid having throw rugs at the top or bottom of the stairs. If you do have throw rugs, attach them to the floor with carpet tape. Make sure that you have a light switch at the top of the stairs and the bottom of the stairs. If you do not have them, ask someone to add them for you. What else can I do to help prevent falls? Wear shoes that: Do not have high heels. Have rubber bottoms. Are comfortable and fit you well. Are closed at the toe. Do not wear sandals. If you use a stepladder: Make sure that it is fully opened. Do not climb a closed stepladder. Make sure that both sides of the stepladder are locked into place. Ask someone to hold it for you, if possible. Clearly mark and make sure that you can see: Any grab bars or handrails. First and last steps. Where the edge of each step is. Use tools that help you move around (mobility aids) if they are needed. These include: Canes. Walkers. Scooters. Crutches. Turn on the lights when you go into a dark area. Replace any light bulbs as soon as they burn out. Set up your furniture so you have a clear path. Avoid moving your furniture around. If any of your floors are uneven, fix them. If there are any pets around you, be aware of where they are. Review your medicines with your doctor. Some medicines can make you feel dizzy. This can increase your chance of falling. Ask your doctor what other things that you can do to help prevent falls. This information is not intended to replace advice given to you by your health care provider. Make sure you discuss any questions you have with your health care provider. Document Released: 09/24/2009 Document Revised: 05/05/2016 Document Reviewed: 01/02/2015 Elsevier Interactive Patient Education  2017 [employer].

## 2024-12-10 NOTE — Progress Notes (Signed)
 "  Chief Complaint  Patient presents with   Annual Exam     Subjective:   Kyle Torres is a 89 y.o. male who presents for a Medicare Annual Wellness Visit.  Visit info / Clinical Intake: Medicare Wellness Visit Type:: Subsequent Annual Wellness Visit Persons participating in visit and providing information:: patient Medicare Wellness Visit Mode:: In-person (required for WTM) Interpreter Needed?: No Pre-visit prep was completed: no AWV questionnaire completed by patient prior to visit?: no Living arrangements:: (!) lives alone Patient's Overall Health Status Rating: very good Typical amount of pain: none Does pain affect daily life?: no Are you currently prescribed opioids?: no  Dietary Habits and Nutritional Risks How many meals a day?: 2 Eats fruit and vegetables daily?: (!) no Most meals are obtained by: preparing own meals In the last 2 weeks, have you had any of the following?: none Diabetic:: no  Functional Status Activities of Daily Living (to include ambulation/medication): Independent Ambulation: Independent with device- listed below Home Assistive Devices/Equipment: Cane Medication Administration: Independent Home Management (perform basic housework or laundry): Independent Manage your own finances?: yes Primary transportation is: driving Concerns about vision?: no *vision screening is required for WTM* (has glasses) Concerns about hearing?: no (hearing aids)  Fall Screening Falls in the past year?: 0 Number of falls in past year: 0 Was there an injury with Fall?: 0 Fall Risk Category Calculator: 0 Patient Fall Risk Level: Low Fall Risk  Fall Risk Patient at Risk for Falls Due to: No Fall Risks Fall risk Follow up: Falls evaluation completed  Home and Transportation Safety: All rugs have non-skid backing?: N/A, no rugs All stairs or steps have railings?: yes Grab bars in the bathtub or shower?: yes Have non-skid surface in bathtub or shower?:  yes Good home lighting?: yes Regular seat belt use?: yes Hospital stays in the last year:: no  Cognitive Assessment Difficulty concentrating, remembering, or making decisions? : no Will 6CIT or Mini Cog be Completed: yes What year is it?: 0 points What month is it?: 0 points Give patient an address phrase to remember (5 components): [address] [place] what time is it?: 0 points Count backwards from 20 to 1: 2 points Say the months of the year in reverse: 0 points Repeat the address phrase from earlier: 0 points 6 CIT Score: 2 points  Advance Directives (For Healthcare) Does Patient Have a Medical Advance Directive?: Yes Type of Advance Directive: Healthcare Power of [place]; Living will Copy of Healthcare Power of Attorney in Chart?: No - copy requested Copy of Living Will in Chart?: No - copy requested  Reviewed/Updated  Reviewed/Updated: Reviewed All (Medical, Surgical, Family, Medications, Allergies, Care Teams, Patient Goals)    Allergies (verified) Patient has no known allergies.   Current Medications (verified) Outpatient Encounter Medications as of 12/10/2024  Medication Sig   albuterol  (PROVENTIL  HFA;VENTOLIN  HFA) 108 (90 BASE) MCG/ACT inhaler Inhale 1 puff into the lungs every 6 (six) hours as needed. FOR SHORTNESS OF BREATH   amLODipine  (NORVASC ) 10 MG tablet Take 1 tablet (10 mg total) by mouth daily.   cholecalciferol (VITAMIN D3) 25 MCG (1000 UNIT) tablet Take 1,000 Units by mouth daily.   clopidogrel  (PLAVIX ) 75 MG tablet TAKE 1 TABLET(75 MG) BY MOUTH DAILY   fluticasone  (FLONASE ) 50 MCG/ACT nasal spray Place into both nostrils daily.   mometasone  (ASMANEX ) 220 MCG/INH inhaler Inhale 1 puff into the lungs 2 (two) times daily.   Multiple Vitamin (MULTIVITAMIN) tablet Take 1 tablet by  mouth daily.   omeprazole (PRILOSEC) 20 MG capsule Take 20 mg by mouth 2 (two) times daily.   oxybutynin (DITROPAN-XL) 5 MG 24 hr tablet Take 10 mg by mouth at bedtime.    rosuvastatin  (CRESTOR ) 20 MG tablet TAKE 1 TABLET(20 MG) BY MOUTH DAILY   tamsulosin  (FLOMAX ) 0.4 MG CAPS capsule Take 2 capsules (0.8 mg total) by mouth daily after supper.   [DISCONTINUED] finasteride  (PROSCAR ) 5 MG tablet Take 5 mg by mouth daily.   No facility-administered encounter medications on file as of 12/10/2024.    History: Past Medical History:  Diagnosis Date   Arthritis    Asthma    BPH (benign prostatic hypertrophy)    Essential hypertension, malignant 04/22/2020   GERD (gastroesophageal reflux disease)    Hiatal hernia neck pain and back pain    Reflux    Sepsis secondary to UTI (HCC) 10/14/2020   SIRS (systemic inflammatory response syndrome) (HCC) 10/14/2020   Sleep apnea study was [facility]yrs ago at [facility]    Stroke (HCC) 02/2018   rt hand has difficulty with fine movements.   Past Surgical History:  Procedure Laterality Date   ANTERIOR CERVICAL DECOMP/DISCECTOMY FUSION  10/19/2011   Procedure: ANTERIOR CERVICAL DECOMPRESSION/DISCECTOMY FUSION 2 LEVELS;  Surgeon: Reyes JONETTA Budge;  Location: [facility];  Service: Neurosurgery;  Laterality: N/A;  CERVICAL FIVE-SIX, SIX-SEVEN ANTERIOR CERVICAL DECOMPRESSION WITH FUSION AND  INTERBODY PROTHESIS PLATING   CHOLECYSTECTOMY  2010   EYE SURGERY     LUMBAR SPINE SURGERY  05/2020   L4-5 LAMINECTOMY AND FUSION W/ PEDICLE SCREWS, TLIF, POSSIBLE ALLOGRAFT & BONE MARROW ASPIRATION. 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  Tobacco Use   Smoking status: Former    Current packs/day: 0.00    Types: Cigarettes    Quit date: 12/13/1975    Years since quitting: 49.0   Smokeless tobacco: Former    Quit  date: 10/10/1983  Vaping Use   Vaping status: Never Used  Substance and Sexual Activity   Alcohol use: No   Drug use: No   Sexual activity: Not on file   Tobacco Counseling Counseling given: Not Answered  SDOH Screenings   Food Insecurity: No Food Insecurity (12/10/2024)  Housing: Unknown (12/10/2024)  Transportation Needs: No Transportation Needs (12/10/2024)  Utilities: Not At Risk (12/10/2024)  Alcohol Screen: Low Risk (11/15/2023)  Depression (PHQ2-9): Low Risk (12/10/2024)  Financial Resource Strain: Low Risk (11/15/2023)  Physical Activity: Inactive (12/10/2024)  Social Connections: Socially Isolated (12/10/2024)  Stress: No Stress Concern Present (12/10/2024)  Tobacco Use: Medium Risk (12/10/2024)  Health Literacy: Adequate Health Literacy (12/10/2024)   See flowsheets for full screening details  Depression Screen PHQ 2 & 9 Depression Scale- Over the past 2 weeks, how often have you been bothered by any of the following problems? Little interest or pleasure in doing things: 0 Feeling down, depressed, or hopeless (PHQ Adolescent also includes...irritable): 0 PHQ-2 Total Score: 0 Trouble falling or staying asleep, or sleeping too much: 0 Feeling tired or having little energy: 0 Poor appetite or overeating (PHQ Adolescent also includes...weight loss): 0 Feeling bad about  yourself - or that you are a failure or have let yourself or your family down: 0 Trouble concentrating on things, such as reading the newspaper or watching television (PHQ Adolescent also includes...like school work): 0 Moving or speaking so slowly that other people could have noticed. Or the opposite - being so fidgety or restless that you have been moving around a lot more than usual: 0 Thoughts that you would be better off dead, or of hurting yourself in some way: 0 PHQ-9 Total Score: 0 If you checked off any problems, how difficult have these problems made it for you to do your work, take care of things  at home, or get along with other people?: Not difficult at all  Depression Treatment Depression Interventions/Treatment : [id number] Score <4 Follow-up Not Indicated     Goals Addressed               This Visit's Progress     Patient Stated (pt-stated)        Patient states he would like to live another year.             Objective:    Today's Vitals   12/10/24 1339  BP: 110/60  Pulse: 76  Temp: (!) 97.5 F (36.4 C)  SpO2: 97%  Weight: 203 lb 6.4 oz (92.3 kg)  Height: 5' 10 (1.778 m)   Body mass index is 29.18 kg/m.  Hearing/Vision screen No results found. Immunizations and Health Maintenance Health Maintenance  Topic Date Due   Zoster Vaccines- Shingrix (1 of 2) Never done   Medicare Annual Wellness (AWV)  12/10/2025   DTaP/Tdap/Td (2 - Td or Tdap) 06/29/2026   Pneumococcal Vaccine: 50+ Years  Completed   Influenza Vaccine  Completed   Meningococcal B Vaccine  Aged Out   COVID-19 Vaccine  Discontinued        Assessment/Plan:  This is a routine wellness examination for [place].  Patient Care Team: Sherre Clapper, MD as PCP - General (Family Medicine)  I have personally reviewed and noted the following in the patients chart:   Medical and social history Use of alcohol, tobacco or illicit drugs  Current medications and supplements including opioid prescriptions. Functional ability and status Nutritional status Physical activity Advanced directives List of other physicians Hospitalizations, surgeries, and ER visits in previous 12 months Vitals Screenings to include cognitive, depression, and falls Referrals and appointments  No orders of the defined types were placed in this encounter.  In addition, I have reviewed and discussed with patient certain preventive protocols, quality metrics, and best practice recommendations. A written personalized care plan for preventive services as well as general preventive health recommendations were provided to  patient.   [place] Mailman, [place]   12/10/2024   Return in 1 year (on 12/10/2025).  After Visit Summary: (In Person-Declined) Patient declined AVS at this time.  Nurse Notes: I spent 30 minutes with patient. Patient is hard of hearing. Patient has no questions or concerns at this time.  "

## 2025-01-27 ENCOUNTER — Ambulatory Visit: Admitting: Family Medicine
